# Patient Record
Sex: Female | Born: 1950 | Race: White | Hispanic: No | Smoking: Former smoker
Health system: Southern US, Community
[De-identification: ages and names within clinical notes are randomized; demographics above are authoritative.]

## PROBLEM LIST (undated history)

## (undated) DIAGNOSIS — K219 Gastro-esophageal reflux disease without esophagitis: Secondary | ICD-10-CM

## (undated) DIAGNOSIS — M549 Dorsalgia, unspecified: Secondary | ICD-10-CM

## (undated) DIAGNOSIS — F329 Major depressive disorder, single episode, unspecified: Secondary | ICD-10-CM

## (undated) DIAGNOSIS — T7840XA Allergy, unspecified, initial encounter: Secondary | ICD-10-CM

## (undated) DIAGNOSIS — G8929 Other chronic pain: Secondary | ICD-10-CM

## (undated) DIAGNOSIS — E669 Obesity, unspecified: Secondary | ICD-10-CM

## (undated) DIAGNOSIS — F419 Anxiety disorder, unspecified: Secondary | ICD-10-CM

## (undated) DIAGNOSIS — G4700 Insomnia, unspecified: Secondary | ICD-10-CM

## (undated) DIAGNOSIS — F32A Depression, unspecified: Secondary | ICD-10-CM

## (undated) DIAGNOSIS — E079 Disorder of thyroid, unspecified: Secondary | ICD-10-CM

## (undated) HISTORY — DX: Obesity, unspecified: E66.9

## (undated) HISTORY — PX: COLONOSCOPY: SHX174

## (undated) HISTORY — DX: Anxiety disorder, unspecified: F41.9

## (undated) HISTORY — DX: Dorsalgia, unspecified: M54.9

## (undated) HISTORY — DX: Disorder of thyroid, unspecified: E07.9

## (undated) HISTORY — DX: Other chronic pain: G89.29

## (undated) HISTORY — DX: Major depressive disorder, single episode, unspecified: F32.9

## (undated) HISTORY — DX: Gastro-esophageal reflux disease without esophagitis: K21.9

## (undated) HISTORY — DX: Insomnia, unspecified: G47.00

## (undated) HISTORY — PX: TUBAL LIGATION: SHX77

## (undated) HISTORY — DX: Allergy, unspecified, initial encounter: T78.40XA

## (undated) HISTORY — PX: SMALL INTESTINE SURGERY: SHX150

## (undated) HISTORY — DX: Depression, unspecified: F32.A

---

## 1992-01-24 HISTORY — PX: ABDOMINAL HYSTERECTOMY: SHX81

## 1997-07-22 ENCOUNTER — Other Ambulatory Visit: Admission: RE | Admit: 1997-07-22 | Discharge: 1997-07-22 | Payer: Self-pay | Admitting: Gynecology

## 1998-01-23 HISTORY — PX: GASTRIC BYPASS: SHX52

## 1998-01-23 HISTORY — PX: EYE SURGERY: SHX253

## 1998-01-23 HISTORY — PX: CHOLECYSTECTOMY: SHX55

## 1998-09-21 ENCOUNTER — Other Ambulatory Visit: Admission: RE | Admit: 1998-09-21 | Discharge: 1998-09-21 | Payer: Self-pay | Admitting: Gynecology

## 1999-10-27 ENCOUNTER — Encounter: Payer: Self-pay | Admitting: Gynecology

## 1999-10-27 ENCOUNTER — Encounter: Admission: RE | Admit: 1999-10-27 | Discharge: 1999-10-27 | Payer: Self-pay | Admitting: Gynecology

## 1999-10-27 ENCOUNTER — Other Ambulatory Visit: Admission: RE | Admit: 1999-10-27 | Discharge: 1999-10-27 | Payer: Self-pay | Admitting: Gynecology

## 2000-01-24 HISTORY — PX: EYE SURGERY: SHX253

## 2000-12-14 ENCOUNTER — Other Ambulatory Visit: Admission: RE | Admit: 2000-12-14 | Discharge: 2000-12-14 | Payer: Self-pay | Admitting: Gynecology

## 2001-12-10 ENCOUNTER — Other Ambulatory Visit: Admission: RE | Admit: 2001-12-10 | Discharge: 2001-12-10 | Payer: Self-pay | Admitting: Gynecology

## 2001-12-10 ENCOUNTER — Encounter: Admission: RE | Admit: 2001-12-10 | Discharge: 2001-12-10 | Payer: Self-pay | Admitting: Gynecology

## 2001-12-10 ENCOUNTER — Encounter: Payer: Self-pay | Admitting: Gynecology

## 2002-12-04 ENCOUNTER — Other Ambulatory Visit: Admission: RE | Admit: 2002-12-04 | Discharge: 2002-12-04 | Payer: Self-pay | Admitting: Gynecology

## 2002-12-12 ENCOUNTER — Encounter: Admission: RE | Admit: 2002-12-12 | Discharge: 2002-12-12 | Payer: Self-pay | Admitting: Gynecology

## 2005-01-31 ENCOUNTER — Other Ambulatory Visit: Admission: RE | Admit: 2005-01-31 | Discharge: 2005-01-31 | Payer: Self-pay | Admitting: Gynecology

## 2005-02-20 ENCOUNTER — Encounter: Admission: RE | Admit: 2005-02-20 | Discharge: 2005-02-20 | Payer: Self-pay | Admitting: Gynecology

## 2006-03-06 ENCOUNTER — Encounter: Admission: RE | Admit: 2006-03-06 | Discharge: 2006-03-06 | Payer: Self-pay | Admitting: Gynecology

## 2006-03-12 ENCOUNTER — Other Ambulatory Visit: Admission: RE | Admit: 2006-03-12 | Discharge: 2006-03-12 | Payer: Self-pay | Admitting: Gynecology

## 2007-03-18 ENCOUNTER — Emergency Department: Payer: Self-pay | Admitting: Emergency Medicine

## 2007-05-09 ENCOUNTER — Other Ambulatory Visit: Admission: RE | Admit: 2007-05-09 | Discharge: 2007-05-09 | Payer: Self-pay | Admitting: Gynecology

## 2007-05-09 ENCOUNTER — Encounter: Admission: RE | Admit: 2007-05-09 | Discharge: 2007-05-09 | Payer: Self-pay | Admitting: Gynecology

## 2007-05-21 ENCOUNTER — Encounter: Payer: Self-pay | Admitting: Specialist

## 2007-05-24 ENCOUNTER — Encounter: Payer: Self-pay | Admitting: Specialist

## 2007-06-24 ENCOUNTER — Encounter: Payer: Self-pay | Admitting: Specialist

## 2007-12-27 ENCOUNTER — Ambulatory Visit: Payer: Self-pay | Admitting: Family Medicine

## 2008-01-24 HISTORY — PX: GASTRIC BYPASS: SHX52

## 2008-05-11 ENCOUNTER — Encounter: Admission: RE | Admit: 2008-05-11 | Discharge: 2008-05-11 | Payer: Self-pay | Admitting: Gynecology

## 2009-05-25 ENCOUNTER — Encounter: Admission: RE | Admit: 2009-05-25 | Discharge: 2009-05-25 | Payer: Self-pay | Admitting: Gynecology

## 2009-06-02 ENCOUNTER — Encounter: Admission: RE | Admit: 2009-06-02 | Discharge: 2009-06-02 | Payer: Self-pay | Admitting: Gynecology

## 2010-06-02 LAB — HM DEXA SCAN: HM Dexa Scan: NORMAL

## 2011-12-11 LAB — HEMOGLOBIN A1C: HEMOGLOBIN A1C: 5.4 % (ref 4.0–6.0)

## 2012-01-10 ENCOUNTER — Ambulatory Visit: Payer: Self-pay | Admitting: Family Medicine

## 2012-01-24 ENCOUNTER — Ambulatory Visit: Payer: Self-pay | Admitting: Family Medicine

## 2012-03-19 ENCOUNTER — Ambulatory Visit: Payer: Self-pay | Admitting: Family Medicine

## 2012-06-13 ENCOUNTER — Ambulatory Visit: Payer: Self-pay | Admitting: Family Medicine

## 2012-07-19 ENCOUNTER — Ambulatory Visit: Payer: Self-pay | Admitting: Family Medicine

## 2012-07-31 LAB — HM COLONOSCOPY

## 2012-08-01 ENCOUNTER — Ambulatory Visit: Payer: Self-pay | Admitting: Emergency Medicine

## 2013-07-01 ENCOUNTER — Ambulatory Visit (INDEPENDENT_AMBULATORY_CARE_PROVIDER_SITE_OTHER): Payer: Federal, State, Local not specified - PPO

## 2013-07-01 ENCOUNTER — Encounter: Payer: Self-pay | Admitting: Podiatry

## 2013-07-01 ENCOUNTER — Ambulatory Visit (INDEPENDENT_AMBULATORY_CARE_PROVIDER_SITE_OTHER): Payer: Federal, State, Local not specified - PPO | Admitting: Podiatry

## 2013-07-01 VITALS — BP 102/65 | HR 96 | Resp 16 | Ht 63.0 in | Wt 215.0 lb

## 2013-07-01 DIAGNOSIS — M722 Plantar fascial fibromatosis: Secondary | ICD-10-CM

## 2013-07-01 MED ORDER — DICLOFENAC SODIUM 75 MG PO TBEC: 75.0000 mg | DELAYED_RELEASE_TABLET | Freq: Two times a day (BID) | ORAL | Status: DC

## 2013-07-01 MED ORDER — TRIAMCINOLONE ACETONIDE 10 MG/ML IJ SUSP
10.0000 mg | Freq: Once | INTRAMUSCULAR | Status: AC
  Administered 2013-07-01: 10 mg

## 2013-07-01 NOTE — Patient Instructions (Signed)

## 2013-07-01 NOTE — Progress Notes (Signed)
   Subjective:    Patient ID: Terri Castillo, female    DOB: 1950-01-30, 63 y.o.   MRN: [id number]  HPI Comments: i have heel pain in my right foot and last week i fell and hurt my rt foot. Its still sore. i wore a brace on my foot but it makes the foot hurt more. The heel feels like a stone bruise.its aggravating to walk. i did elevate and put ice on it. The foot has gotten better.   Foot Pain      Review of Systems  Allergic/Immunologic: Positive for environmental allergies.  Hematological: Bruises/bleeds easily.  All other systems reviewed and are negative.      Objective:   Physical Exam        Assessment & Plan:

## 2013-07-01 NOTE — Progress Notes (Signed)
Subjective:     Patient ID: Terri Castillo, female   DOB: [birth year], 63 y.o.   MRN: [id number]  Foot Pain   patient presents stating that she has been having a lot of pain in her right heel and it's been getting gradually worse over the last month and she tripped and hurt the top of her foot but it's not as bad as the heel   Review of Systems  All other systems reviewed and are negative.      Objective:   Physical Exam  Nursing note and vitals reviewed. Constitutional: She is oriented to person, place, and time.  Cardiovascular: Intact distal pulses.   Musculoskeletal: Normal range of motion.  Neurological: She is oriented to person, place, and time.  Skin: Skin is warm.   neurovascular status intact with range of motion of the subtalar midtarsal joint adequate and muscle strength adequate. Patient is found to have discomfort in the plantar right heel at the insertion of the tendon to the calcaneus and mild discomfort dorsum of the foot upon palpation     Assessment:     Plantar fasciitis right with inflammation and mild dorsal tendinitis    Plan:     H&P and x-ray performed. Injected the right plantar fascia 3 mg Kenalog 5 mg Xylocaine Marcaine mixture and dispensed fascial brace with instructions. Placed on diclofenac 75 mg twice a day and reappoint in 2 weeks

## 2013-07-08 ENCOUNTER — Ambulatory Visit (INDEPENDENT_AMBULATORY_CARE_PROVIDER_SITE_OTHER): Payer: Federal, State, Local not specified - PPO | Admitting: Podiatry

## 2013-07-08 VITALS — BP 114/68 | HR 74 | Resp 16 | Ht 63.0 in | Wt 215.0 lb

## 2013-07-08 DIAGNOSIS — M722 Plantar fascial fibromatosis: Secondary | ICD-10-CM

## 2013-07-08 NOTE — Progress Notes (Signed)
Subjective:     Patient ID: Terri SabinsPatricia S Castillo, female   DOB: 1950/08/14, 63 y.o.   MRN: [id number]  HPI patient states the right foot is feeling quite a bit better but still sore with activity but she's able to walk first thing in the morning without intense discomfort   Review of Systems     Objective:   Physical Exam  neurovascular status intact with continued discomfort in the right plantar heel at the insertion to the calcaneus but improved from previous visit    Assessment:     Improving plantar fasciitis right    Plan:     Instructed on continued conservative treatment and recommended long-term orthotics. Patient is scanned for customized orthotic devices at this time to reduce stress against the feet

## 2013-07-15 ENCOUNTER — Encounter: Payer: Self-pay | Admitting: *Deleted

## 2013-07-15 NOTE — Progress Notes (Signed)
Sent pt post card letting her know orthotics are here. 

## 2013-07-18 ENCOUNTER — Encounter: Payer: Self-pay | Admitting: Podiatry

## 2013-09-25 ENCOUNTER — Other Ambulatory Visit: Payer: Self-pay | Admitting: Podiatry

## 2013-11-04 LAB — LIPID PANEL
CHOLESTEROL: 136 mg/dL (ref 0–200)
HDL: 42 mg/dL (ref 35–70)
LDL CALC: 63 mg/dL
Triglycerides: 154 mg/dL (ref 40–160)

## 2013-11-11 ENCOUNTER — Ambulatory Visit: Payer: Self-pay | Admitting: Family Medicine

## 2013-11-11 LAB — HM MAMMOGRAPHY: HM MAMMO: NORMAL

## 2014-02-12 ENCOUNTER — Encounter: Payer: Self-pay | Admitting: Family Medicine

## 2014-02-23 ENCOUNTER — Encounter: Payer: Self-pay | Admitting: Family Medicine

## 2014-03-24 ENCOUNTER — Encounter
Admit: 2014-03-24 | Disposition: A | Discharge status: Home / Self Care | Payer: Self-pay | Attending: Family Medicine | Admitting: Family Medicine

## 2014-04-24 ENCOUNTER — Encounter
Admit: 2014-04-24 | Disposition: A | Discharge status: Home / Self Care | Payer: Self-pay | Attending: Family Medicine | Admitting: Family Medicine

## 2014-07-01 ENCOUNTER — Telehealth: Payer: Self-pay

## 2014-07-01 NOTE — Telephone Encounter (Signed)
Patient call states she has been angry and snappy, wants to go back on lexapro 20mg  and stop everything else?

## 2014-07-01 NOTE — Telephone Encounter (Signed)
Can she come back for medication change?

## 2014-07-01 NOTE — Telephone Encounter (Signed)
Please call patient for appointment for depression medication change

## 2014-07-02 ENCOUNTER — Ambulatory Visit (INDEPENDENT_AMBULATORY_CARE_PROVIDER_SITE_OTHER): Payer: Federal, State, Local not specified - PPO | Admitting: Family Medicine

## 2014-07-02 ENCOUNTER — Encounter: Payer: Self-pay | Admitting: Family Medicine

## 2014-07-02 VITALS — BP 124/82 | HR 88 | Temp 98.6°F | Resp 14 | Ht 63.5 in | Wt 211.5 lb

## 2014-07-02 DIAGNOSIS — J3089 Other allergic rhinitis: Secondary | ICD-10-CM

## 2014-07-02 DIAGNOSIS — N905 Atrophy of vulva: Secondary | ICD-10-CM | POA: Insufficient documentation

## 2014-07-02 DIAGNOSIS — B009 Herpesviral infection, unspecified: Secondary | ICD-10-CM | POA: Insufficient documentation

## 2014-07-02 DIAGNOSIS — F32A Depression, unspecified: Secondary | ICD-10-CM

## 2014-07-02 DIAGNOSIS — M25569 Pain in unspecified knee: Secondary | ICD-10-CM | POA: Insufficient documentation

## 2014-07-02 DIAGNOSIS — M542 Cervicalgia: Secondary | ICD-10-CM | POA: Insufficient documentation

## 2014-07-02 DIAGNOSIS — J302 Other seasonal allergic rhinitis: Secondary | ICD-10-CM | POA: Insufficient documentation

## 2014-07-02 DIAGNOSIS — F329 Major depressive disorder, single episode, unspecified: Secondary | ICD-10-CM

## 2014-07-02 DIAGNOSIS — Z8701 Personal history of pneumonia (recurrent): Secondary | ICD-10-CM | POA: Insufficient documentation

## 2014-07-02 DIAGNOSIS — E559 Vitamin D deficiency, unspecified: Secondary | ICD-10-CM | POA: Insufficient documentation

## 2014-07-02 DIAGNOSIS — L309 Dermatitis, unspecified: Secondary | ICD-10-CM | POA: Insufficient documentation

## 2014-07-02 DIAGNOSIS — E162 Hypoglycemia, unspecified: Secondary | ICD-10-CM | POA: Insufficient documentation

## 2014-07-02 DIAGNOSIS — E538 Deficiency of other specified B group vitamins: Secondary | ICD-10-CM | POA: Insufficient documentation

## 2014-07-02 DIAGNOSIS — A6009 Herpesviral infection of other urogenital tract: Secondary | ICD-10-CM | POA: Insufficient documentation

## 2014-07-02 DIAGNOSIS — IMO0002 Reserved for concepts with insufficient information to code with codable children: Secondary | ICD-10-CM | POA: Insufficient documentation

## 2014-07-02 DIAGNOSIS — Z9884 Bariatric surgery status: Secondary | ICD-10-CM | POA: Insufficient documentation

## 2014-07-02 DIAGNOSIS — D509 Iron deficiency anemia, unspecified: Secondary | ICD-10-CM

## 2014-07-02 DIAGNOSIS — F330 Major depressive disorder, recurrent, mild: Secondary | ICD-10-CM | POA: Insufficient documentation

## 2014-07-02 DIAGNOSIS — G4700 Insomnia, unspecified: Secondary | ICD-10-CM | POA: Insufficient documentation

## 2014-07-02 DIAGNOSIS — L821 Other seborrheic keratosis: Secondary | ICD-10-CM | POA: Insufficient documentation

## 2014-07-02 DIAGNOSIS — E2839 Other primary ovarian failure: Secondary | ICD-10-CM | POA: Insufficient documentation

## 2014-07-02 DIAGNOSIS — E785 Hyperlipidemia, unspecified: Secondary | ICD-10-CM | POA: Insufficient documentation

## 2014-07-02 DIAGNOSIS — L304 Erythema intertrigo: Secondary | ICD-10-CM | POA: Insufficient documentation

## 2014-07-02 DIAGNOSIS — K219 Gastro-esophageal reflux disease without esophagitis: Secondary | ICD-10-CM | POA: Insufficient documentation

## 2014-07-02 DIAGNOSIS — M810 Age-related osteoporosis without current pathological fracture: Secondary | ICD-10-CM | POA: Insufficient documentation

## 2014-07-02 MED ORDER — FERROUS SULFATE 325 (65 FE) MG PO TABS: 325.0000 mg | ORAL_TABLET | Freq: Two times a day (BID) | ORAL | Status: DC

## 2014-07-02 MED ORDER — ESCITALOPRAM OXALATE 20 MG PO TABS: 20.0000 mg | ORAL_TABLET | Freq: Every day | ORAL | Status: DC

## 2014-07-02 MED ORDER — ESCITALOPRAM OXALATE 20 MG PO TABS: 10.0000 mg | ORAL_TABLET | Freq: Every day | ORAL | Status: DC

## 2014-07-02 NOTE — Patient Instructions (Signed)
Iron Deficiency Anemia Anemia is a condition in which there are less red blood cells or hemoglobin in the blood than normal. Hemoglobin is the part of red blood cells that carries oxygen. Iron deficiency anemia is anemia caused by too little iron. It is the most common type of anemia. It may leave you tired and short of breath. CAUSES   Lack of iron in the diet.  Poor absorption of iron, as seen with intestinal disorders.  Intestinal bleeding.  Heavy periods. SIGNS AND SYMPTOMS  Mild anemia may not be noticeable. Symptoms may include:  Fatigue.  Headache.  Pale skin.  Weakness.  Tiredness.  Shortness of breath.  Dizziness.  Cold hands and feet.  Fast or irregular heartbeat. DIAGNOSIS  Diagnosis requires a thorough evaluation and physical exam by your health care provider. Blood tests are generally used to confirm iron deficiency anemia. Additional tests may be done to find the underlying cause of your anemia. These may include:  Testing for blood in the stool (fecal occult blood test).  A procedure to see inside the colon and rectum (colonoscopy).  A procedure to see inside the esophagus and stomach (endoscopy). TREATMENT  Iron deficiency anemia is treated by correcting the cause of the deficiency. Treatment may involve:  Adding iron-rich foods to your diet.  Taking iron supplements. Pregnant or breastfeeding women need to take extra iron because their normal diet usually does not provide the required amount.  Taking vitamins. Vitamin C improves the absorption of iron. Your health care provider may recommend that you take your iron tablets with a glass of orange juice or vitamin C supplement.  Medicines to make heavy menstrual flow lighter.  Surgery. HOME CARE INSTRUCTIONS   Take iron as directed by your health care provider.  If you cannot tolerate taking iron supplements by mouth, talk to your health care provider about taking them through a vein  (intravenously) or an injection into a muscle.  For the best iron absorption, iron supplements should be taken on an empty stomach. If you cannot tolerate them on an empty stomach, you may need to take them with food.  Do not drink milk or take antacids at the same time as your iron supplements. Milk and antacids may interfere with the absorption of iron.  Iron supplements can cause constipation. Make sure to include fiber in your diet to prevent constipation. A stool softener may also be recommended.  Take vitamins as directed by your health care provider.  Eat a diet rich in iron. Foods high in iron include liver, lean beef, whole-grain bread, eggs, dried fruit, and dark green leafy vegetables. SEEK IMMEDIATE MEDICAL CARE IF:   You faint. If this happens, do not drive. Call your local emergency services (911 in [place] if no other help is available.  You have chest pain.  You feel nauseous or vomit.  You have severe or increased shortness of breath with activity.  You feel weak.  You have a rapid heartbeat.  You have unexplained sweating.  You become light-headed when getting up from a chair or bed. MAKE SURE YOU:   Understand these instructions.  Will watch your condition.  Will get help right away if you are not doing well or get worse. Document Released: 01/07/2000 Document Revised: 01/14/2013 Document Reviewed: 09/16/2012 [employer] Patient Information 2015 [employer]. This information is not intended to replace advice given to you by your health care provider. Make sure you discuss any questions you have with your health care provider.  

## 2014-07-02 NOTE — Progress Notes (Signed)
Name: Terri Castillo   MRN: [id number]    DOB: 1950-07-09   Date:07/02/2014       Progress Note  Subjective  Chief Complaint  Chief Complaint  Patient presents with  . Medication Dose Change    1 month F/U  . Depression    Patient is still feeling angry and would like a increase of dose of her Lexapro. Depakote does not help mood and would like to stop and go back up to Lexapro 20 mg daily only.  . Anemia    Patient was trying to give plasma and states her Hemoglobin was too low. 10.4 when checked.    HPI  Anemia: found out incidentally when she tried to donate blood last week, hgb was down to 10.4 - she takes a MVI, but not taking any extra iron supplementation. She has a history of gastric bypass and also has changed her diet since January 2016 trying to lose weight. Colonoscopy is up to date and was normal in 2014. She denies any GI discomfort, and is not even feeling tired. She usually donates blood every 60 days without problems.   Depression: feeling very snappy, Depakote did not help with her mood, she would like to go back on  of Lexapro. She has responded well to that in the past. She states she does not feel sad, denies suicidal thoughts or ideation.    Patient Active Problem List   Diagnosis Date Noted  . 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 B12 deficiency 07/02/2014  . Cervical pain 07/02/2014  . Arthralgia of lower leg 07/02/2014  . Insomnia 07/02/2014  . Clinical depression 07/02/2014  . Dyslipidemia 07/02/2014  . Dermatitis, eczematoid 07/02/2014  . Genital herpes in women 07/02/2014  . Gastric reflux 07/02/2014  . Bariatric surgery status 07/02/2014  . Herpes simplex 07/02/2014  . H/O: pneumonia 07/02/2014  . Hypoglycemia 07/02/2014  . Eczema intertrigo 07/02/2014  . Adult BMI 30+ 07/02/2014  . OP (osteoporosis) 07/02/2014  . 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 Perennial allergic rhinitis with seasonal variation 07/02/2014  . Basal cell papilloma 07/02/2014  .  Avitaminosis D 07/02/2014    History  Substance Use Topics  . Smoking status: Former Smoker -- 0.50 packs/day for 5 years    Types: Cigarettes    Start date: 01/23/1985    Quit date: 01/23/1990  . Smokeless tobacco: Never Used  . Alcohol Use: No     Current outpatient prescriptions:  .  acetaminophen (TYLENOL) 500 MG tablet, Take 1 tablet by mouth 4 (four) times daily as needed., Disp: , Rfl:  .  ALPRAZolam (XANAX) 0.5 MG tablet, , Disp: , Rfl:  .  AMRIX 15 MG 24 hr capsule, , Disp: , Rfl:  .  atorvastatin (LIPITOR) 20 MG tablet, Take 1 tablet by mouth daily., Disp: , Rfl:  .  cetirizine (ZYRTEC) 10 MG tablet, Take 10 mg by mouth daily., Disp: , Rfl:  .  diclofenac (VOLTAREN) 75 MG EC tablet, TAKE 1 TABLET BY MOUTH TWICE A DAY, Disp: 60 tablet, Rfl: 2 .  escitalopram (LEXAPRO) 20 MG tablet, Take 0.5 tablets (10 mg total) by mouth daily., Disp: 30 tablet, Rfl: 1 .  fluticasone (FLONASE) 50 MCG/ACT nasal spray, Place 2 sprays into the nose at bedtime., Disp: , Rfl:  .  omeprazole (PRILOSEC) 20 MG capsule, , Disp: , Rfl:  .  Pediatric Multivitamins-Iron (MULTIPLE VITAMINS-IRON PO), Take 1 tablet by mouth daily., Disp: , Rfl:  .  PREMARIN vaginal cream, Apply 1 application topically  3 (three) times a week., Disp: , Rfl: 12 .  raloxifene (EVISTA) 60 MG tablet, Take 60 mg by mouth daily., Disp: , Rfl:  .  triamcinolone cream (KENALOG) 0.5 %, Apply 1 application topically daily as needed., Disp: , Rfl:  .  valACYclovir (VALTREX) 500 MG tablet, Take 1 tablet by mouth 3 (three) times a week., Disp: , Rfl: 0  Allergies  Allergen Reactions  . Trazodone     Other reaction(s): Dizziness  . Topamax  [Topiramate]     severe depression    ROS  Ten systems reviewed and is negative except as mentioned in HPI  Objective  Filed Vitals:   07/02/14 1028  BP: 124/82  Pulse: 88  Temp: 98.6 F (37 C)  TempSrc: Oral  Resp: 14  Height: 5' 3.5" (1.613 m)  Weight: 211 lb 8 oz (95.936 kg)   SpO2: 98%     Physical Exam  Constitutional: Patient appears well-developed and obese. No distress.  HENT: Head: Normocephalic and atraumatic.  Nose normal. Mouth/Throat: Oropharynx is clear and moist. No oropharyngeal exudate.  Eyes: Conjunctivae and EOM are normal. Pupils are equal, round, and reactive to light. No scleral icterus.  Neck: Normal range of motion. Neck supple. No JVD present. No thyromegaly present.  Cardiovascular: Normal rate, regular rhythm and normal heart sounds.  No murmur heard. No BLE edema. Pulmonary/Chest: Effort normal and breath sounds normal. No respiratory distress. Abdominal: Soft. Bowel sounds are normal, no distension. There is no tenderness. no masses Musculoskeletal: Normal range of motion, no joint effusions. Crepitus with extension of left knee Neurological: he is alert and oriented to person, place, and time. No cranial nerve deficit. Coordination, balance, strength, speech and gait are normal.  Skin: Skin is warm and dry. No rash noted. No erythema.  Psychiatric: Patient has a normal mood and affect. behavior is normal. Judgment and thought content normal.     Assessment & Plan  1. Clinical depression We will resume Lexapro 20mg  , stop Depakote  2. Iron deficiency anemia Found while donating blood last week, we will recheck levels and start supplementation  - Iron - Iron Binding Cap (TIBC) - Ferritin - CBC with Differential

## 2014-07-03 LAB — CBC WITH DIFFERENTIAL/PLATELET
BASOS: 1 %
Basophils Absolute: 0.1 10*3/uL (ref 0.0–0.2)
EOS (ABSOLUTE): 0.4 10*3/uL (ref 0.0–0.4)
Eos: 6 %
HEMATOCRIT: 34 % (ref 34.0–46.6)
Hemoglobin: 10.3 g/dL — ABNORMAL LOW (ref 11.1–15.9)
IMMATURE GRANS (ABS): 0 10*3/uL (ref 0.0–0.1)
IMMATURE GRANULOCYTES: 0 %
LYMPHS: 31 %
Lymphocytes Absolute: 1.8 10*3/uL (ref 0.7–3.1)
MCH: 22.3 pg — ABNORMAL LOW (ref 26.6–33.0)
MCHC: 30.3 g/dL — AB (ref 31.5–35.7)
MCV: 74 fL — ABNORMAL LOW (ref 79–97)
Monocytes Absolute: 0.6 10*3/uL (ref 0.1–0.9)
Monocytes: 10 %
NEUTROS PCT: 52 %
Neutrophils Absolute: 3.1 10*3/uL (ref 1.4–7.0)
PLATELETS: 406 10*3/uL — AB (ref 150–379)
RBC: 4.62 x10E6/uL (ref 3.77–5.28)
RDW: 18.3 % — ABNORMAL HIGH (ref 12.3–15.4)
WBC: 5.9 10*3/uL (ref 3.4–10.8)

## 2014-07-03 LAB — FERRITIN: FERRITIN: 8 ng/mL — AB (ref 15–150)

## 2014-07-03 LAB — IRON AND TIBC
Iron Saturation: 6 % — CL (ref 15–55)
Iron: 27 ug/dL (ref 27–139)
Total Iron Binding Capacity: 451 ug/dL — ABNORMAL HIGH (ref 250–450)
UIBC: 424 ug/dL — AB (ref 118–369)

## 2014-07-06 ENCOUNTER — Telehealth: Payer: Self-pay

## 2014-07-06 DIAGNOSIS — R739 Hyperglycemia, unspecified: Secondary | ICD-10-CM

## 2014-07-06 DIAGNOSIS — E559 Vitamin D deficiency, unspecified: Secondary | ICD-10-CM

## 2014-07-06 DIAGNOSIS — D509 Iron deficiency anemia, unspecified: Secondary | ICD-10-CM

## 2014-07-06 DIAGNOSIS — E785 Hyperlipidemia, unspecified: Secondary | ICD-10-CM

## 2014-07-06 DIAGNOSIS — E538 Deficiency of other specified B group vitamins: Secondary | ICD-10-CM

## 2014-07-06 DIAGNOSIS — Z79899 Other long term (current) drug therapy: Secondary | ICD-10-CM

## 2014-07-06 NOTE — Telephone Encounter (Signed)
Patient notified of labs and states she will start taking the Iron supplement as discuss. Also would like her lab slip sent to her house so she can have it rechecked before her next appointment.

## 2014-07-06 NOTE — Telephone Encounter (Signed)
Printed, ready to be mailed. Thank you

## 2014-07-06 NOTE — Telephone Encounter (Signed)
-----   Message from Alba Cory, MD sent at 07/03/2014  8:03 AM EDT ----- She has iron deficiency anemia, likely from change in diet, malabsorption for gastric bypass surgery and from donating blood every 60 days. Colonoscopy is up to date, take supplementations as discussed during her visit and recheck in about 6 weeks to 2 months to see if improving

## 2014-09-02 ENCOUNTER — Telehealth: Payer: Self-pay

## 2014-09-02 LAB — IRON AND TIBC
IRON: 75 ug/dL (ref 27–139)
Iron Saturation: 20 % (ref 15–55)
Total Iron Binding Capacity: 372 ug/dL (ref 250–450)
UIBC: 297 ug/dL (ref 118–369)

## 2014-09-02 LAB — CBC
HEMOGLOBIN: 13.1 g/dL (ref 11.1–15.9)
Hematocrit: 41.5 % (ref 34.0–46.6)
MCH: 26 pg — ABNORMAL LOW (ref 26.6–33.0)
MCHC: 31.6 g/dL (ref 31.5–35.7)
MCV: 82 fL (ref 79–97)
PLATELETS: 344 10*3/uL (ref 150–379)
RBC: 5.04 x10E6/uL (ref 3.77–5.28)
RDW: 21.8 % — ABNORMAL HIGH (ref 12.3–15.4)
WBC: 4.4 10*3/uL (ref 3.4–10.8)

## 2014-09-02 LAB — FERRITIN: FERRITIN: 21 ng/mL (ref 15–150)

## 2014-09-02 LAB — LIPID PANEL
CHOL/HDL RATIO: 2.7 ratio (ref 0.0–4.4)
Cholesterol, Total: 118 mg/dL (ref 100–199)
HDL: 43 mg/dL (ref >39)
LDL CALC: 56 mg/dL (ref 0–99)
TRIGLYCERIDES: 93 mg/dL (ref 0–149)
VLDL Cholesterol Cal: 19 mg/dL (ref 5–40)

## 2014-09-02 LAB — COMPREHENSIVE METABOLIC PANEL
A/G RATIO: 1.2 (ref 1.1–2.5)
ALT: 20 IU/L (ref 0–32)
AST: 20 IU/L (ref 0–40)
Albumin: 3.8 g/dL (ref 3.6–4.8)
Alkaline Phosphatase: 107 IU/L (ref 39–117)
BUN/Creatinine Ratio: 18 (ref 11–26)
BUN: 13 mg/dL (ref 8–27)
Bilirubin Total: 0.9 mg/dL (ref 0.0–1.2)
CALCIUM: 8.6 mg/dL — AB (ref 8.7–10.3)
CO2: 23 mmol/L (ref 18–29)
CREATININE: 0.72 mg/dL (ref 0.57–1.00)
Chloride: 103 mmol/L (ref 97–108)
GFR, EST AFRICAN AMERICAN: 104 mL/min/{1.73_m2} (ref >59)
GFR, EST NON AFRICAN AMERICAN: 90 mL/min/{1.73_m2} (ref >59)
GLOBULIN, TOTAL: 3.1 g/dL (ref 1.5–4.5)
GLUCOSE: 104 mg/dL — AB (ref 65–99)
Potassium: 4.2 mmol/L (ref 3.5–5.2)
SODIUM: 141 mmol/L (ref 134–144)
TOTAL PROTEIN: 6.9 g/dL (ref 6.0–8.5)

## 2014-09-02 LAB — VITAMIN D 25 HYDROXY (VIT D DEFICIENCY, FRACTURES): VIT D 25 HYDROXY: 25.5 ng/mL — AB (ref 30.0–100.0)

## 2014-09-02 LAB — HEMOGLOBIN A1C: HEMOGLOBIN A1C: 5.3 % (ref 4.8–5.6)

## 2014-09-02 LAB — VITAMIN B12: Vitamin B-12: 405 pg/mL (ref 211–946)

## 2014-09-02 NOTE — Telephone Encounter (Signed)
Left vm for patient to return my call regarding labs.   

## 2014-09-02 NOTE — Telephone Encounter (Signed)
-----   Message from Alba Cory, MD sent at 09/02/2014 12:21 PM EDT ----- Lipid panel is at goal  Sugar , kidney and liver functions are within normal limits Calcium is low , make sure she is taking otc calcium supplementation CBC and B12 within normal limits Ferritin is back to normal levels Vitamin D is good, continue otc supplementation

## 2014-09-03 NOTE — Telephone Encounter (Signed)
Patient notified of labs.   

## 2014-09-03 NOTE — Telephone Encounter (Signed)
Patient notified

## 2014-09-04 ENCOUNTER — Encounter: Payer: Self-pay | Admitting: Family Medicine

## 2014-09-04 ENCOUNTER — Ambulatory Visit (INDEPENDENT_AMBULATORY_CARE_PROVIDER_SITE_OTHER): Payer: Federal, State, Local not specified - PPO | Admitting: Family Medicine

## 2014-09-04 ENCOUNTER — Encounter (INDEPENDENT_AMBULATORY_CARE_PROVIDER_SITE_OTHER): Payer: Self-pay

## 2014-09-04 VITALS — BP 118/68 | HR 100 | Temp 98.4°F | Resp 16 | Ht 64.0 in | Wt 209.5 lb

## 2014-09-04 DIAGNOSIS — E785 Hyperlipidemia, unspecified: Secondary | ICD-10-CM | POA: Diagnosis not present

## 2014-09-04 DIAGNOSIS — J3089 Other allergic rhinitis: Secondary | ICD-10-CM

## 2014-09-04 DIAGNOSIS — D509 Iron deficiency anemia, unspecified: Secondary | ICD-10-CM | POA: Diagnosis not present

## 2014-09-04 DIAGNOSIS — L710 Perioral dermatitis: Secondary | ICD-10-CM

## 2014-09-04 DIAGNOSIS — E668 Other obesity: Secondary | ICD-10-CM | POA: Diagnosis not present

## 2014-09-04 DIAGNOSIS — IMO0002 Reserved for concepts with insufficient information to code with codable children: Secondary | ICD-10-CM

## 2014-09-04 DIAGNOSIS — J309 Allergic rhinitis, unspecified: Secondary | ICD-10-CM

## 2014-09-04 DIAGNOSIS — R739 Hyperglycemia, unspecified: Secondary | ICD-10-CM

## 2014-09-04 DIAGNOSIS — Z23 Encounter for immunization: Secondary | ICD-10-CM

## 2014-09-04 DIAGNOSIS — F329 Major depressive disorder, single episode, unspecified: Secondary | ICD-10-CM

## 2014-09-04 DIAGNOSIS — F32A Depression, unspecified: Secondary | ICD-10-CM

## 2014-09-04 LAB — POCT GLYCOSYLATED HEMOGLOBIN (HGB A1C): Hemoglobin A1C: 5.1

## 2014-09-04 MED ORDER — ATORVASTATIN CALCIUM 20 MG PO TABS: 20.0000 mg | ORAL_TABLET | Freq: Every day | ORAL | Status: DC

## 2014-09-04 MED ORDER — ESCITALOPRAM OXALATE 20 MG PO TABS: 20.0000 mg | ORAL_TABLET | Freq: Every day | ORAL | Status: DC

## 2014-09-04 MED ORDER — CALCIUM CARBONATE-VITAMIN D 500-200 MG-UNIT PO TABS: 1.0000 | ORAL_TABLET | Freq: Two times a day (BID) | ORAL | Status: DC

## 2014-09-04 MED ORDER — FLUTICASONE PROPIONATE 50 MCG/ACT NA SUSP: 2.0000 | Freq: Every day | NASAL | Status: DC

## 2014-09-04 MED ORDER — ALPRAZOLAM 0.5 MG PO TABS: 0.5000 mg | ORAL_TABLET | Freq: Two times a day (BID) | ORAL | Status: DC | PRN

## 2014-09-04 NOTE — Progress Notes (Signed)
Name: Terri Castillo   MRN: [id number]    DOB: 1950/07/11   Date:09/04/2014       Progress Note  Subjective  Chief Complaint  Chief Complaint  Patient presents with  . Depression  . Gastrophageal Reflux  . Allergic Rhinitis   . Hyperlipidemia    HPI  Depression/anxiety: taking Lexapro 20 mg and is more calm, not as snappy, no crying spells, she still has some anhedonia.  Takes Alprazolam prn, sleeping well at night.  Normal appetite  GERD: off Omeprazole for many months and denies reflux symptoms, controlled dietary and life style modifications  AR: she is using nasal steroid and also Cetirizine, she states she has a flare a couple weeks ago with the weather change, but is back to baseline now.  Dyslipidemia: taking Atorvastatin and denies side effects of medication, LDL is at goal.  Hypocalcemia: she has a history of osteoporosis and was advised to start calcium plus D  Iron Deficiency Anemia: taking ferrous sulfate and ferritin is back to normal   Perioral Dermatitis: seen by Dr. Roseanne Reno and is on new medications, topical and also oral antibiotics and symptoms of redness and bumps around her mouth have improved   Patient Active Problem List   Diagnosis Date Noted  . Iron deficiency anemia 09/04/2014  . Hyperglycemia 09/04/2014  . Hypocalcemia 09/04/2014  . Perioral dermatitis 09/04/2014  . 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 B12 deficiency 07/02/2014  . Cervical pain 07/02/2014  . Arthralgia of lower leg 07/02/2014  . Insomnia 07/02/2014  . Clinical depression 07/02/2014  . Dyslipidemia 07/02/2014  . Dermatitis, eczematoid 07/02/2014  . Genital herpes in women 07/02/2014  . Gastric reflux 07/02/2014  . Bariatric surgery status 07/02/2014  . Herpes simplex 07/02/2014  . H/O: pneumonia 07/02/2014  . Hypoglycemia 07/02/2014  . Eczema intertrigo 07/02/2014  . Adult BMI 30+ 07/02/2014  . OP (osteoporosis) 07/02/2014  . 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 Perennial allergic  rhinitis with seasonal variation 07/02/2014  . Basal cell papilloma 07/02/2014  . Vitamin D deficiency 07/02/2014    Past Surgical History  Procedure Laterality Date  . Cholecystectomy  2000  . Eye surgery  2000    Cataract Removal  . Eye surgery  2002    Cataract Removal  . Abdominal hysterectomy  1994  . Gastric bypass  2000    Mini  . Gastric bypass  2010    Family History  Problem Relation Age of Onset  . Dementia Mother   . COPD Mother   . Osteoporosis Mother   . Diabetes Father   . Glaucoma Father   . Parkinson's disease Father   . Glaucoma Brother   . Heart disease Brother   . Heart Problems Brother   . Brain cancer Daughter     Brain Tumor  . Hypothyroidism Daughter     Social History   Social History  . Marital Status: Divorced    Spouse Name: N/A  . Number of Children: N/A  . Years of Education: N/A   Occupational History  . Not on file.   Social History Main Topics  . Smoking status: Former Smoker -- 0.50 packs/day for 5 years    Types: Cigarettes    Start date: 01/23/1985    Quit date: 01/23/1990  . Smokeless tobacco: Never Used  . Alcohol Use: No  . Drug Use: No  . Sexual Activity: Not Currently   Other Topics Concern  . Not on file   Social History Narrative  Current outpatient prescriptions:  .  acetaminophen (TYLENOL) 500 MG tablet, Take 1 tablet by mouth 4 (four) times daily as needed., Disp: , Rfl:  .  ALPRAZolam (XANAX) 0.5 MG tablet, Take 1 tablet (0.5 mg total) by mouth 2 (two) times daily as needed for anxiety., Disp: 60 tablet, Rfl: 1 .  atorvastatin (LIPITOR) 20 MG tablet, Take 1 tablet (20 mg total) by mouth daily., Disp: 90 tablet, Rfl: 4 .  cetirizine (ZYRTEC) 10 MG tablet, Take 10 mg by mouth daily., Disp: , Rfl:  .  Doxycycline Hyclate 50 MG TBEC, Take 1 tablet by mouth 2 (two) times daily., Disp: , Rfl:  .  escitalopram (LEXAPRO) 20 MG tablet, Take 1 tablet (20 mg total) by mouth daily., Disp: 90 tablet, Rfl: 1 .   ferrous sulfate 325 (65 FE) MG tablet, Take 1 tablet (325 mg total) by mouth 2 (two) times daily with a meal., Disp: 60 tablet, Rfl: 3 .  fluticasone (FLONASE) 50 MCG/ACT nasal spray, Place 2 sprays into both nostrils at bedtime., Disp: 48 g, Rfl: 1 .  Pediatric Multivitamins-Iron (MULTIPLE VITAMINS-IRON PO), Take 1 tablet by mouth daily., Disp: , Rfl:  .  PREMARIN vaginal cream, Apply 1 application topically 3 (three) times a week., Disp: , Rfl: 12 .  raloxifene (EVISTA) 60 MG tablet, Take 60 mg by mouth daily., Disp: , Rfl:  .  triamcinolone cream (KENALOG) 0.5 %, Apply 1 application topically daily as needed., Disp: , Rfl:  .  valACYclovir (VALTREX) 500 MG tablet, Take 1 tablet by mouth 3 (three) times a week., Disp: , Rfl: 0  Allergies  Allergen Reactions  . Trazodone     Other reaction(s): Dizziness  . Topamax  [Topiramate]     severe depression     ROS  Constitutional: Negative for fever mild weight change - doing well with life style modification .  Respiratory: Negative for cough and shortness of breath.   Cardiovascular: Negative for chest pain or palpitations.  Gastrointestinal: Negative for abdominal pain, no bowel changes.  Musculoskeletal: Negative for gait problem or joint swelling.  Skin: Negative for rash.  Neurological: Negative for dizziness or headache.  No other specific complaints in a complete review of systems (except as listed in HPI above).  Objective  Filed Vitals:   09/04/14 0827  BP: 118/68  Pulse: 100  Temp: 98.4 F (36.9 C)  TempSrc: Oral  Resp: 16  Height: 5\' 4"  (1.626 m)  Weight: 209 lb 8 oz (95.029 kg)  SpO2: 96%    Body mass index is 35.94 kg/(m^2).  Physical Exam   Constitutional: Patient appears well-developed and well-nourished. Obese  No distress.  HEENT: head atraumatic, normocephalic, pupils equal and reactive to light,  neck supple, throat within normal limits Cardiovascular: Normal rate, regular rhythm and normal heart sounds.   No murmur heard. No BLE edema. Pulmonary/Chest: Effort normal and breath sounds normal. No respiratory distress. Abdominal: Soft.  There is no tenderness. Psychiatric: Patient has a normal mood and affect. behavior is normal. Judgment and thought content normal.  Recent Results (from the past 2160 hour(s))  Iron Binding Cap (TIBC)     Status: Abnormal   Collection Time: 07/02/14 11:26 AM  Result Value Ref Range   Total Iron Binding Capacity 451 (H) 250 - 450 ug/dL   UIBC 604 (H) 540 - 981 ug/dL   Iron 27 27 - 191 ug/dL   Iron Saturation 6 (LL) 15 - 55 %  Ferritin     Status:  Abnormal   Collection Time: 07/02/14 11:26 AM  Result Value Ref Range   Ferritin 8 (L) 15 - 150 ng/mL  CBC with Differential     Status: Abnormal   Collection Time: 07/02/14 11:26 AM  Result Value Ref Range   WBC 5.9 3.4 - 10.8 x10E3/uL   RBC 4.62 3.77 - 5.28 x10E6/uL   Hemoglobin 10.3 (L) 11.1 - 15.9 g/dL   Hematocrit 16.1 09.6 - 46.6 %   MCV 74 (L) 79 - 97 fL   MCH 22.3 (L) 26.6 - 33.0 pg   MCHC 30.3 (L) 31.5 - 35.7 g/dL   RDW 04.5 (H) 40.9 - 81.1 %   Platelets 406 (H) 150 - 379 x10E3/uL   Neutrophils 52 %   Lymphs 31 %   Monocytes 10 %   Eos 6 %   Basos 1 %   Neutrophils Absolute 3.1 1.4 - 7.0 x10E3/uL   Lymphocytes Absolute 1.8 0.7 - 3.1 x10E3/uL   Monocytes Absolute 0.6 0.1 - 0.9 x10E3/uL   EOS (ABSOLUTE) 0.4 0.0 - 0.4 x10E3/uL   Basophils Absolute 0.1 0.0 - 0.2 x10E3/uL   Immature Granulocytes 0 %   Immature Grans (Abs) 0.0 0.0 - 0.1 x10E3/uL  Lipid Profile     Status: None   Collection Time: 09/01/14  8:58 AM  Result Value Ref Range   Cholesterol, Total 118 100 - 199 mg/dL   Triglycerides 93 0 - 149 mg/dL   HDL 43 >91 mg/dL    Comment: According to ATP-III Guidelines, HDL-C >59 mg/dL is considered a negative risk factor for CHD.    VLDL Cholesterol Cal 19 5 - 40 mg/dL   LDL Calculated 56 0 - 99 mg/dL   Chol/HDL Ratio 2.7 0.0 - 4.4 ratio units    Comment:                                    T. Chol/HDL Ratio                                             Men  Women                               1/2 Avg.Risk  3.4    3.3                                   Avg.Risk  5.0    4.4                                2X Avg.Risk  9.6    7.1                                3X Avg.Risk 23.4   11.0   Comprehensive Metabolic Panel (CMET)     Status: Abnormal   Collection Time: 09/01/14  8:58 AM  Result Value Ref Range   Glucose 104 (H) 65 - 99 mg/dL   BUN 13 8 - 27 mg/dL   Creatinine, Ser 4.78 0.57 - 1.00 mg/dL   GFR  calc non Af Amer 90 >59 mL/min/1.73   GFR calc Af Amer 104 >59 mL/min/1.73   BUN/Creatinine Ratio 18 11 - 26   Sodium 141 134 - 144 mmol/L   Potassium 4.2 3.5 - 5.2 mmol/L   Chloride 103 97 - 108 mmol/L   CO2 23 18 - 29 mmol/L   Calcium 8.6 (L) 8.7 - 10.3 mg/dL   Total Protein 6.9 6.0 - 8.5 g/dL   Albumin 3.8 3.6 - 4.8 g/dL   Globulin, Total 3.1 1.5 - 4.5 g/dL   Albumin/Globulin Ratio 1.2 1.1 - 2.5   Bilirubin Total 0.9 0.0 - 1.2 mg/dL   Alkaline Phosphatase 107 39 - 117 IU/L   AST 20 0 - 40 IU/L   ALT 20 0 - 32 IU/L  HgB A1c     Status: None   Collection Time: 09/01/14  8:58 AM  Result Value Ref Range   Hgb A1c MFr Bld 5.3 4.8 - 5.6 %  B12     Status: None   Collection Time: 09/01/14  8:58 AM  Result Value Ref Range   Vitamin B-12 405 211 - 946 pg/mL  CBC     Status: Abnormal   Collection Time: 09/01/14  8:58 AM  Result Value Ref Range   WBC 4.4 3.4 - 10.8 x10E3/uL   RBC 5.04 3.77 - 5.28 x10E6/uL   Hemoglobin 13.1 11.1 - 15.9 g/dL   Hematocrit 16.1 09.6 - 46.6 %   MCV 82 79 - 97 fL   MCH 26.0 (L) 26.6 - 33.0 pg   MCHC 31.6 31.5 - 35.7 g/dL   RDW 04.5 (H) 40.9 - 81.1 %   Platelets 344 150 - 379 x10E3/uL  Iron Binding Cap (TIBC)     Status: None   Collection Time: 09/01/14  8:58 AM  Result Value Ref Range   Total Iron Binding Capacity 372 250 - 450 ug/dL   UIBC 914 782 - 956 ug/dL   Iron 75 27 - 213 ug/dL   Iron Saturation 20 15 - 55 %  Ferritin      Status: None   Collection Time: 09/01/14  8:58 AM  Result Value Ref Range   Ferritin 21 15 - 150 ng/mL  Vitamin D (25 hydroxy)     Status: Abnormal   Collection Time: 09/01/14  8:58 AM  Result Value Ref Range   Vit D, 25-Hydroxy 25.5 (L) 30.0 - 100.0 ng/mL    Comment: Vitamin D deficiency has been defined by the Institute of Medicine and an Endocrine Society practice guideline as a level of serum 25-OH vitamin D less than 20 ng/mL (1,2). The Endocrine Society went on to further define vitamin D insufficiency as a level between 21 and 29 ng/mL (2). 1. IOM (Institute of Medicine). 2010. Dietary reference    intakes for calcium and D. [place]: The    [employer]. 2. Holick MF, Binkley , Bischoff-Ferrari HA, et al.    Evaluation, treatment, and prevention of vitamin D    deficiency: an Endocrine Society clinical practice    guideline. JCEM. 2011 Jul; 96(7):1911-30.      PHQ2/9: Depression screen [facility] 2/9 09/04/2014 07/02/2014  Decreased Interest 0 1  Down, Depressed, Hopeless 0 0  PHQ - 2 Score 0 1    Fall Risk: Fall Risk  09/04/2014 07/02/2014  Falls in the past year? Yes No  Number falls in past yr: 1 -  Injury with Fall? No -    Assessment & Plan  1. Hyperglycemia  - POCT HgB A1C  2. Iron deficiency anemia Continue ferrous sulfate  for one more month  3. Clinical depression Doing better on higher dose of medication  - escitalopram (LEXAPRO) 20 MG tablet; Take 1 tablet (20 mg total) by mouth daily.  Dispense: 90 tablet; Refill: 1 - ALPRAZolam (XANAX) 0.5 MG tablet; Take 1 tablet (0.5 mg total) by mouth 2 (two) times daily as needed for anxiety.  Dispense: 60 tablet; Refill: 1  4. Adult BMI 30+ Continue diet and execise  5. Hypocalcemia Needs to start calcium plus vitamin D  6. Dyslipidemia  - atorvastatin (LIPITOR) 20 MG tablet; Take 1 tablet (20 mg total) by mouth daily.  Dispense: 90 tablet; Refill: 4  7. Perennial allergic  rhinitis  - fluticasone (FLONASE) 50 MCG/ACT nasal spray; Place 2 sprays into both nostrils at bedtime.  Dispense: 48 g; Refill: 1  8. Needs flu shot  - Flu Vaccine QUAD 36+ mos IM  9. Perioral dermatitis Continue follow up with Dr. Roseanne Reno

## 2014-11-08 ENCOUNTER — Other Ambulatory Visit: Payer: Self-pay | Admitting: Family Medicine

## 2014-11-09 ENCOUNTER — Other Ambulatory Visit: Payer: Self-pay

## 2014-11-09 DIAGNOSIS — F329 Major depressive disorder, single episode, unspecified: Secondary | ICD-10-CM

## 2014-11-09 DIAGNOSIS — F32A Depression, unspecified: Secondary | ICD-10-CM

## 2014-11-09 MED ORDER — ESCITALOPRAM OXALATE 20 MG PO TABS: 20.0000 mg | ORAL_TABLET | Freq: Every day | ORAL | Status: DC

## 2014-12-07 ENCOUNTER — Ambulatory Visit (INDEPENDENT_AMBULATORY_CARE_PROVIDER_SITE_OTHER): Payer: Federal, State, Local not specified - PPO | Admitting: Family Medicine

## 2014-12-07 ENCOUNTER — Encounter: Payer: Self-pay | Admitting: Family Medicine

## 2014-12-07 VITALS — BP 118/84 | HR 82 | Temp 98.2°F | Resp 18 | Ht 62.0 in | Wt 205.8 lb

## 2014-12-07 DIAGNOSIS — E785 Hyperlipidemia, unspecified: Secondary | ICD-10-CM | POA: Diagnosis not present

## 2014-12-07 DIAGNOSIS — F329 Major depressive disorder, single episode, unspecified: Secondary | ICD-10-CM | POA: Diagnosis not present

## 2014-12-07 DIAGNOSIS — M810 Age-related osteoporosis without current pathological fracture: Secondary | ICD-10-CM | POA: Diagnosis not present

## 2014-12-07 DIAGNOSIS — D509 Iron deficiency anemia, unspecified: Secondary | ICD-10-CM

## 2014-12-07 DIAGNOSIS — Z79899 Other long term (current) drug therapy: Secondary | ICD-10-CM | POA: Diagnosis not present

## 2014-12-07 DIAGNOSIS — K219 Gastro-esophageal reflux disease without esophagitis: Secondary | ICD-10-CM

## 2014-12-07 DIAGNOSIS — R739 Hyperglycemia, unspecified: Secondary | ICD-10-CM

## 2014-12-07 DIAGNOSIS — F32A Depression, unspecified: Secondary | ICD-10-CM

## 2014-12-07 DIAGNOSIS — Z9884 Bariatric surgery status: Secondary | ICD-10-CM

## 2014-12-07 DIAGNOSIS — F330 Major depressive disorder, recurrent, mild: Secondary | ICD-10-CM

## 2014-12-07 DIAGNOSIS — E559 Vitamin D deficiency, unspecified: Secondary | ICD-10-CM | POA: Diagnosis not present

## 2014-12-07 MED ORDER — ALPRAZOLAM 0.5 MG PO TABS: 0.5000 mg | ORAL_TABLET | Freq: Two times a day (BID) | ORAL | Status: DC | PRN

## 2014-12-07 MED ORDER — PANTOPRAZOLE SODIUM 40 MG PO TBEC: 40.0000 mg | DELAYED_RELEASE_TABLET | Freq: Every day | ORAL | Status: DC

## 2014-12-07 MED ORDER — DULOXETINE HCL 30 MG PO CPEP: 30.0000 mg | ORAL_CAPSULE | Freq: Every day | ORAL | Status: DC

## 2014-12-07 MED ORDER — RALOXIFENE HCL 60 MG PO TABS: 60.0000 mg | ORAL_TABLET | Freq: Every day | ORAL | Status: DC

## 2014-12-07 NOTE — Progress Notes (Signed)
Name: Terri Castillo   MRN: [id number]    DOB: 1950-10-02   Date:12/07/2014       Progress Note  Subjective  Chief Complaint  Chief Complaint  Patient presents with  . Medication Refill    follow-up  . Depression  . Hyperlipidemia  . Gastroesophageal Reflux    wants to be on something having upset stomach and heart burn    HPI  Depression Mild Recurrent: she is currently taking Lexapro and Alprazolam prn. Her daughter is concerned about possible side effect of Lexapro causing Meningioma and would like to have the medication changed. Terri Hashimotoatricia has failed Prozac, Wellbutrin , currently on Lexapro. Discussed possible side effects and we will try Duloxetine. She has anhedonia, worries but excited about her daughter and grandson moving out of her house  in December. Normal appetite, she states sleep has been good, takes Melatonin prn when she naps during the day.   Hypocalcemia: currently taking calcium plus D, no muscle pain  Osteoporosis: she took Forteo for 2 years, currently on Evista, we will recheck bone density  Iron deficiency anemia: colonoscopy is up to date, history of bariatric surgery, ferritin was back to normal but she stopped supplementation, we will recheck labs. She has fatigue, SOB with activity.   GERD: she would like to resume Pantoprazole to control her heartburn , no regurgitation, No blood in stools, states very seldom has indigestion.   Patient Active Problem List   Diagnosis Date Noted  . Iron deficiency anemia 09/04/2014  . Hyperglycemia 09/04/2014  . Hypocalcemia 09/04/2014  . Perioral dermatitis 09/04/2014  . 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 B12 deficiency 07/02/2014  . Cervical pain 07/02/2014  . Arthralgia of lower leg 07/02/2014  . Insomnia 07/02/2014  . 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 Dyslipidemia 07/02/2014  . Dermatitis, eczematoid 07/02/2014  . Genital herpes in women 07/02/2014  . Gastric reflux 07/02/2014  . Bariatric  surgery status 07/02/2014  . Herpes simplex 07/02/2014  . H/O: pneumonia 07/02/2014  . Hypoglycemia 07/02/2014  . Eczema intertrigo 07/02/2014  . Adult BMI 30+ 07/02/2014  . OP (osteoporosis) 07/02/2014  . 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 Perennial allergic rhinitis with seasonal variation 07/02/2014  . Basal cell papilloma 07/02/2014  . Vitamin D deficiency 07/02/2014    Past Surgical History  Procedure Laterality Date  . Cholecystectomy  2000  . Eye surgery  2000    Cataract Removal  . Eye surgery  2002    Cataract Removal  . Abdominal hysterectomy  1994  . Gastric bypass  2000    Mini  . Gastric bypass  2010    Family History  Problem Relation Age of Onset  . Dementia Mother   . COPD Mother   . Osteoporosis Mother   . Diabetes Father   . Glaucoma Father   . Parkinson's disease Father   . Glaucoma Brother   . Heart disease Brother   . Heart Problems Brother   . Brain cancer Daughter     Brain Tumor  . Hypothyroidism Daughter     Social History   Social History  . Marital Status: Divorced    Spouse Name: N/A  . Number of Children: N/A  . Years of Education: N/A   Occupational History  . Not on file.   Social History Main Topics  . Smoking status: Former Smoker -- 0.50 packs/day for 5 years    Types: Cigarettes    Start date: 01/23/1985    Quit date: 01/23/1990  .  Smokeless tobacco: Never Used  . Alcohol Use: No  . Drug Use: No  . Sexual Activity: Not Currently   Other Topics Concern  . Not on file   Social History Narrative     Current outpatient prescriptions:  .  acetaminophen (TYLENOL) 500 MG tablet, Take 1 tablet by mouth 4 (four) times daily as needed., Disp: , Rfl:  .  ALPRAZolam (XANAX) 0.5 MG tablet, Take 1 tablet (0.5 mg total) by mouth 2 (two) times daily as needed for anxiety., Disp: 60 tablet, Rfl: 1 .  atorvastatin (LIPITOR) 20 MG tablet, Take 1 tablet (20 mg total) by mouth daily., Disp: 90 tablet, Rfl: 4 .  calcium-vitamin D  (OSCAL WITH D) 500-200 MG-UNIT per tablet, Take 1 tablet by mouth 2 (two) times daily., Disp: 60 tablet, Rfl: 0 .  cetirizine (ZYRTEC) 10 MG tablet, Take 10 mg by mouth daily., Disp: , Rfl:  .  DULoxetine (CYMBALTA) 30 MG capsule, Take 1 capsule (30 mg total) by mouth daily. Start with one cap daily for the first week and after that 2 daily, Disp: 60 capsule, Rfl: 0 .  fluticasone (FLONASE) 50 MCG/ACT nasal spray, Place 2 sprays into both nostrils at bedtime., Disp: 48 g, Rfl: 1 .  Pediatric Multivitamins-Iron (MULTIPLE VITAMINS-IRON PO), Take 1 tablet by mouth daily., Disp: , Rfl:  .  PREMARIN vaginal cream, Apply 1 application topically 3 (three) times a week., Disp: , Rfl: 12 .  raloxifene (EVISTA) 60 MG tablet, Take 1 tablet (60 mg total) by mouth daily., Disp: 90 tablet, Rfl: 4 .  triamcinolone cream (KENALOG) 0.5 %, Apply 1 application topically daily as needed., Disp: , Rfl:  .  valACYclovir (VALTREX) 500 MG tablet, Take 1 tablet by mouth 3 (three) times a week., Disp: , Rfl: 0  Allergies  Allergen Reactions  . Trazodone     Other reaction(s): Dizziness  . Topamax  [Topiramate]     severe depression     ROS  Constitutional: Negative for fever, mild  weight change.  Respiratory: Positive  for cough in am's from post-nasal drainage , but has shortness of breath with activity .   Cardiovascular: Negative for chest pain or palpitations.  Gastrointestinal: Negative for abdominal pain, no bowel changes.  Musculoskeletal: Negative for gait problem or joint swelling.  Skin: Negative for rash.  Neurological: Negative for dizziness or headache.  No other specific complaints in a complete review of systems (except as listed in HPI above).  Objective  Filed Vitals:   12/07/14 0829  BP: 118/84  Pulse: 82  Temp: 98.2 F (36.8 C)  TempSrc: Oral  Resp: 18  Height:  (1.575 m)  Weight: 205 lb 12.8 oz (93.35 kg)  SpO2: 98%    Body mass index is 37.63 kg/(m^2).  Physical  Exam  Constitutional: Patient appears well-developed and well-nourished. Obese No distress.  HEENT: head atraumatic, normocephalic, pupils equal and reactive to light neck supple, throat within normal limits Cardiovascular: Normal rate, regular rhythm and normal heart sounds.  No murmur heard. No BLE edema. Pulmonary/Chest: Effort normal and breath sounds normal. No respiratory distress. Abdominal: Soft.  There is no tenderness. Psychiatric: Patient has a normal mood and affect. behavior is normal. Judgment and thought content normal.   PHQ2/9:  Depression screen [facility] 2/9 12/07/2014 09/04/2014 07/02/2014  Decreased Interest 0 0 1  Down, Depressed, Hopeless 0 0 0  PHQ - 2 Score 0 0 1     Fall Risk: Fall Risk  12/07/2014 09/04/2014  07/02/2014  Falls in the past year? Yes Yes No  Number falls in past yr: 1 1 -  Injury with Fall? No No -     Functional Status Survey: Is the patient deaf or have difficulty hearing?: No Does the patient have difficulty seeing, even when wearing glasses/contacts?: Yes (glasses) Does the patient have difficulty concentrating, remembering, or making decisions?: No Does the patient have difficulty walking or climbing stairs?: No Does the patient have difficulty dressing or bathing?: No Does the patient have difficulty doing errands alone such as visiting a doctor's office or shopping?: No    Assessment & Plan  1. Depression, major, recurrent, mild (HCC)  We will try Duloxetine, wean off Lexapro  - ALPRAZolam (XANAX) 0.5 MG tablet; Take 1 tablet (0.5 mg total) by mouth 2 (two) times daily as needed for anxiety.  Dispense: 60 tablet; Refill: 1 - DULoxetine (CYMBALTA) 30 MG capsule; Take 1 capsule (30 mg total) by mouth daily. Start with one cap daily for the first week and after that 2 daily  Dispense: 60 capsule; Refill: 0  2. OP (osteoporosis)  -Bone Density  - raloxifene (EVISTA) 60 MG tablet; Take 1 tablet (60 mg total) by mouth daily.  Dispense: 90  tablet; Refill: 4  3. Gastric reflux  -Pantoprazole 40 mg daily #90 and one refill, explained the risk of decrease in absorption of calcium and worsening of osteoporosis, she will try prn  4. Dyslipidemia  Continue life style medication   5. Bariatric surgery status  Continue good work   6. Iron deficiency anemia  - CBC with Differential/Platelet - Ferritin  7. Vitamin D deficiency  Resume at least 1000 mg daily   8. Hyperglycemia  Doing well, reviewed labs with patient   9. Hypocalcemia  Recheck level, taking supplementation now  10. Long-term use of high-risk medication  - Basic metabolic panel; Future

## 2014-12-08 LAB — CBC WITH DIFFERENTIAL/PLATELET
BASOS: 1 %
Basophils Absolute: 0 10*3/uL (ref 0.0–0.2)
EOS (ABSOLUTE): 0.3 10*3/uL (ref 0.0–0.4)
EOS: 5 %
Hematocrit: 45.1 % (ref 34.0–46.6)
Hemoglobin: 14.4 g/dL (ref 11.1–15.9)
IMMATURE GRANS (ABS): 0 10*3/uL (ref 0.0–0.1)
IMMATURE GRANULOCYTES: 0 %
LYMPHS: 35 %
Lymphocytes Absolute: 1.8 10*3/uL (ref 0.7–3.1)
MCH: 29.4 pg (ref 26.6–33.0)
MCHC: 31.9 g/dL (ref 31.5–35.7)
MCV: 92 fL (ref 79–97)
MONOS ABS: 0.4 10*3/uL (ref 0.1–0.9)
Monocytes: 7 %
NEUTROS PCT: 52 %
Neutrophils Absolute: 2.6 10*3/uL (ref 1.4–7.0)
Platelets: 333 10*3/uL (ref 150–379)
RBC: 4.89 x10E6/uL (ref 3.77–5.28)
RDW: 15.5 % — AB (ref 12.3–15.4)
WBC: 5 10*3/uL (ref 3.4–10.8)

## 2014-12-08 LAB — FERRITIN: FERRITIN: 33 ng/mL (ref 15–150)

## 2014-12-14 ENCOUNTER — Telehealth: Payer: Self-pay

## 2014-12-14 NOTE — Telephone Encounter (Signed)
When calling the patient to inform her of her lab results I was able to give the patient the results of her Ferritin and CBC. But the BMP pending, the patient just called back today asking for the results of the Basic Metabolic Panel. But I called [facility] and this was not her original lab requisition and they only hold the blood for 7 days. So the patient wanted to know when did you want her to have the BMP done since it was ordered as a future lab. Can she have it done on her next visit or does she need to have it drawn sooner?

## 2014-12-16 ENCOUNTER — Ambulatory Visit: Payer: Self-pay

## 2014-12-16 NOTE — Telephone Encounter (Signed)
Next week is fine

## 2014-12-23 ENCOUNTER — Ambulatory Visit: Payer: Self-pay

## 2014-12-31 ENCOUNTER — Ambulatory Visit: Payer: Self-pay | Attending: Family Medicine

## 2015-01-28 ENCOUNTER — Other Ambulatory Visit: Payer: Self-pay

## 2015-01-28 DIAGNOSIS — F330 Major depressive disorder, recurrent, mild: Secondary | ICD-10-CM

## 2015-01-28 MED ORDER — DULOXETINE HCL 60 MG PO CPEP: 60.0000 mg | ORAL_CAPSULE | Freq: Every day | ORAL | Status: DC

## 2015-01-28 NOTE — Telephone Encounter (Signed)
Patient called stating that the Cymbalta is working and that she would like a 90 day supply to be sent to [facility] [address] .  Refill request was sent to Dr. Alba CoryKrichna Sowles for approval and submission.

## 2015-03-05 ENCOUNTER — Encounter: Payer: Self-pay | Admitting: Family Medicine

## 2015-03-05 ENCOUNTER — Ambulatory Visit (INDEPENDENT_AMBULATORY_CARE_PROVIDER_SITE_OTHER): Payer: Federal, State, Local not specified - PPO | Admitting: Family Medicine

## 2015-03-05 VITALS — BP 106/64 | HR 80 | Temp 97.5°F | Resp 18 | Ht 63.0 in | Wt 204.1 lb

## 2015-03-05 DIAGNOSIS — E538 Deficiency of other specified B group vitamins: Secondary | ICD-10-CM | POA: Diagnosis not present

## 2015-03-05 DIAGNOSIS — Z9884 Bariatric surgery status: Secondary | ICD-10-CM

## 2015-03-05 DIAGNOSIS — G4700 Insomnia, unspecified: Secondary | ICD-10-CM

## 2015-03-05 DIAGNOSIS — E785 Hyperlipidemia, unspecified: Secondary | ICD-10-CM | POA: Diagnosis not present

## 2015-03-05 DIAGNOSIS — K219 Gastro-esophageal reflux disease without esophagitis: Secondary | ICD-10-CM | POA: Diagnosis not present

## 2015-03-05 DIAGNOSIS — F330 Major depressive disorder, recurrent, mild: Secondary | ICD-10-CM | POA: Diagnosis not present

## 2015-03-05 MED ORDER — CYANOCOBALAMIN 1000 MCG/ML IJ SOLN
1000.0000 ug | Freq: Once | INTRAMUSCULAR | Status: AC
  Administered 2015-03-05: 1000 ug via INTRAMUSCULAR

## 2015-03-05 MED ORDER — ALPRAZOLAM 0.5 MG PO TABS: 0.5000 mg | ORAL_TABLET | Freq: Two times a day (BID) | ORAL | Status: DC | PRN

## 2015-03-05 MED ORDER — DULOXETINE HCL 60 MG PO CPEP: 60.0000 mg | ORAL_CAPSULE | Freq: Every day | ORAL | Status: DC

## 2015-03-05 NOTE — Progress Notes (Signed)
Name: Terri Castillo   MRN: [id number]    DOB: Aug 04, 1950   Date:03/05/2015       Progress Note  Subjective  Chief Complaint  Chief Complaint  Patient presents with  . Medication Refill  . Depression    Taking Cymbalta one tablet daily, less stress since daughter has moved out.  . Gastroesophageal Reflux    Recent flairs up, belching frequently after meals  . Hyperlipidemia    Muscle cramps in bilateral feet and legs so patient has started winging her self off of the medication    HPI  Depression Mild Recurrent: she is currently taking Cymbalta and Alprazolam prn. She thinks her mood is more melow, not as irritable. Terri Castillo has failed Prozac, Wellbutrin and changed from Lexapro because daughter was worried about meningioma.  She has anhedonia, adjusting well since her daughter moved out of her house. Normal appetite, she states sleep has been good, takes Melatonin prn and also Alprazolam qhs.   Hypocalcemia: currently taking calcium plus D, no muscle pain  Osteoporosis: she took Forteo for 2 years, currently on Evista, we ordered a Bone Density but she missed appointment  Iron deficiency anemia: colonoscopy is up to date, history of bariatric surgery, ferritin is back to normal, but explained importance of resuming supplementation  Hyperlipidemia: last labs last year were at goal, but she has stopped taking statins. She has changed her diet and would like to recheck without medication on her next visit. No chest pain or decrease in exercise tolerance.   GERD: she is back on  Pantoprazole to control her heartburn , doing better but has bloating when she skips medications, No blood in stools, states very seldom has indigestion.   History of bariatric surgery: she had gastric bypass in 2010, her lowest weight was 180 lbs, but has gradually gained weight since. She has eliminated sugar and is feeling better, lost 1 lbs since last visit.   Patient Active Problem List   Diagnosis  Date Noted  . Iron deficiency anemia 09/04/2014  . Hyperglycemia 09/04/2014  . Hypocalcemia 09/04/2014  . Perioral dermatitis 09/04/2014  . 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 B12 deficiency 07/02/2014  . Cervical pain 07/02/2014  . Arthralgia of lower leg 07/02/2014  . Insomnia 07/02/2014  . 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 Dyslipidemia 07/02/2014  . Dermatitis, eczematoid 07/02/2014  . Genital herpes in women 07/02/2014  . Gastric reflux 07/02/2014  . Bariatric surgery status 07/02/2014  . Herpes simplex 07/02/2014  . H/O: pneumonia 07/02/2014  . Hypoglycemia 07/02/2014  . Eczema intertrigo 07/02/2014  . Adult BMI 30+ 07/02/2014  . OP (osteoporosis) 07/02/2014  . 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 Perennial allergic rhinitis with seasonal variation 07/02/2014  . Basal cell papilloma 07/02/2014  . Vitamin D deficiency 07/02/2014    Past Surgical History  Procedure Laterality Date  . Cholecystectomy  2000  . Eye surgery  2000    Cataract Removal  . Eye surgery  2002    Cataract Removal  . Abdominal hysterectomy  1994  . Gastric bypass  2000    Mini  . Gastric bypass  2010    Family History  Problem Relation Age of Onset  . Dementia Mother   . COPD Mother   . Osteoporosis Mother   . Diabetes Father   . Glaucoma Father   . Parkinson's disease Father   . Glaucoma Brother   . Heart disease Brother   . Heart Problems Brother   . Brain cancer Daughter  Brain Tumor  . Hypothyroidism Daughter     Social History   Social History  . Marital Status: Divorced    Spouse Name: N/A  . Number of Children: N/A  . Years of Education: N/A   Occupational History  . Not on file.   Social History Main Topics  . Smoking status: Former Smoker -- 0.50 packs/day for 5 years    Types: Cigarettes    Start date: 01/23/1985    Quit date: 01/23/1990  . Smokeless tobacco: Never Used  . Alcohol Use: No  . Drug Use: No  . Sexual Activity: Not Currently   Other  Topics Concern  . Not on file   Social History Narrative     Current outpatient prescriptions:  .  acetaminophen (TYLENOL) 500 MG tablet, Take 1 tablet by mouth 4 (four) times daily as needed., Disp: , Rfl:  .  ALPRAZolam (XANAX) 0.5 MG tablet, Take 1 tablet (0.5 mg total) by mouth 2 (two) times daily as needed for anxiety., Disp: 60 tablet, Rfl: 1 .  calcium-vitamin D (OSCAL WITH D) 500-200 MG-UNIT per tablet, Take 1 tablet by mouth 2 (two) times daily., Disp: 60 tablet, Rfl: 0 .  cetirizine (ZYRTEC) 10 MG tablet, Take 10 mg by mouth daily., Disp: , Rfl:  .  DULoxetine (CYMBALTA) 60 MG capsule, Take 1 capsule (60 mg total) by mouth daily., Disp: 90 capsule, Rfl: 0 .  fluticasone (FLONASE) 50 MCG/ACT nasal spray, Place 2 sprays into both nostrils at bedtime., Disp: 48 g, Rfl: 1 .  pantoprazole (PROTONIX) 40 MG tablet, Take 1 tablet (40 mg total) by mouth daily., Disp: 90 tablet, Rfl: 1 .  Pediatric Multivitamins-Iron (MULTIPLE VITAMINS-IRON PO), Take 1 tablet by mouth daily., Disp: , Rfl:  .  PREMARIN vaginal cream, Apply 1 application topically 3 (three) times a week., Disp: , Rfl: 12 .  raloxifene (EVISTA) 60 MG tablet, Take 1 tablet (60 mg total) by mouth daily., Disp: 90 tablet, Rfl: 4 .  triamcinolone cream (KENALOG) 0.5 %, Apply 1 application topically daily as needed., Disp: , Rfl:  .  valACYclovir (VALTREX) 500 MG tablet, Take 1 tablet by mouth 3 (three) times a week., Disp: , Rfl: 0  Current facility-administered medications:  .  cyanocobalamin ((VITAMIN B-12)) injection 1,000 mcg, 1,000 mcg, Intramuscular, Once, Alba Cory, MD  Allergies  Allergen Reactions  . Trazodone     Other reaction(s): Dizziness  . Topamax  [Topiramate]     severe depression     ROS  Constitutional: Negative for fever or weight change.  Respiratory: Negative for cough and shortness of breath.   Cardiovascular: Negative for chest pain or palpitations.  Gastrointestinal: Negative for abdominal  pain, no bowel changes.  Musculoskeletal: Negative for gait problem or joint swelling.  Skin: Negative for rash.  Neurological: Negative for dizziness or headache.  No other specific complaints in a complete review of systems (except as listed in HPI above).  Objective  Filed Vitals:   03/05/15 1325  BP: 106/64  Pulse: 80  Temp: 97.5 F (36.4 C)  TempSrc: Oral  Resp: 18  Height:  (1.6 m)  Weight: 204 lb 1.6 oz (92.579 kg)  SpO2: 95%    Body mass index is 36.16 kg/(m^2).  Physical Exam  Constitutional: Patient appears well-developed and well-nourished. Obese  No distress.  HEENT: head atraumatic, normocephalic, pupils equal and reactive to light, neck supple, throat within normal limits Cardiovascular: Normal rate, regular rhythm and normal heart sounds.  No murmur  heard. No BLE edema. Pulmonary/Chest: Effort normal and breath sounds normal. No respiratory distress. Abdominal: Soft.  There is no tenderness. Psychiatric: Patient has a normal mood and affect. behavior is normal. Judgment and thought content normal.  Recent Results (from the past 2160 hour(s))  CBC with Differential/Platelet     Status: Abnormal   Collection Time: 12/07/14  9:24 AM  Result Value Ref Range   WBC 5.0 3.4 - 10.8 x10E3/uL   RBC 4.89 3.77 - 5.28 x10E6/uL   Hemoglobin 14.4 11.1 - 15.9 g/dL   Hematocrit 16.1 09.6 - 46.6 %   MCV 92 79 - 97 fL   MCH 29.4 26.6 - 33.0 pg   MCHC 31.9 31.5 - 35.7 g/dL   RDW 04.5 (H) 40.9 - 81.1 %   Platelets 333 150 - 379 x10E3/uL   Neutrophils 52 %   Lymphs 35 %   Monocytes 7 %   Eos 5 %   Basos 1 %   Neutrophils Absolute 2.6 1.4 - 7.0 x10E3/uL   Lymphocytes Absolute 1.8 0.7 - 3.1 x10E3/uL   Monocytes Absolute 0.4 0.1 - 0.9 x10E3/uL   EOS (ABSOLUTE) 0.3 0.0 - 0.4 x10E3/uL   Basophils Absolute 0.0 0.0 - 0.2 x10E3/uL   Immature Granulocytes 0 %   Immature Grans (Abs) 0.0 0.0 - 0.1 x10E3/uL  Ferritin     Status: None   Collection Time: 12/07/14  9:24 AM   Result Value Ref Range   Ferritin 33 15 - 150 ng/mL    PHQ2/9: Depression screen [facility] 2/9 03/05/2015 12/07/2014 09/04/2014 07/02/2014  Decreased Interest 0 0 0 1  Down, Depressed, Hopeless 0 0 0 0  PHQ - 2 Score 0 0 0 1     Fall Risk: Fall Risk  03/05/2015 12/07/2014 09/04/2014 07/02/2014  Falls in the past year? Yes Yes Yes No  Number falls in past yr: -  Injury with Fall? No No No -     Functional Status Survey: Is the patient deaf or have difficulty hearing?: No Does the patient have difficulty seeing, even when wearing glasses/contacts?: No Does the patient have difficulty concentrating, remembering, or making decisions?: No Does the patient have difficulty walking or climbing stairs?: No Does the patient have difficulty dressing or bathing?: No Does the patient have difficulty doing errands alone such as visiting a doctor's office or shopping?: No   Assessment & Plan  1. Depression, major, recurrent, mild (HCC)  Doing well, discussed daily exercise - ALPRAZolam (XANAX) 0.5 MG tablet; Take 1 tablet (0.5 mg total) by mouth 2 (two) times daily as needed for anxiety.  Dispense: 60 tablet; Refill: 1 - DULoxetine (CYMBALTA) 60 MG capsule; Take 1 capsule (60 mg total) by mouth daily.  Dispense: 90 capsule; Refill: 0  2. B12 deficiency  - cyanocobalamin ((VITAMIN B-12)) injection 1,000 mcg; Inject 1 mL (1,000 mcg total) into the muscle once.  3. Insomnia  Continue prn Alprazolam   4. Dyslipidemia  She is off medication, we will recheck labs next visit  5. Bariatric surgery status  Discussed importance of supplementation

## 2015-03-17 IMAGING — CR DG CHEST 2V
1 series · 2 of 2 positions shown · non-contrast
Comparison: none

REASON FOR EXAM: wheezing
COMMENTS:

PROCEDURE:     KDR - KDXR CHEST PA (OR AP) AND LAT  - June 13, 2012  [DATE]
RESULT:     Comparison: 03/19/2012

[Series 1: pa · 0.17mm/px · 2 of 2 slices shown]
[im 1/2]
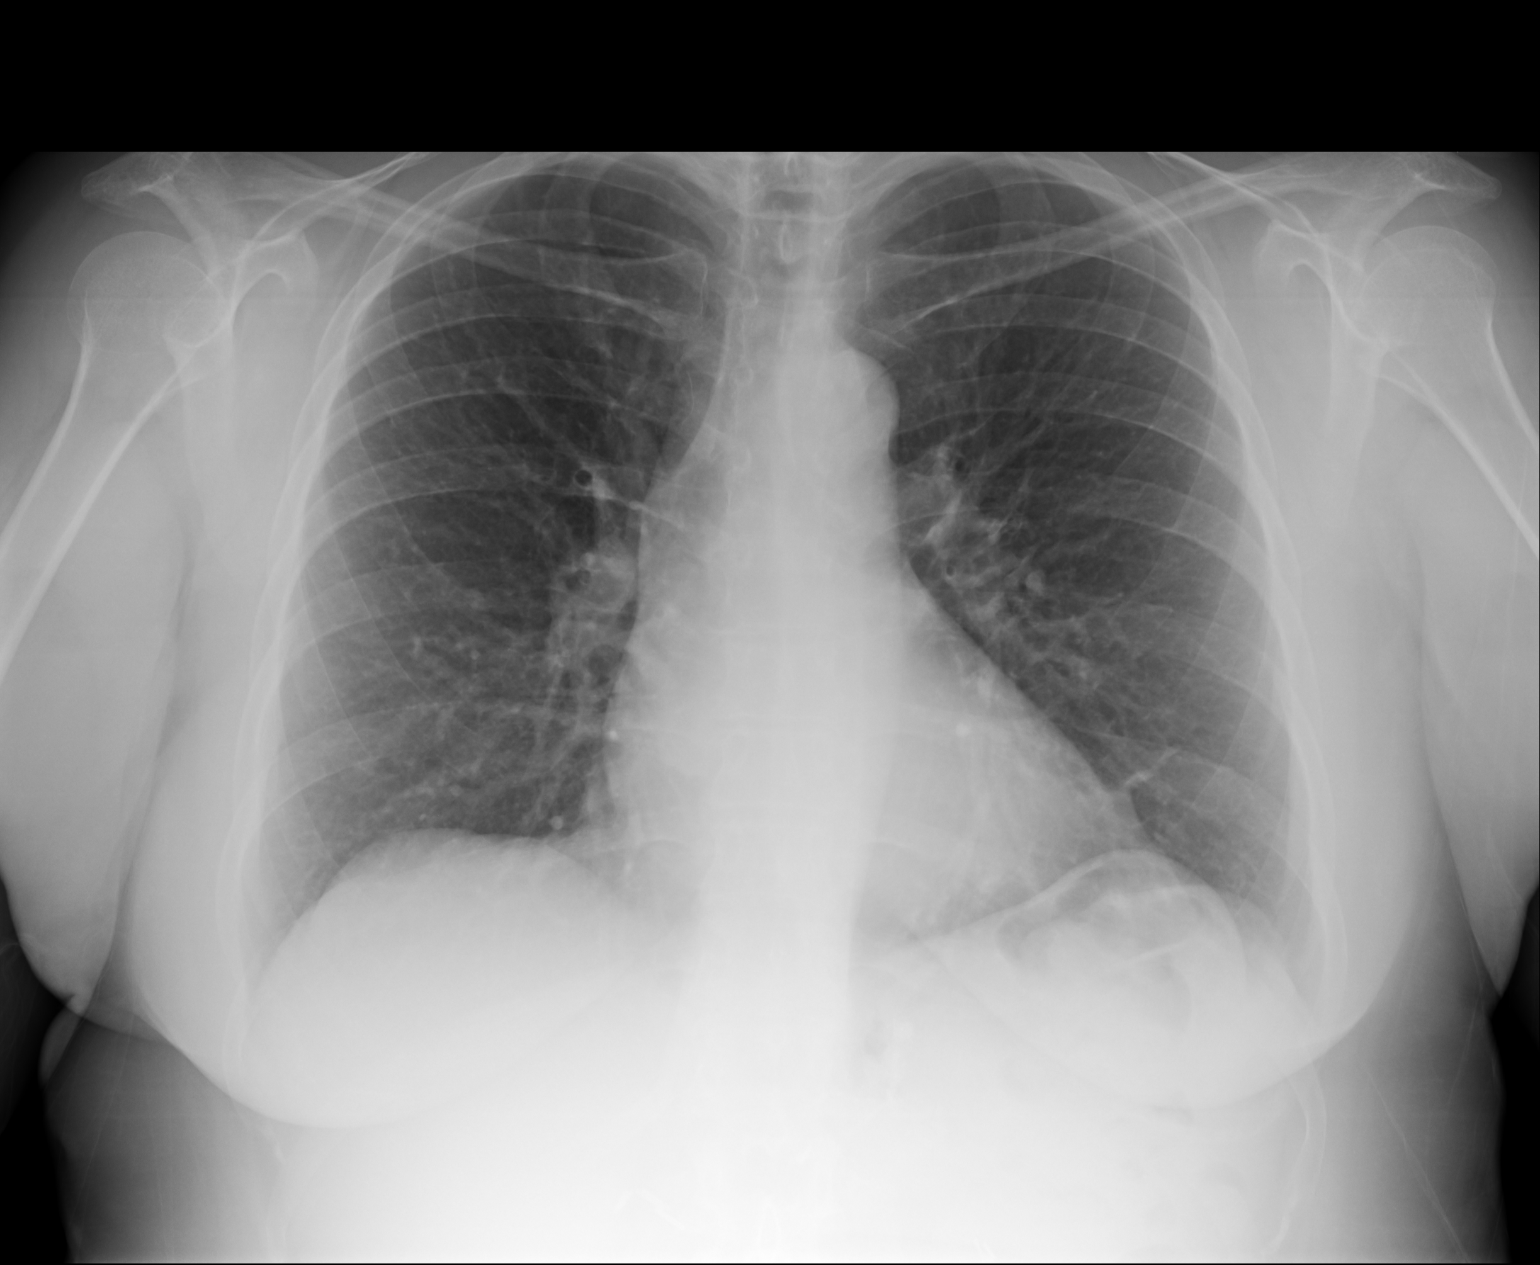
[im 2/2]
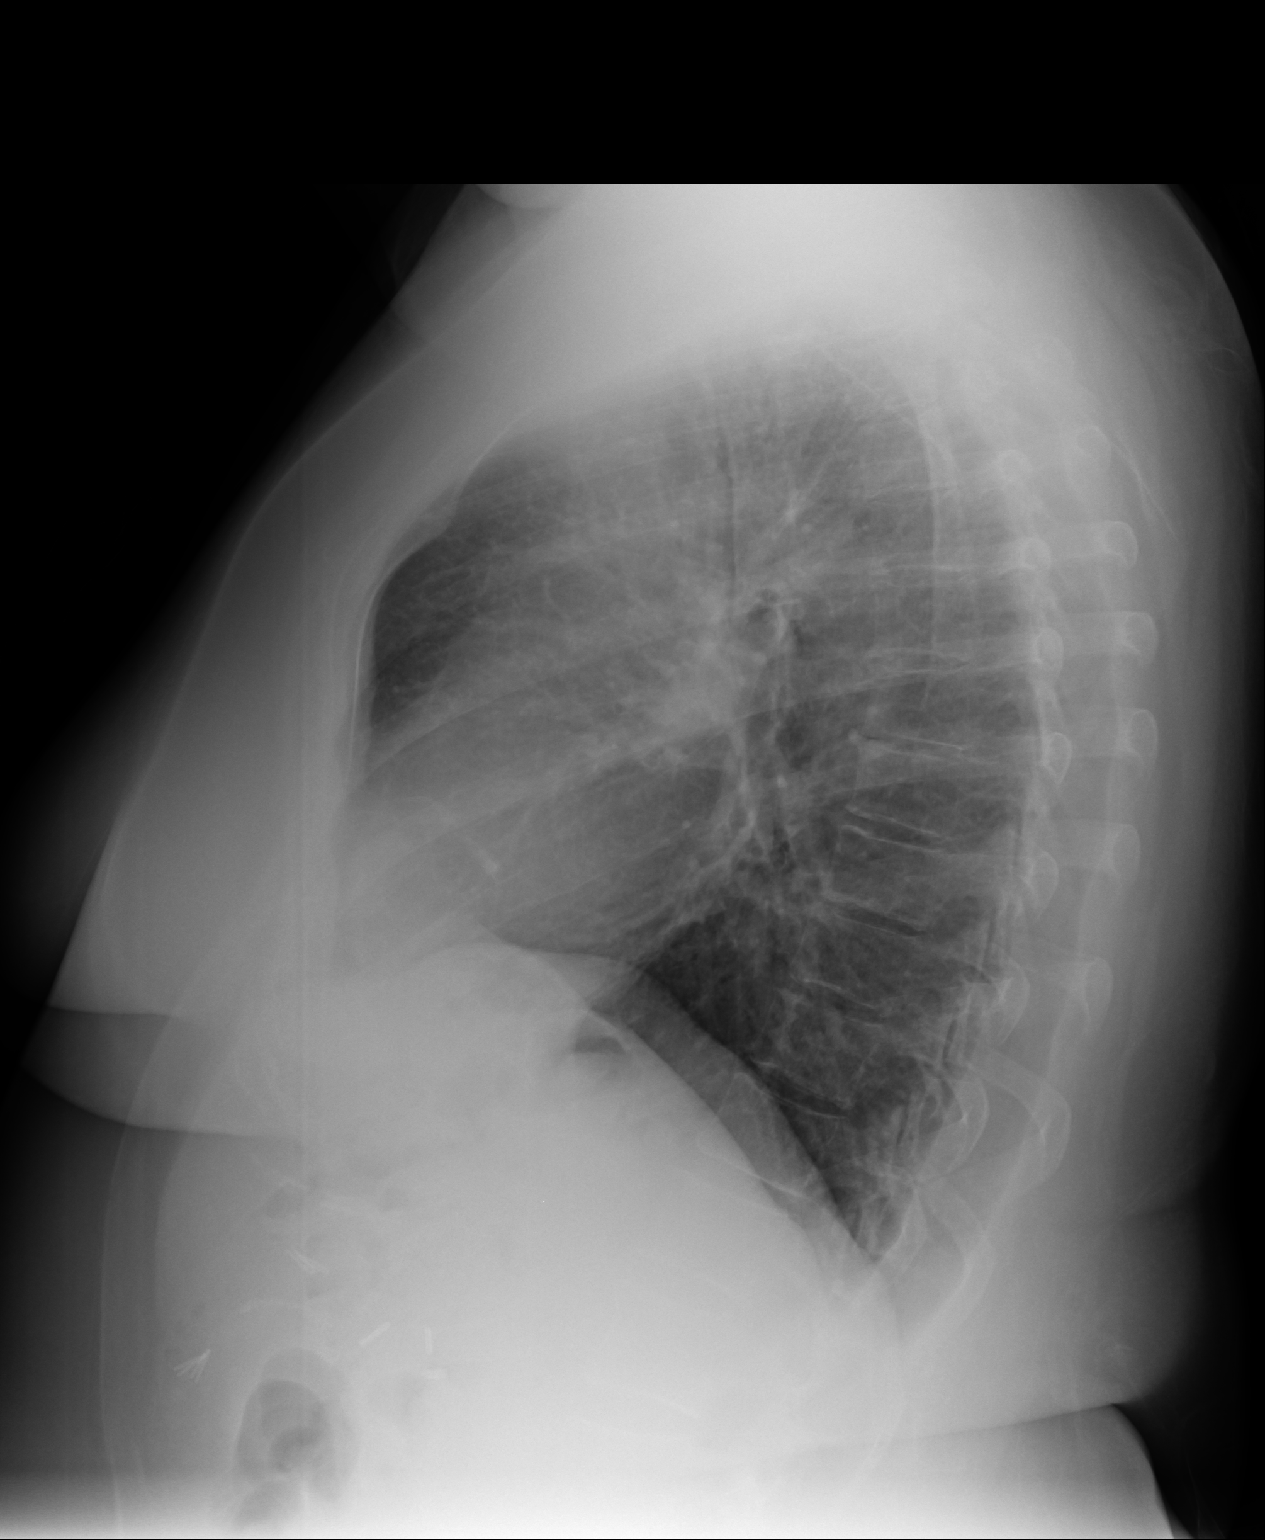

[2 of 2 positions shown; findings below may reference images not displayed]

FINDINGS: The heart and mediastinum are stable. No focal pulmonary opacities.
IMPRESSION: No acute cardiopulmonary disease.

[REDACTED]

## 2015-03-22 ENCOUNTER — Encounter: Payer: Self-pay | Admitting: Family Medicine

## 2015-03-23 ENCOUNTER — Encounter: Payer: Self-pay | Admitting: Family Medicine

## 2015-03-23 ENCOUNTER — Ambulatory Visit (INDEPENDENT_AMBULATORY_CARE_PROVIDER_SITE_OTHER): Payer: Federal, State, Local not specified - PPO | Admitting: Family Medicine

## 2015-03-23 VITALS — BP 118/68 | HR 102 | Temp 97.6°F | Resp 20 | Wt 203.4 lb

## 2015-03-23 DIAGNOSIS — J0101 Acute recurrent maxillary sinusitis: Secondary | ICD-10-CM | POA: Diagnosis not present

## 2015-03-23 MED ORDER — AMOXICILLIN-POT CLAVULANATE 875-125 MG PO TABS: 1.0000 | ORAL_TABLET | Freq: Two times a day (BID) | ORAL | Status: DC

## 2015-03-23 NOTE — Progress Notes (Signed)
Name: Terri Castillo   MRN: [id number]    DOB: [birth year]   Date:03/23/2015       Progress Note  Subjective  Chief Complaint  Chief Complaint  Patient presents with  . Allergies    HPI  Acute Maxillary Sinusitis: she had a flare of AR over the past week, using all her medications, but developed swelling and pain on left maxillary sinus, that improved some with flonase, but over the past two days she has been feeling worse of congestion and facial pressure. She states she has symptoms every February. She denies post-nasal drainage. Mild sore throat  Patient Active Problem List   Diagnosis Date Noted  . Iron deficiency anemia 09/04/2014  . Hyperglycemia 09/04/2014  . Hypocalcemia 09/04/2014  . Perioral dermatitis 09/04/2014  . 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 B12 deficiency 07/02/2014  . Cervical pain 07/02/2014  . Arthralgia of lower leg 07/02/2014  . Insomnia 07/02/2014  . 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 Dyslipidemia 07/02/2014  . Dermatitis, eczematoid 07/02/2014  . Genital herpes in women 07/02/2014  . Gastric reflux 07/02/2014  . Bariatric surgery status 07/02/2014  . Herpes simplex 07/02/2014  . H/O: pneumonia 07/02/2014  . Hypoglycemia 07/02/2014  . Eczema intertrigo 07/02/2014  . Adult BMI 30+ 07/02/2014  . OP (osteoporosis) 07/02/2014  . 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 Perennial allergic rhinitis with seasonal variation 07/02/2014  . Basal cell papilloma 07/02/2014  . Vitamin D deficiency 07/02/2014    Past Surgical History  Procedure Laterality Date  . Cholecystectomy  2000  . Eye surgery  2000    Cataract Removal  . Eye surgery  2002    Cataract Removal  . Abdominal hysterectomy  1994  . Gastric bypass  2000    Mini  . Gastric bypass  2010    Family History  Problem Relation Age of Onset  . Dementia Mother   . COPD Mother   . Osteoporosis Mother   . Diabetes Father   . Glaucoma Father   . Parkinson's disease Father   .  Glaucoma Brother   . Heart disease Brother   . Heart Problems Brother   . Brain cancer Daughter     Brain Tumor  . Hypothyroidism Daughter     Social History   Social History  . Marital Status: Divorced    Spouse Name: N/A  . Number of Children: N/A  . Years of Education: N/A   Occupational History  . Not on file.   Social History Main Topics  . Smoking status: Former Smoker -- 0.50 packs/day for 5 years    Types: Cigarettes    Start date: 01/23/1985    Quit date: 01/23/1990  . Smokeless tobacco: Never Used  . Alcohol Use: No  . Drug Use: No  . Sexual Activity: Not Currently   Other Topics Concern  . Not on file   Social History Narrative     Current outpatient prescriptions:  .  acetaminophen (TYLENOL) 500 MG tablet, Take 1 tablet by mouth 4 (four) times daily as needed., Disp: , Rfl:  .  ALPRAZolam (XANAX) 0.5 MG tablet, Take 1 tablet (0.5 mg total) by mouth 2 (two) times daily as needed for anxiety., Disp: 60 tablet, Rfl: 1 .  amoxicillin-clavulanate (AUGMENTIN) 875-125 MG tablet, Take 1 tablet by mouth 2 (two) times daily., Disp: 20 tablet, Rfl: 0 .  atorvastatin (LIPITOR) 20 MG tablet, , Disp: , Rfl:  .  calcium-vitamin D (OSCAL WITH D) 500-200 MG-UNIT per tablet,  Take 1 tablet by mouth 2 (two) times daily., Disp: 60 tablet, Rfl: 0 .  cetirizine (ZYRTEC) 10 MG tablet, Take 10 mg by mouth daily., Disp: , Rfl:  .  DULoxetine (CYMBALTA) 60 MG capsule, Take 1 capsule (60 mg total) by mouth daily., Disp: 90 capsule, Rfl: 0 .  fluticasone (FLONASE) 50 MCG/ACT nasal spray, Place 2 sprays into both nostrils at bedtime., Disp: 48 g, Rfl: 1 .  pantoprazole (PROTONIX) 40 MG tablet, Take 1 tablet (40 mg total) by mouth daily., Disp: 90 tablet, Rfl: 1 .  Pediatric Multivitamins-Iron (MULTIPLE VITAMINS-IRON PO), Take 1 tablet by mouth daily., Disp: , Rfl:  .  PREMARIN vaginal cream, Apply 1 application topically 3 (three) times a week., Disp: , Rfl: 12 .  raloxifene (EVISTA) 60  MG tablet, Take 1 tablet (60 mg total) by mouth daily., Disp: 90 tablet, Rfl: 4 .  triamcinolone cream (KENALOG) 0.5 %, Apply 1 application topically daily as needed., Disp: , Rfl:  .  valACYclovir (VALTREX) 500 MG tablet, Take 1 tablet by mouth 3 (three) times a week., Disp: , Rfl: 0  Allergies  Allergen Reactions  . Trazodone     Other reaction(s): Dizziness  . Topamax  [Topiramate]     severe depression     ROS  Ten systems reviewed and is negative except as mentioned in HPI   Objective  Filed Vitals:   03/23/15 1305  BP: 118/68  Pulse: 102  Temp: 97.6 F (36.4 C)  Resp: 20  Weight: 203 lb 6 oz (92.25 kg)  SpO2: 98%    Body mass index is 36.04 kg/(m^2).  Physical Exam  Constitutional: Patient appears well-developed and well-nourished. Obese No distress.  HEENT: head atraumatic, normocephalic, pupils equal and reactive to light, ears normal TM on right wax on left ear canal and unable to visualize   neck supple, throat within normal limits, mild tenderness maxillary sinus percussion  Cardiovascular: Normal rate, regular rhythm and normal heart sounds.  No murmur heard. No BLE edema. Pulmonary/Chest: Effort normal and breath sounds normal. No respiratory distress. Abdominal: Soft.  There is no tenderness. Psychiatric: Patient has a normal mood and affect. behavior is normal. Judgment and thought content normal.  PHQ2/9: Depression screen [facility] 2/9 03/23/2015 03/05/2015 12/07/2014 09/04/2014 07/02/2014  Decreased Interest 0 0 0 0 1  Down, Depressed, Hopeless 0 0 0 0 0  PHQ - 2 Score 0 0 0 0 1     Fall Risk: Fall Risk  03/23/2015 03/05/2015 12/07/2014 09/04/2014 07/02/2014  Falls in the past year? Yes Yes Yes Yes No  Number falls in past yr: -  Injury with Fall? - No No No -     Functional Status Survey: Is the patient deaf or have difficulty hearing?: No Does the patient have difficulty seeing, even when wearing glasses/contacts?: No Does the patient have  difficulty concentrating, remembering, or making decisions?: No Does the patient have difficulty walking or climbing stairs?: No Does the patient have difficulty dressing or bathing?: No Does the patient have difficulty doing errands alone such as visiting a doctor's office or shopping?: No    Assessment & Plan  1. Acute recurrent maxillary sinusitis  Advised saline spray, increase flonase to twice daily and if no improvement start antibiotics.  - amoxicillin-clavulanate (AUGMENTIN) 875-125 MG tablet; Take 1 tablet by mouth 2 (two) times daily.  Dispense: 20 tablet; Refill: 0

## 2015-04-13 ENCOUNTER — Other Ambulatory Visit: Payer: Self-pay | Admitting: Family Medicine

## 2015-04-13 NOTE — Telephone Encounter (Signed)
Patient requesting refill. 

## 2015-05-06 ENCOUNTER — Other Ambulatory Visit: Payer: Self-pay | Admitting: Family Medicine

## 2015-05-06 NOTE — Telephone Encounter (Signed)
Patient requesting refill. 

## 2015-05-26 ENCOUNTER — Ambulatory Visit
Admission: RE | Admit: 2015-05-26 | Discharge: 2015-05-26 | Disposition: A | Discharge status: Home / Self Care | Payer: Federal, State, Local not specified - PPO | Source: Ambulatory Visit | Attending: Family Medicine | Admitting: Family Medicine

## 2015-05-26 DIAGNOSIS — Z13820 Encounter for screening for osteoporosis: Secondary | ICD-10-CM | POA: Diagnosis not present

## 2015-05-26 DIAGNOSIS — M8580 Other specified disorders of bone density and structure, unspecified site: Secondary | ICD-10-CM | POA: Insufficient documentation

## 2015-05-26 DIAGNOSIS — M810 Age-related osteoporosis without current pathological fracture: Secondary | ICD-10-CM

## 2015-06-08 ENCOUNTER — Other Ambulatory Visit: Payer: Self-pay | Admitting: Family Medicine

## 2015-06-29 ENCOUNTER — Encounter: Payer: Self-pay | Admitting: Family Medicine

## 2015-07-02 ENCOUNTER — Other Ambulatory Visit: Payer: Self-pay | Admitting: Family Medicine

## 2015-07-02 ENCOUNTER — Encounter: Payer: Self-pay | Admitting: Family Medicine

## 2015-07-04 ENCOUNTER — Other Ambulatory Visit: Payer: Self-pay | Admitting: Family Medicine

## 2015-07-04 DIAGNOSIS — Z79899 Other long term (current) drug therapy: Secondary | ICD-10-CM

## 2015-07-04 DIAGNOSIS — Z9884 Bariatric surgery status: Secondary | ICD-10-CM

## 2015-07-04 DIAGNOSIS — D509 Iron deficiency anemia, unspecified: Secondary | ICD-10-CM

## 2015-07-04 DIAGNOSIS — E785 Hyperlipidemia, unspecified: Secondary | ICD-10-CM

## 2015-07-04 DIAGNOSIS — E538 Deficiency of other specified B group vitamins: Secondary | ICD-10-CM

## 2015-07-04 DIAGNOSIS — E559 Vitamin D deficiency, unspecified: Secondary | ICD-10-CM

## 2015-07-04 DIAGNOSIS — R739 Hyperglycemia, unspecified: Secondary | ICD-10-CM

## 2015-07-05 ENCOUNTER — Other Ambulatory Visit: Payer: Self-pay | Admitting: Family Medicine

## 2015-07-05 NOTE — Progress Notes (Signed)
Patient has already picked up lab slip.

## 2015-07-06 ENCOUNTER — Encounter: Payer: Self-pay | Admitting: Family Medicine

## 2015-07-06 ENCOUNTER — Ambulatory Visit (INDEPENDENT_AMBULATORY_CARE_PROVIDER_SITE_OTHER): Payer: Federal, State, Local not specified - PPO | Admitting: Family Medicine

## 2015-07-06 VITALS — BP 122/74 | HR 111 | Temp 98.1°F | Resp 18 | Ht 63.0 in | Wt 207.4 lb

## 2015-07-06 DIAGNOSIS — E785 Hyperlipidemia, unspecified: Secondary | ICD-10-CM

## 2015-07-06 DIAGNOSIS — E668 Other obesity: Secondary | ICD-10-CM | POA: Diagnosis not present

## 2015-07-06 DIAGNOSIS — J309 Allergic rhinitis, unspecified: Secondary | ICD-10-CM | POA: Diagnosis not present

## 2015-07-06 DIAGNOSIS — B001 Herpesviral vesicular dermatitis: Secondary | ICD-10-CM

## 2015-07-06 DIAGNOSIS — E875 Hyperkalemia: Secondary | ICD-10-CM | POA: Diagnosis not present

## 2015-07-06 DIAGNOSIS — IMO0002 Reserved for concepts with insufficient information to code with codable children: Secondary | ICD-10-CM

## 2015-07-06 DIAGNOSIS — A609 Anogenital herpesviral infection, unspecified: Secondary | ICD-10-CM

## 2015-07-06 DIAGNOSIS — R739 Hyperglycemia, unspecified: Secondary | ICD-10-CM | POA: Diagnosis not present

## 2015-07-06 DIAGNOSIS — Z862 Personal history of diseases of the blood and blood-forming organs and certain disorders involving the immune mechanism: Secondary | ICD-10-CM

## 2015-07-06 DIAGNOSIS — E538 Deficiency of other specified B group vitamins: Secondary | ICD-10-CM | POA: Diagnosis not present

## 2015-07-06 DIAGNOSIS — A6009 Herpesviral infection of other urogenital tract: Secondary | ICD-10-CM

## 2015-07-06 DIAGNOSIS — E559 Vitamin D deficiency, unspecified: Secondary | ICD-10-CM | POA: Diagnosis not present

## 2015-07-06 DIAGNOSIS — K921 Melena: Secondary | ICD-10-CM | POA: Diagnosis not present

## 2015-07-06 DIAGNOSIS — F330 Major depressive disorder, recurrent, mild: Secondary | ICD-10-CM | POA: Diagnosis not present

## 2015-07-06 DIAGNOSIS — J3089 Other allergic rhinitis: Secondary | ICD-10-CM

## 2015-07-06 LAB — HEMOGLOBIN A1C
ESTIMATED AVERAGE GLUCOSE: 103 mg/dL
HEMOGLOBIN A1C: 5.2 % (ref 4.8–5.6)

## 2015-07-06 LAB — CBC WITH DIFFERENTIAL/PLATELET
BASOS ABS: 0 10*3/uL (ref 0.0–0.2)
Basos: 1 %
EOS (ABSOLUTE): 0.2 10*3/uL (ref 0.0–0.4)
Eos: 5 %
Hematocrit: 45.3 % (ref 34.0–46.6)
Hemoglobin: 14.9 g/dL (ref 11.1–15.9)
IMMATURE GRANS (ABS): 0 10*3/uL (ref 0.0–0.1)
Immature Granulocytes: 0 %
LYMPHS: 26 %
Lymphocytes Absolute: 1.4 10*3/uL (ref 0.7–3.1)
MCH: 30.5 pg (ref 26.6–33.0)
MCHC: 32.9 g/dL (ref 31.5–35.7)
MCV: 93 fL (ref 79–97)
Monocytes Absolute: 0.4 10*3/uL (ref 0.1–0.9)
Monocytes: 7 %
NEUTROS ABS: 3.2 10*3/uL (ref 1.4–7.0)
NEUTROS PCT: 61 %
PLATELETS: 309 10*3/uL (ref 150–379)
RBC: 4.89 x10E6/uL (ref 3.77–5.28)
RDW: 14 % (ref 12.3–15.4)
WBC: 5.2 10*3/uL (ref 3.4–10.8)

## 2015-07-06 LAB — COMPREHENSIVE METABOLIC PANEL
ALK PHOS: 108 IU/L (ref 39–117)
ALT: 19 IU/L (ref 0–32)
AST: 19 IU/L (ref 0–40)
Albumin/Globulin Ratio: 1.5 (ref 1.2–2.2)
Albumin: 3.7 g/dL (ref 3.6–4.8)
BILIRUBIN TOTAL: 1.1 mg/dL (ref 0.0–1.2)
BUN/Creatinine Ratio: 14 (ref 12–28)
BUN: 10 mg/dL (ref 8–27)
CHLORIDE: 104 mmol/L (ref 96–106)
CO2: 24 mmol/L (ref 18–29)
Calcium: 8.8 mg/dL (ref 8.7–10.3)
Creatinine, Ser: 0.73 mg/dL (ref 0.57–1.00)
GFR calc non Af Amer: 88 mL/min/{1.73_m2} (ref >59)
GFR, EST AFRICAN AMERICAN: 101 mL/min/{1.73_m2} (ref >59)
GLUCOSE: 94 mg/dL (ref 65–99)
Globulin, Total: 2.4 g/dL (ref 1.5–4.5)
Potassium: 5.4 mmol/L — ABNORMAL HIGH (ref 3.5–5.2)
Sodium: 143 mmol/L (ref 134–144)
TOTAL PROTEIN: 6.1 g/dL (ref 6.0–8.5)

## 2015-07-06 LAB — LIPID PANEL
CHOLESTEROL TOTAL: 181 mg/dL (ref 100–199)
Chol/HDL Ratio: 4 ratio units (ref 0.0–4.4)
HDL: 45 mg/dL (ref >39)
LDL Calculated: 100 mg/dL — ABNORMAL HIGH (ref 0–99)
TRIGLYCERIDES: 179 mg/dL — AB (ref 0–149)
VLDL CHOLESTEROL CAL: 36 mg/dL (ref 5–40)

## 2015-07-06 LAB — IRON AND TIBC
Iron Saturation: 53 % (ref 15–55)
Iron: 183 ug/dL — ABNORMAL HIGH (ref 27–139)
Total Iron Binding Capacity: 345 ug/dL (ref 250–450)
UIBC: 162 ug/dL (ref 118–369)

## 2015-07-06 LAB — VITAMIN D 25 HYDROXY (VIT D DEFICIENCY, FRACTURES): VIT D 25 HYDROXY: 25.3 ng/mL — AB (ref 30.0–100.0)

## 2015-07-06 LAB — VITAMIN B12: VITAMIN B 12: 436 pg/mL (ref 211–946)

## 2015-07-06 LAB — FERRITIN: FERRITIN: 27 ng/mL (ref 15–150)

## 2015-07-06 MED ORDER — FLUTICASONE PROPIONATE 50 MCG/ACT NA SUSP: 2.0000 | Freq: Every day | NASAL | Status: DC

## 2015-07-06 MED ORDER — VALACYCLOVIR HCL 500 MG PO TABS: 500.0000 mg | ORAL_TABLET | Freq: Three times a day (TID) | ORAL | Status: DC

## 2015-07-06 MED ORDER — VITAMIN D (ERGOCALCIFEROL) 1.25 MG (50000 UNIT) PO CAPS: 50000.0000 [IU] | ORAL_CAPSULE | ORAL | Status: DC

## 2015-07-06 MED ORDER — CYANOCOBALAMIN 1000 MCG/ML IJ SOLN: 1000.0000 ug | Freq: Once | INTRAMUSCULAR | Status: DC

## 2015-07-06 MED ORDER — CYANOCOBALAMIN 1000 MCG/ML IJ SOLN
1000.0000 ug | Freq: Once | INTRAMUSCULAR | Status: AC
  Administered 2015-07-06: 1000 ug via INTRAMUSCULAR

## 2015-07-06 MED ORDER — ALPRAZOLAM 0.5 MG PO TABS: 0.5000 mg | ORAL_TABLET | Freq: Two times a day (BID) | ORAL | Status: DC | PRN

## 2015-07-06 NOTE — Progress Notes (Signed)
Name: Terri Castillo   MRN: [id number]    DOB: [birth year]   Date:07/06/2015       Progress Note  Subjective  Chief Complaint  Chief Complaint  Patient presents with  . Medication Refill  . Insomnia  . Dyslipidemia  . Depression    HPI  Depression Mild Recurrent: she is currently taking Cymbalta and Alprazolam prn. She thinks her mood is more melow, not as irritable, she still has anhedonia, but states she went out with her sister recently to eat and go shopping . Terri Castillo has failed Prozac, Wellbutrin and changed from Lexapro because daughter was worried about meningioma.Normal appetite, she states sleep has been good, takes Melatonin prn and also Alprazolam qhs. She is very worried about her dog that is actively dying. Having some crying spells.   Vitamin D : slightly low, history of osteoporosis so we will give rx vitamin D  Osteoporosis: she took Forteo for 2 years around  2009, currently on Evista, bone density showed osteopenia, continue Evista and recheck in 2 years  Iron deficiency anemia: colonoscopy is up to date, history of bariatric surgery, current CBC is normal.   Blood in stools: she has noticed that she has to strain to have a bowel movement and has noticed blood when she wipes. Her last colonoscopy was normal in 2014. She has a long history of constipation. Taking stool softeners, we will try Miralax.  Hyperlipidemia: last labs last year were at goal, but she has stopped taking statins. She has changed her diet and would like to recheck without medication on her next visit. No chest pain or decrease in exercise tolerance.  Based on the results of lipid panel his cardiovascular risk factor ( using [facility] )  in the next 10 years is :4.3% and we will keep her off statin therapy for now.   GERD: she is taking Pantoprazole prn only, doing well  History of bariatric surgery: she had gastric bypass in 2010, her lowest weight was 180 lbs, but has gradually gained  weight since. She has eliminated sugar and is feeling better, she has gained a 4 lbs since last visit, and she gets frustrated  B12: stable level, feeling very tired, history of bariatric surgery so we will replace it today  Fever Blister: she takes medication prn and has a few episodes per year, also has genital herpes but only one episode over the past year  Hyperlipidemia: denies polydipsia, polyuria or polyphagia.   Patient Active Problem List   Diagnosis Date Noted  . Iron deficiency anemia 09/04/2014  . Hyperglycemia 09/04/2014  . Perioral dermatitis 09/04/2014  . 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 B12 deficiency 07/02/2014  . Cervical pain 07/02/2014  . Arthralgia of lower leg 07/02/2014  . Insomnia 07/02/2014  . 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 Dyslipidemia 07/02/2014  . Dermatitis, eczematoid 07/02/2014  . Genital herpes in women 07/02/2014  . Gastric reflux 07/02/2014  . Bariatric surgery status 07/02/2014  . Herpes simplex 07/02/2014  . H/O: pneumonia 07/02/2014  . Hypoglycemia 07/02/2014  . Eczema intertrigo 07/02/2014  . Adult BMI 30+ 07/02/2014  . OP (osteoporosis) 07/02/2014  . 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 Perennial allergic rhinitis with seasonal variation 07/02/2014  . Basal cell papilloma 07/02/2014  . Vitamin D deficiency 07/02/2014    Past Surgical History  Procedure Laterality Date  . Cholecystectomy  2000  . Eye surgery  2000    Cataract Removal  . Eye surgery  2002  Cataract Removal  . Abdominal hysterectomy  1994  . Gastric bypass  2000    Mini  . Gastric bypass  2010    Family History  Problem Relation Age of Onset  . Dementia Mother   . COPD Mother   . Osteoporosis Mother   . Diabetes Father   . Glaucoma Father   . Parkinson's disease Father   . Glaucoma Brother   . Heart disease Brother   . Heart Problems Brother   . Brain cancer Daughter     Brain Tumor  . Hypothyroidism Daughter     Social History    Social History  . Marital Status: Divorced    Spouse Name: N/A  . Number of Children: N/A  . Years of Education: N/A   Occupational History  . Not on file.   Social History Main Topics  . Smoking status: Former Smoker -- 0.50 packs/day for 5 years    Types: Cigarettes    Start date: 01/23/1985    Quit date: 01/23/1990  . Smokeless tobacco: Never Used  . Alcohol Use: No  . Drug Use: No  . Sexual Activity: Not Currently   Other Topics Concern  . Not on file   Social History Narrative     Current outpatient prescriptions:  .  acetaminophen (TYLENOL) 500 MG tablet, Take 1 tablet by mouth 4 (four) times daily as needed., Disp: , Rfl:  .  ALPRAZolam (XANAX) 0.5 MG tablet, Take 1 tablet (0.5 mg total) by mouth 2 (two) times daily as needed for anxiety., Disp: 60 tablet, Rfl: 1 .  calcium-vitamin D (OSCAL WITH D) 500-200 MG-UNIT per tablet, Take 1 tablet by mouth 2 (two) times daily., Disp: 60 tablet, Rfl: 0 .  cetirizine (ZYRTEC) 10 MG tablet, Take 10 mg by mouth daily., Disp: , Rfl:  .  DULoxetine (CYMBALTA) 60 MG capsule, TAKE ONE CAPSULE BY MOUTH EVERY DAY, Disp: 90 capsule, Rfl: 0 .  fluticasone (FLONASE) 50 MCG/ACT nasal spray, Place 2 sprays into both nostrils at bedtime., Disp: 48 g, Rfl: 1 .  ketoconazole (NIZORAL) 2 % cream, APPLY TO RASH TWICE DAILY, Disp: 90 g, Rfl: 0 .  pantoprazole (PROTONIX) 40 MG tablet, Take 1 tablet (40 mg total) by mouth daily., Disp: 90 tablet, Rfl: 1 .  Pediatric Multivitamins-Iron (MULTIPLE VITAMINS-IRON PO), Take 1 tablet by mouth daily., Disp: , Rfl:  .  PREMARIN vaginal cream, Apply 1 application topically 3 (three) times a week., Disp: , Rfl: 12 .  raloxifene (EVISTA) 60 MG tablet, Take 1 tablet (60 mg total) by mouth daily., Disp: 90 tablet, Rfl: 4 .  triamcinolone cream (KENALOG) 0.5 %, Apply 1 application topically daily as needed., Disp: , Rfl:  .  valACYclovir (VALTREX) 500 MG tablet, Take 1 tablet (500 mg total) by mouth 3 (three)  times daily., Disp: 30 tablet, Rfl: 0 .  Vitamin D, Ergocalciferol, (DRISDOL) 50000 units CAPS capsule, Take 1 capsule (50,000 Units total) by mouth every 7 (seven) days., Disp: 12 capsule, Rfl: 1  Current facility-administered medications:  .  cyanocobalamin ((VITAMIN B-12)) injection 1,000 mcg, 1,000 mcg, Intramuscular, Once, Alba Cory, MD  Allergies  Allergen Reactions  . Trazodone     Other reaction(s): Dizziness  . Topamax  [Topiramate]     severe depression     ROS  Constitutional: Negative for fever , positive for  weight change.  Respiratory: Negative for cough and shortness of breath.   Cardiovascular: Negative for chest pain or palpitations.  Gastrointestinal: Negative  for abdominal pain, no bowel changes.  Musculoskeletal: Negative for gait problem or joint swelling.  Skin: Negative for rash.  Neurological: Negative for dizziness or headache.  No other specific complaints in a complete review of systems (except as listed in HPI above).  Objective  Filed Vitals:   07/06/15 0811  BP: 122/74  Pulse: 111  Temp: 98.1 F (36.7 C)  TempSrc: Oral  Resp: 18  Height: 5\' 3"  (1.6 m)  Weight: 207 lb 6.4 oz (94.076 kg)  SpO2: 98%    Body mass index is 36.75 kg/(m^2).  Physical Exam  Constitutional: Patient appears well-developed and well-nourished. Obese  No distress.  HEENT: head atraumatic, normocephalic, pupils equal and reactive to ligh, neck supple, throat within normal limits Cardiovascular: Normal rate, regular rhythm and normal heart sounds.  No murmur heard. No BLE edema. Pulmonary/Chest: Effort normal and breath sounds normal. No respiratory distress. Abdominal: Soft.  There is no tenderness. Psychiatric: Patient has a normal mood and affect. behavior is normal. Judgment and thought content normal.  Recent Results (from the past 2160 hour(s))  CBC with Differential/Platelet     Status: None   Collection Time: 07/05/15  9:59 AM  Result Value Ref Range    WBC 5.2 3.4 - 10.8 x10E3/uL   RBC 4.89 3.77 - 5.28 x10E6/uL   Hemoglobin 14.9 11.1 - 15.9 g/dL   Hematocrit 16.1 09.6 - 46.6 %   MCV 93 79 - 97 fL   MCH 30.5 26.6 - 33.0 pg   MCHC 32.9 31.5 - 35.7 g/dL   RDW 04.5 40.9 - 81.1 %   Platelets 309 150 - 379 x10E3/uL   Neutrophils 61 %   Lymphs 26 %   Monocytes 7 %   Eos 5 %   Basos 1 %   Neutrophils Absolute 3.2 1.4 - 7.0 x10E3/uL   Lymphocytes Absolute 1.4 0.7 - 3.1 x10E3/uL   Monocytes Absolute 0.4 0.1 - 0.9 x10E3/uL   EOS (ABSOLUTE) 0.2 0.0 - 0.4 x10E3/uL   Basophils Absolute 0.0 0.0 - 0.2 x10E3/uL   Immature Granulocytes 0 %   Immature Grans (Abs) 0.0 0.0 - 0.1 x10E3/uL  Comprehensive metabolic panel     Status: Abnormal   Collection Time: 07/05/15  9:59 AM  Result Value Ref Range   Glucose 94 65 - 99 mg/dL   BUN 10 8 - 27 mg/dL   Creatinine, Ser 9.14 0.57 - 1.00 mg/dL   GFR calc non Af Amer 88 >59 mL/min/1.73   GFR calc Af Amer 101 >59 mL/min/1.73   BUN/Creatinine Ratio 14 12 - 28   Sodium 143 134 - 144 mmol/L   Potassium 5.4 (H) 3.5 - 5.2 mmol/L   Chloride 104 96 - 106 mmol/L   CO2 24 18 - 29 mmol/L   Calcium 8.8 8.7 - 10.3 mg/dL   Total Protein 6.1 6.0 - 8.5 g/dL   Albumin 3.7 3.6 - 4.8 g/dL   Globulin, Total 2.4 1.5 - 4.5 g/dL   Albumin/Globulin Ratio 1.5 1.2 - 2.2   Bilirubin Total 1.1 0.0 - 1.2 mg/dL   Alkaline Phosphatase 108 39 - 117 IU/L   AST 19 0 - 40 IU/L   ALT 19 0 - 32 IU/L  Lipid panel     Status: Abnormal   Collection Time: 07/05/15  9:59 AM  Result Value Ref Range   Cholesterol, Total 181 100 - 199 mg/dL   Triglycerides 782 (H) 0 - 149 mg/dL   HDL 45 >95 mg/dL   VLDL  Cholesterol Cal 36 5 - 40 mg/dL   LDL Calculated 161100 (H) 0 - 99 mg/dL   Chol/HDL Ratio 4.0 0.0 - 4.4 ratio units    Comment:                                   T. Chol/HDL Ratio                                             Men  Women                               1/2 Avg.Risk  3.4    3.3                                   Avg.Risk   5.0    4.4                                2X Avg.Risk  9.6    7.1                                3X Avg.Risk 23.4   11.0   Iron and TIBC     Status: None (Preliminary result)   Collection Time: 07/05/15  9:59 AM  Result Value Ref Range   Total Iron Binding Capacity WILL FOLLOW    UIBC WILL FOLLOW    Iron WILL FOLLOW    Iron Saturation WILL FOLLOW   Hemoglobin A1c     Status: None   Collection Time: 07/05/15  9:59 AM  Result Value Ref Range   Hgb A1c MFr Bld 5.2 4.8 - 5.6 %    Comment:          Pre-diabetes: 5.7 - 6.4          Diabetes: >6.4          Glycemic control for adults with diabetes: <7.0    Est. average glucose Bld gHb Est-mCnc 103 mg/dL  VITAMIN D 25 Hydroxy (Vit-D Deficiency, Fractures)     Status: Abnormal   Collection Time: 07/05/15  9:59 AM  Result Value Ref Range   Vit D, 25-Hydroxy 25.3 (L) 30.0 - 100.0 ng/mL    Comment: Vitamin D deficiency has been defined by the Institute of Medicine and an Endocrine Society practice guideline as a level of serum 25-OH vitamin D less than 20 ng/mL (1,2). The Endocrine Society went on to further define vitamin D insufficiency as a level between 21 and 29 ng/mL (2). 1. IOM (Institute of Medicine). 2010. Dietary reference    intakes for calcium and D. [place]: The    [employer]. 2. Holick MF, Binkley , Bischoff-Ferrari HA, et al.    Evaluation, treatment, and prevention of vitamin D    deficiency: an Endocrine Society clinical practice    guideline. JCEM. 2011 Jul; 96(7):1911-30.   Vitamin B12     Status: None   Collection Time: 07/05/15  9:59 AM  Result Value Ref Range   Vitamin B-12 436 211 - 946 pg/mL  Ferritin  Status: None (Preliminary result)   Collection Time: 07/05/15  9:59 AM  Result Value Ref Range   Ferritin WILL FOLLOW      PHQ2/9: Depression screen [facility] 2/9 07/06/2015 03/23/2015 03/05/2015 12/07/2014 09/04/2014  Decreased Interest 0 0 0 0 0  Down, Depressed, Hopeless 0 0 0 0 0  PHQ -  2 Score 0 0 0 0 0     Fall Risk: Fall Risk  07/06/2015 03/23/2015 03/05/2015 12/07/2014 09/04/2014  Falls in the past year? No Yes Yes Yes Yes  Number falls in past yr: - 1 1 1 1   Injury with Fall? - - No No No     Functional Status Survey: Is the patient deaf or have difficulty hearing?: No Does the patient have difficulty seeing, even when wearing glasses/contacts?: No Does the patient have difficulty concentrating, remembering, or making decisions?: No Does the patient have difficulty walking or climbing stairs?: No Does the patient have difficulty dressing or bathing?: No Does the patient have difficulty doing errands alone such as visiting a doctor's office or shopping?: No    Assessment & Plan  1. Dyslipidemia  Continue life style modification   2. B12 deficiency  - cyanocobalamin ((VITAMIN B-12)) injection 1,000 mcg; Inject 1 mL (1,000 mcg total) into the muscle once.  3. History of iron deficiency anemia  resolved  4. Hyperglycemia  hgbA1C is at goal   5. Vitamin D deficiency  - Vitamin D, Ergocalciferol, (DRISDOL) 50000 units CAPS capsule; Take 1 capsule (50,000 Units total) by mouth every 7 (seven) days.  Dispense: 12 capsule; Refill: 1  6. Adult BMI 30+  Discussed with the patient the risk posed by an increased BMI. Discussed importance of portion control, calorie counting and at least 150 minutes of physical activity weekly. Avoid sweet beverages and drink more water. Eat at least 6 servings of fruit and vegetables daily   7. Genital herpes in women  - valACYclovir (VALTREX) 500 MG tablet; Take 1 tablet (500 mg total) by mouth 3 (three) times daily.  Dispense: 30 tablet; Refill: 0  8. Depression, major, recurrent, mild (HCC)  - ALPRAZolam (XANAX) 0.5 MG tablet; Take 1 tablet (0.5 mg total) by mouth 2 (two) times daily as needed for anxiety.  Dispense: 60 tablet; Refill: 1 Continue Cymbalta, discussed stimulants, but with elevated heart rate we will  continue to monitor for now. Went shopping with her sister this week and seems to be doing slightly better  9. Perennial allergic rhinitis  - fluticasone (FLONASE) 50 MCG/ACT nasal spray; Place 2 sprays into both nostrils at bedtime.  Dispense: 48 g; Refill: 1  10. Fever blister  - valACYclovir (VALTREX) 500 MG tablet; Take 1 tablet (500 mg total) by mouth 3 (three) times daily.  Dispense: 30 tablet; Refill: 0  11. Serum potassium elevated  - Potassium  12. Hematochezia  Likely from hemorrhoids, advised to wait until clears and check hemoccult - POC Hemoccult Bld/Stl (3-Cd Home Screen); Future

## 2015-07-07 LAB — POTASSIUM: POTASSIUM: 5 mmol/L (ref 3.5–5.2)

## 2015-07-11 ENCOUNTER — Encounter: Payer: Self-pay | Admitting: Family Medicine

## 2015-09-07 ENCOUNTER — Encounter: Payer: Self-pay | Admitting: Family Medicine

## 2015-10-28 ENCOUNTER — Other Ambulatory Visit: Payer: Self-pay | Admitting: Family Medicine

## 2015-10-28 DIAGNOSIS — Z1231 Encounter for screening mammogram for malignant neoplasm of breast: Secondary | ICD-10-CM

## 2015-11-01 ENCOUNTER — Ambulatory Visit (INDEPENDENT_AMBULATORY_CARE_PROVIDER_SITE_OTHER): Payer: Federal, State, Local not specified - PPO | Admitting: Family Medicine

## 2015-11-01 ENCOUNTER — Encounter: Payer: Self-pay | Admitting: Family Medicine

## 2015-11-01 VITALS — BP 108/72 | HR 88 | Temp 97.8°F | Resp 16 | Ht 63.0 in | Wt 217.1 lb

## 2015-11-01 DIAGNOSIS — F331 Major depressive disorder, recurrent, moderate: Secondary | ICD-10-CM

## 2015-11-01 DIAGNOSIS — E538 Deficiency of other specified B group vitamins: Secondary | ICD-10-CM | POA: Diagnosis not present

## 2015-11-01 DIAGNOSIS — E559 Vitamin D deficiency, unspecified: Secondary | ICD-10-CM

## 2015-11-01 DIAGNOSIS — M545 Low back pain: Secondary | ICD-10-CM | POA: Diagnosis not present

## 2015-11-01 DIAGNOSIS — R739 Hyperglycemia, unspecified: Secondary | ICD-10-CM | POA: Diagnosis not present

## 2015-11-01 DIAGNOSIS — E785 Hyperlipidemia, unspecified: Secondary | ICD-10-CM | POA: Diagnosis not present

## 2015-11-01 DIAGNOSIS — J3089 Other allergic rhinitis: Secondary | ICD-10-CM | POA: Diagnosis not present

## 2015-11-01 DIAGNOSIS — K921 Melena: Secondary | ICD-10-CM

## 2015-11-01 DIAGNOSIS — G8929 Other chronic pain: Secondary | ICD-10-CM | POA: Diagnosis not present

## 2015-11-01 DIAGNOSIS — M810 Age-related osteoporosis without current pathological fracture: Secondary | ICD-10-CM | POA: Diagnosis not present

## 2015-11-01 DIAGNOSIS — Z23 Encounter for immunization: Secondary | ICD-10-CM | POA: Diagnosis not present

## 2015-11-01 MED ORDER — ALPRAZOLAM 0.5 MG PO TABS: 0.5000 mg | ORAL_TABLET | Freq: Every evening | ORAL | 2 refills | Status: DC | PRN

## 2015-11-01 MED ORDER — RALOXIFENE HCL 60 MG PO TABS: 60.0000 mg | ORAL_TABLET | Freq: Every day | ORAL | 4 refills | Status: DC

## 2015-11-01 MED ORDER — DULOXETINE HCL 30 MG PO CPEP: ORAL_CAPSULE | ORAL | 0 refills | Status: DC

## 2015-11-01 NOTE — Addendum Note (Signed)
Addended by: Cynda FamiliaJOHNSON, Leeann Bady L on: 11/01/2015 02:42 PM   Modules accepted: Orders

## 2015-11-01 NOTE — Progress Notes (Signed)
Name: Terri Castillo   MRN: [id number]    DOB: 1950-11-19   Date:11/01/2015       Progress Note  Subjective  Chief Complaint  Chief Complaint  Patient presents with  . Medication Refill    4 month F/U and Refills on all Medications  . Depression    Patient states she is still very emotional-lost her puppies this summer. Feels like the Cymbalta she could cry at any time and doesn't feel like it is working well for her. Patient has been an emotional eater and gained 10 pounds since last visit.  . Allergic Rhinitis     Well controlled with medication  . Gastroesophageal Reflux    Symptom controlled with medication  . Osteoarthritis    Patient has no refills on Evista and has not had it since June.  Marland Kitchen. Hot Flashes    Patient states she has been severe Hot flashes. Her face has been pouring sweats for about 30 minutes     HPI  Depression Moderate Recurrent: she is currently taking Cymbalta and Alprazolam prn. She thinks her mood is more melow, not as irritable, she states anhedonia has been worse since she lost both of her dogs over the past few months ( one dog died in June and the other one in August ). Terri Hashimotoatricia has failed Prozac, Wellbutrin and changed from Lexapro because daughter was worried about meningioma.She states she has gained weight because she is eating to control her stress, she states sleep has been getting worse, unable to fall asleep She has been more emotional   Vitamin D : slightly low, history of osteoporosis so we will give rx vitamin D  Osteoporosis: she took Forteo for 2 years around  2009, currently on Evista, bone density showed osteopenia, continue Evista and recheck in 2 years. She ran out of medication in June  Blood in stools: she has noticed that she has to strain to have a bowel movement and has noticed blood when she wipes. Her last colonoscopy was normal in 2014. She has a long history o f constipation. Taking stool softeners, she had another episode  since last visit, advised colonoscopy but she wants to hold off for now, but will go if it happens again. She will take stools cards home today  Hyperlipidemia: last labs last year were at goal, and she was off medication.. She has changed her diet and would like to recheck without medication on her next visit. No chest pain or decrease in exercise tolerance.  Based on the results of lipid panel his cardiovascular risk factor ( using [facility] )  in the next 10 years is :4.3% and we will keep her off statin therapy for now.   GERD: off medication and is doing well   History of bariatric surgery: she had gastric bypass in 2010, her lowest weight was 180 lbs, but has gradually gained weight since. She has eliminated sugar and is feeling better, she has gained a 4 lbs since last visit, and she gets frustrated  B12: stable level, feeling very tired, history of bariatric surgery so we will replace it today  Hyperglycemia: denies polydipsia, polyuria or polyphagia.   Patient Active Problem List   Diagnosis Date Noted  . Iron deficiency anemia 09/04/2014  . Hyperglycemia 09/04/2014  . Perioral dermatitis 09/04/2014  . 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 B12 deficiency 07/02/2014  . Cervical pain 07/02/2014  . Arthralgia of lower leg 07/02/2014  . Insomnia 07/02/2014  . Depression, major,  recurrent, mild (HCC) 07/02/2014  . Dyslipidemia 07/02/2014  . Dermatitis, eczematoid 07/02/2014  . Genital herpes in women 07/02/2014  . Gastric reflux 07/02/2014  . Bariatric surgery status 07/02/2014  . Herpes simplex 07/02/2014  . H/O: pneumonia 07/02/2014  . Hypoglycemia 07/02/2014  . Eczema intertrigo 07/02/2014  . Adult BMI 30+ 07/02/2014  . OP (osteoporosis) 07/02/2014  . 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 Perennial allergic rhinitis with seasonal variation 07/02/2014  . Basal cell papilloma 07/02/2014  . Vitamin D deficiency 07/02/2014    Past Surgical History:  Procedure Laterality Date  .  ABDOMINAL HYSTERECTOMY  1994  . CHOLECYSTECTOMY  2000  . EYE SURGERY  2000   Cataract Removal  . EYE SURGERY  2002   Cataract Removal  . GASTRIC BYPASS  2000   Mini  . GASTRIC BYPASS  2010    Family History  Problem Relation Age of Onset  . Dementia Mother   . COPD Mother   . Osteoporosis Mother   . Diabetes Father   . Glaucoma Father   . Parkinson's disease Father   . Glaucoma Brother   . Heart disease Brother   . Heart Problems Brother   . Brain cancer Daughter     Brain Tumor  . Hypothyroidism Daughter     Social History   Social History  . Marital status: Divorced    Spouse name: N/A  . Number of children: N/A  . Years of education: N/A   Occupational History  . Not on file.   Social History Main Topics  . Smoking status: Former Smoker    Packs/day: 0.50    Years: 5.00    Types: Cigarettes    Start date: 01/23/1985    Quit date: 01/23/1990  . Smokeless tobacco: Never Used  . Alcohol use No  . Drug use: No  . Sexual activity: Not Currently   Other Topics Concern  . Not on file   Social History Narrative  . No narrative on file     Current Outpatient Prescriptions:  .  acetaminophen (TYLENOL) 500 MG tablet, Take 1 tablet by mouth 4 (four) times daily as needed., Disp: , Rfl:  .  ALPRAZolam (XANAX) 0.5 MG tablet, Take 1 tablet (0.5 mg total) by mouth 2 (two) times daily as needed for anxiety., Disp: 60 tablet, Rfl: 1 .  calcium-vitamin D (OSCAL WITH D) 500-200 MG-UNIT per tablet, Take 1 tablet by mouth 2 (two) times daily., Disp: 60 tablet, Rfl: 0 .  cetirizine (ZYRTEC) 10 MG tablet, Take 10 mg by mouth daily., Disp: , Rfl:  .  DULoxetine (CYMBALTA) 60 MG capsule, TAKE ONE CAPSULE BY MOUTH EVERY DAY, Disp: 90 capsule, Rfl: 0 .  fluticasone (FLONASE) 50 MCG/ACT nasal spray, Place 2 sprays into both nostrils at bedtime., Disp: 48 g, Rfl: 1 .  ketoconazole (NIZORAL) 2 % cream, APPLY TO RASH TWICE DAILY, Disp: 90 g, Rfl: 0 .  pantoprazole (PROTONIX) 40 MG  tablet, Take 1 tablet (40 mg total) by mouth daily. (Patient taking differently: Take 40 mg by mouth as needed. ), Disp: 90 tablet, Rfl: 1 .  Pediatric Multivitamins-Iron (MULTIPLE VITAMINS-IRON PO), Take 1 tablet by mouth daily., Disp: , Rfl:  .  PREMARIN vaginal cream, Apply 1 application topically 3 (three) times a week., Disp: , Rfl: 12 .  raloxifene (EVISTA) 60 MG tablet, Take 1 tablet (60 mg total) by mouth daily., Disp: 90 tablet, Rfl: 4 .  triamcinolone cream (KENALOG) 0.5 %, Apply 1 application topically daily as  needed., Disp: , Rfl:  .  valACYclovir (VALTREX) 500 MG tablet, Take 1 tablet (500 mg total) by mouth 3 (three) times daily., Disp: 30 tablet, Rfl: 0 .  Vitamin D, Ergocalciferol, (DRISDOL) 50000 units CAPS capsule, Take 1 capsule (50,000 Units total) by mouth every 7 (seven) days., Disp: 12 capsule, Rfl: 1  Allergies  Allergen Reactions  . Topamax  [Topiramate]     severe depression  . Trazodone     Other reaction(s): Dizziness     ROS  Constitutional: Negative for fever or weight change.  Respiratory: Negative for cough and shortness of breath.   Cardiovascular: Negative for chest pain or palpitations.  Gastrointestinal: Negative for abdominal pain, no bowel changes.  Musculoskeletal: Negative for gait problem or joint swelling.  Skin: Negative for rash.  Neurological: Negative for dizziness or headache.  No other specific complaints in a complete review of systems (except as listed in HPI above).  Objective  Vitals:   11/01/15 1357  BP: 108/72  Pulse: 88  Resp: 16  Temp: 97.8 F (36.6 C)  TempSrc: Oral  SpO2: 97%  Weight: 217 lb 1.8 oz (98.5 kg)  Height: 5\' 3"  (1.6 m)    Body mass index is 38.46 kg/m.  Physical Exam  Constitutional: Patient appears well-developed and well-nourished. Obese  No distress.  HEENT: head atraumatic, normocephalic, pupils equal and reactive to light, neck supple, throat within normal limits Cardiovascular: Normal rate,  regular rhythm and normal heart sounds.  No murmur heard. No BLE edema. Pulmonary/Chest: Effort normal and breath sounds normal. No respiratory distress. Abdominal: Soft.  There is no tenderness. Psychiatric: Patient has a normal mood and affect. behavior is normal. Judgment and thought content normal.   PHQ2/9: Depression screen [facility] 2/9 11/01/2015 07/06/2015 03/23/2015 03/05/2015 12/07/2014  Decreased Interest 3 0 0 0 0  Down, Depressed, Hopeless 1 0 0 0 0  PHQ - 2 Score 4 0 0 0 0  Altered sleeping 3 - - - -  Tired, decreased energy 3 - - - -  Change in appetite 3 - - - -  Feeling bad or failure about yourself  3 - - - -  Trouble concentrating 0 - - - -  Moving slowly or fidgety/restless 0 - - - -  Suicidal thoughts 0 - - - -  PHQ-9 Score 16 - - - -  Difficult doing work/chores Not difficult at all - - - -     Fall Risk: Fall Risk  11/01/2015 07/06/2015 03/23/2015 03/05/2015 12/07/2014  Falls in the past year? No No Yes Yes Yes  Number falls in past yr: - - 1 1 1   Injury with Fall? - - - No No     Functional Status Survey: Is the patient deaf or have difficulty hearing?: No Does the patient have difficulty seeing, even when wearing glasses/contacts?: No Does the patient have difficulty concentrating, remembering, or making decisions?: No Does the patient have difficulty walking or climbing stairs?: No Does the patient have difficulty dressing or bathing?: No Does the patient have difficulty doing errands alone such as visiting a doctor's office or shopping?: No    Assessment & Plan  1. Dyslipidemia  On diet only   2. Needs flu shot  - Flu Vaccine QUAD 36+ mos PF IM (Fluarix & Fluzone Quad PF)  3. B12 deficiency  Continue supplementation   4. Hyperglycemia  hgbA1C is at goal    5. Vitamin D deficiency  Continue supplementation  6. Depression, major, recurrent,  moderate (HCC)  - DULoxetine (CYMBALTA) 30 MG capsule; Take 90 mg daily  Dispense: 270 capsule;  Refill: 0 - ALPRAZolam (XANAX) 0.5 MG tablet; Take 1 tablet (0.5 mg total) by mouth at bedtime as needed for anxiety.  Dispense: 30 tablet; Refill: 2  7. Osteoporosis without current pathological fracture, unspecified osteoporosis type  - raloxifene (EVISTA) 60 MG tablet; Take 1 tablet (60 mg total) by mouth daily.  Dispense: 90 tablet; Refill: 4  8. Perennial allergic rhinitis  Continue medication   9. Hematochezia  Advised GI referral but she would like to hold off for now - POC Hemoccult Bld/Stl (3-Cd Home Screen); Future

## 2015-11-05 ENCOUNTER — Ambulatory Visit: Payer: Federal, State, Local not specified - PPO | Admitting: Family Medicine

## 2015-11-11 ENCOUNTER — Other Ambulatory Visit: Payer: Self-pay

## 2015-11-11 DIAGNOSIS — K921 Melena: Secondary | ICD-10-CM

## 2015-11-11 LAB — POC HEMOCCULT BLD/STL (HOME/3-CARD/SCREEN)
Card #2 Fecal Occult Blod, POC: NEGATIVE
Card #3 Fecal Occult Blood, POC: NEGATIVE
Fecal Occult Blood, POC: NEGATIVE

## 2015-11-23 ENCOUNTER — Ambulatory Visit
Admission: RE | Admit: 2015-11-23 | Discharge: 2015-11-23 | Disposition: A | Discharge status: Home / Self Care | Payer: Federal, State, Local not specified - PPO | Source: Ambulatory Visit | Attending: Family Medicine | Admitting: Family Medicine

## 2015-11-23 DIAGNOSIS — Z1231 Encounter for screening mammogram for malignant neoplasm of breast: Secondary | ICD-10-CM | POA: Diagnosis not present

## 2015-12-19 ENCOUNTER — Other Ambulatory Visit: Payer: Self-pay | Admitting: Family Medicine

## 2015-12-19 DIAGNOSIS — E559 Vitamin D deficiency, unspecified: Secondary | ICD-10-CM

## 2016-01-11 ENCOUNTER — Ambulatory Visit
Admission: RE | Admit: 2016-01-11 | Discharge: 2016-01-11 | Disposition: A | Discharge status: Home / Self Care | Payer: Federal, State, Local not specified - PPO | Source: Ambulatory Visit | Attending: Family Medicine | Admitting: Family Medicine

## 2016-01-11 DIAGNOSIS — G8929 Other chronic pain: Secondary | ICD-10-CM | POA: Insufficient documentation

## 2016-01-11 DIAGNOSIS — M545 Low back pain: Secondary | ICD-10-CM | POA: Insufficient documentation

## 2016-01-11 DIAGNOSIS — M4317 Spondylolisthesis, lumbosacral region: Secondary | ICD-10-CM | POA: Insufficient documentation

## 2016-01-12 ENCOUNTER — Other Ambulatory Visit: Payer: Self-pay | Admitting: Family Medicine

## 2016-01-12 ENCOUNTER — Encounter: Payer: Self-pay | Admitting: Family Medicine

## 2016-01-12 DIAGNOSIS — M4317 Spondylolisthesis, lumbosacral region: Secondary | ICD-10-CM

## 2016-01-12 DIAGNOSIS — M4310 Spondylolisthesis, site unspecified: Secondary | ICD-10-CM | POA: Insufficient documentation

## 2016-01-18 ENCOUNTER — Telehealth: Payer: Self-pay | Admitting: Family Medicine

## 2016-01-18 NOTE — Telephone Encounter (Signed)
Pt states she needs a refill on Alprazolam. She is going out of town with in the next day or so for a weeks and was wanting to know if she could get this filled because she will run out while she away. Please advise.

## 2016-01-19 ENCOUNTER — Other Ambulatory Visit: Payer: Self-pay | Admitting: Family Medicine

## 2016-01-19 DIAGNOSIS — F331 Major depressive disorder, recurrent, moderate: Secondary | ICD-10-CM

## 2016-01-22 ENCOUNTER — Other Ambulatory Visit: Payer: Self-pay | Admitting: Family Medicine

## 2016-01-22 DIAGNOSIS — F331 Major depressive disorder, recurrent, moderate: Secondary | ICD-10-CM

## 2016-01-22 MED ORDER — DULOXETINE HCL 30 MG PO CPEP: ORAL_CAPSULE | ORAL | 0 refills | Status: DC

## 2016-01-22 NOTE — Telephone Encounter (Signed)
We can call it in on 01/08 to the pharmacy of her choice for one month supply

## 2016-01-28 ENCOUNTER — Telehealth: Payer: Self-pay | Admitting: Family Medicine

## 2016-01-28 ENCOUNTER — Other Ambulatory Visit: Payer: Self-pay | Admitting: Family Medicine

## 2016-01-28 NOTE — Telephone Encounter (Signed)
Pt has made appt for med refill for jan 22 ( the first avail) but will be out of her medication on jan. 12th. She is needing alprazolam, also her evista, cymbalta. [facility] [place]

## 2016-01-28 NOTE — Telephone Encounter (Signed)
Patient notified

## 2016-01-28 NOTE — Telephone Encounter (Signed)
Sent 90 days of Evista and Cymbalta already, alprazolam is controlled, ask her to cut dose in half so it will last until follow up

## 2016-01-31 NOTE — Telephone Encounter (Signed)
Called in a 30 day supply for [name] to [facility]. Patient was also notified.

## 2016-02-02 ENCOUNTER — Ambulatory Visit
Admission: RE | Admit: 2016-02-02 | Discharge: 2016-02-02 | Disposition: A | Discharge status: Home / Self Care | Payer: Federal, State, Local not specified - PPO | Source: Ambulatory Visit | Attending: Family Medicine | Admitting: Family Medicine

## 2016-02-02 ENCOUNTER — Encounter: Payer: Self-pay | Admitting: Family Medicine

## 2016-02-02 DIAGNOSIS — M4316 Spondylolisthesis, lumbar region: Secondary | ICD-10-CM | POA: Insufficient documentation

## 2016-02-02 DIAGNOSIS — M4317 Spondylolisthesis, lumbosacral region: Secondary | ICD-10-CM | POA: Diagnosis present

## 2016-02-02 DIAGNOSIS — M5136 Other intervertebral disc degeneration, lumbar region: Secondary | ICD-10-CM | POA: Insufficient documentation

## 2016-02-02 DIAGNOSIS — M47816 Spondylosis without myelopathy or radiculopathy, lumbar region: Secondary | ICD-10-CM | POA: Insufficient documentation

## 2016-02-14 ENCOUNTER — Ambulatory Visit (INDEPENDENT_AMBULATORY_CARE_PROVIDER_SITE_OTHER): Payer: Federal, State, Local not specified - PPO | Admitting: Family Medicine

## 2016-02-14 ENCOUNTER — Encounter: Payer: Self-pay | Admitting: Family Medicine

## 2016-02-14 VITALS — BP 110/78 | HR 91 | Temp 98.6°F | Resp 16 | Ht 63.0 in | Wt 223.4 lb

## 2016-02-14 DIAGNOSIS — M1288 Other specific arthropathies, not elsewhere classified, other specified site: Secondary | ICD-10-CM | POA: Diagnosis not present

## 2016-02-14 DIAGNOSIS — M4317 Spondylolisthesis, lumbosacral region: Secondary | ICD-10-CM

## 2016-02-14 DIAGNOSIS — F331 Major depressive disorder, recurrent, moderate: Secondary | ICD-10-CM

## 2016-02-14 DIAGNOSIS — Z9884 Bariatric surgery status: Secondary | ICD-10-CM

## 2016-02-14 DIAGNOSIS — M545 Low back pain, unspecified: Secondary | ICD-10-CM

## 2016-02-14 DIAGNOSIS — E538 Deficiency of other specified B group vitamins: Secondary | ICD-10-CM | POA: Diagnosis not present

## 2016-02-14 DIAGNOSIS — E785 Hyperlipidemia, unspecified: Secondary | ICD-10-CM

## 2016-02-14 DIAGNOSIS — M47816 Spondylosis without myelopathy or radiculopathy, lumbar region: Secondary | ICD-10-CM

## 2016-02-14 DIAGNOSIS — M62830 Muscle spasm of back: Secondary | ICD-10-CM

## 2016-02-14 DIAGNOSIS — G8929 Other chronic pain: Secondary | ICD-10-CM

## 2016-02-14 MED ORDER — CYCLOBENZAPRINE HCL 10 MG PO TABS
5.0000 mg | ORAL_TABLET | Freq: Every day | ORAL | 2 refills | Status: DC
Start: 2016-02-14 — End: 2016-05-15

## 2016-02-14 MED ORDER — CYANOCOBALAMIN 1000 MCG/ML IJ SOLN: 1000.0000 ug | Freq: Once | INTRAMUSCULAR | 0 refills | Status: AC

## 2016-02-14 MED ORDER — ALPRAZOLAM 0.5 MG PO TABS: 0.5000 mg | ORAL_TABLET | Freq: Every evening | ORAL | 0 refills | Status: DC | PRN

## 2016-02-14 MED ORDER — CYANOCOBALAMIN 1000 MCG/ML IJ SOLN
1000.0000 ug | Freq: Once | INTRAMUSCULAR | Status: AC
  Administered 2016-02-14: 1000 ug via INTRAMUSCULAR

## 2016-02-14 MED ORDER — DULOXETINE HCL 30 MG PO CPEP: ORAL_CAPSULE | ORAL | 0 refills | Status: DC

## 2016-02-14 NOTE — Progress Notes (Signed)
Name: Terri Castillo   MRN: [id number]    DOB: 08-03-1950   Date:02/14/2016       Progress Note  Subjective  Chief Complaint  Chief Complaint  Patient presents with  . Medication Refill  . Referral    pt would like a neurology referral    HPI  Depression Moderate Recurrent: she is currently taking Cymbalta and Alprazolam prn. She thinks her mood is more melow, not as irritable, she states anhedonia has been worse since she lost both of her dogs over the Summer, also misses hanging out with her sister that is now busy taking care of her own grandchildren.Terri Castillo has failed Prozac, Wellbutrin and changed from Lexapro because daughter was worried about meningioma.She states she has gained weight because she is eating to control her stress, she states sleep has been getting worse, unable to fall asleep at times, we will try stopping Alprazolam and try Flexeril since she is having muscle spasms.   Vitamin D : slightly low, history of osteoporosis so we will give rx vitamin D  Osteoporosis: she took Forteo for 2 years around 2009, currently on Evista, bone density showed osteopenia, continue Evista and recheck in 2 years.   Blood in stools: she has noticed that she has to strain to have a bowel movement and has noticed blood when she wipes. Her last colonoscopy was normal in 2014. She had 3 negative hemoccult stools 10/2015. No recent episodes of constipation   Hyperlipidemia:Based on the results of lipid panel his cardiovascular risk factor ( using [facility] Cohort ) in the next 10 years is :4.3% and we will keep her off statin therapy for now.   GERD: off medication and is doing well   History of bariatric surgery: she had gastric bypass in 2010, her lowest weight was 180 lbs, but has gradually gained weight since. She has eliminated sugar and is feeling better, she has gained a 5 more pounds  since last visit, and she gets frustrated  B12: stable level, feeling very tired,  history of bariatric surgery  Hyperglycemia: denies polydipsia, polyuria or polyphagia.   Patient Active Problem List   Diagnosis Date Noted  . Facet arthropathy, lumbar 02/02/2016  . Spondylisthesis 01/12/2016  . Chronic bilateral low back pain 11/01/2015  . Iron deficiency anemia 09/04/2014  . Hyperglycemia 09/04/2014  . Perioral dermatitis 09/04/2014  . 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 B12 deficiency 07/02/2014  . Cervical pain 07/02/2014  . Arthralgia of lower leg 07/02/2014  . Insomnia 07/02/2014  . 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 Dyslipidemia 07/02/2014  . Dermatitis, eczematoid 07/02/2014  . Genital herpes in women 07/02/2014  . Gastric reflux 07/02/2014  . Bariatric surgery status 07/02/2014  . Herpes simplex 07/02/2014  . H/O: pneumonia 07/02/2014  . Hypoglycemia 07/02/2014  . Eczema intertrigo 07/02/2014  . Adult BMI 30+ 07/02/2014  . OP (osteoporosis) 07/02/2014  . 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 Perennial allergic rhinitis with seasonal variation 07/02/2014  . Basal cell papilloma 07/02/2014  . Vitamin D deficiency 07/02/2014    Past Surgical History:  Procedure Laterality Date  . ABDOMINAL HYSTERECTOMY  1994  . CHOLECYSTECTOMY  2000  . EYE SURGERY  2000   Cataract Removal  . EYE SURGERY  2002   Cataract Removal  . GASTRIC BYPASS  2000   Mini  . GASTRIC BYPASS  2010    Family History  Problem Relation Age of Onset  . Dementia Mother   . COPD Mother   .  Osteoporosis Mother   . Diabetes Father   . Glaucoma Father   . Parkinson's disease Father   . Glaucoma Brother   . Heart disease Brother   . Heart Problems Brother   . Brain cancer Daughter     Brain Tumor  . Hypothyroidism Daughter     Social History   Social History  . Marital status: Divorced    Spouse name: N/A  . Number of children: N/A  . Years of education: N/A   Occupational History  . Not on file.   Social History Main Topics  . Smoking status: Former  Smoker    Packs/day: 0.50    Years: 5.00    Types: Cigarettes    Start date: 01/23/1985    Quit date: 01/23/1990  . Smokeless tobacco: Never Used  . Alcohol use No  . Drug use: No  . Sexual activity: Not Currently   Other Topics Concern  . Not on file   Social History Narrative  . No narrative on file     Current Outpatient Prescriptions:  .  acetaminophen (TYLENOL) 500 MG tablet, Take 1 tablet by mouth 4 (four) times daily as needed., Disp: , Rfl:  .  ALPRAZolam (XANAX) 0.5 MG tablet, Take 1 tablet (0.5 mg total) by mouth at bedtime as needed for anxiety., Disp: 30 tablet, Rfl: 0 .  calcium-vitamin D (OSCAL WITH D) 500-200 MG-UNIT per tablet, Take 1 tablet by mouth 2 (two) times daily., Disp: 60 tablet, Rfl: 0 .  cetirizine (ZYRTEC) 10 MG tablet, Take 10 mg by mouth daily., Disp: , Rfl:  .  cyclobenzaprine (FLEXERIL) 10 MG tablet, Take 0.5-1 tablets (5-10 mg total) by mouth at bedtime., Disp: 30 tablet, Rfl: 2 .  DULoxetine (CYMBALTA) 30 MG capsule, Take 90 mg daily, Disp: 270 capsule, Rfl: 0 .  fluticasone (FLONASE) 50 MCG/ACT nasal spray, Place 2 sprays into both nostrils at bedtime., Disp: 48 g, Rfl: 1 .  ketoconazole (NIZORAL) 2 % cream, APPLY TO RASH TWICE DAILY, Disp: 90 g, Rfl: 0 .  Pediatric Multivitamins-Iron (MULTIPLE VITAMINS-IRON PO), Take 1 tablet by mouth daily., Disp: , Rfl:  .  PREMARIN vaginal cream, Apply 1 application topically 3 (three) times a week., Disp: , Rfl: 12 .  raloxifene (EVISTA) 60 MG tablet, Take 1 tablet (60 mg total) by mouth daily., Disp: 90 tablet, Rfl: 4 .  triamcinolone cream (KENALOG) 0.5 %, Apply 1 application topically daily as needed., Disp: , Rfl:  .  valACYclovir (VALTREX) 500 MG tablet, Take 1 tablet (500 mg total) by mouth 3 (three) times daily., Disp: 30 tablet, Rfl: 0 .  Vitamin D, Ergocalciferol, (DRISDOL) 50000 units CAPS capsule, TAKE 1 CAPSULE (50,000 UNITS TOTAL) BY MOUTH EVERY 7 (SEVEN) DAYS., Disp: 12 capsule, Rfl: 1  Allergies   Allergen Reactions  . Topamax  [Topiramate]     severe depression  . Trazodone     Other reaction(s): Dizziness     ROS  Constitutional: Negative for fever, positive for weight change.  Respiratory: Negative for cough and shortness of breath.   Cardiovascular: Negative for chest pain or palpitations.  Gastrointestinal: Negative for abdominal pain, no bowel changes.  Musculoskeletal: Negative for gait problem or joint swelling.  Skin: Negative for rash.  Neurological: Negative for dizziness or headache.  No other specific complaints in a complete review of systems (except as listed in HPI above).  Objective  Vitals:   02/14/16 1459  BP: 110/78  Pulse: 91  Resp: 16  Temp:  98.6 F (37 C)  SpO2: 98%  Weight: 223 lb 6 oz (101.3 kg)  Height: 5\' 3"  (1.6 m)    Body mass index is 39.57 kg/m.  Physical Exam  Constitutional: Patient appears well-developed and well-nourished. Obese  No distress.  HEENT: head atraumatic, normocephalic, pupils equal and reactive to light,  neck supple, throat within normal limits Cardiovascular: Normal rate, regular rhythm and normal heart sounds.  No murmur heard. No BLE edema. Pulmonary/Chest: Effort normal and breath sounds normal. No respiratory distress. Abdominal: Soft.  There is no tenderness. Psychiatric: Patient has a normal mood and affect. behavior is normal. Judgment and thought content normal. Muscular Skeletal: pain during palpation of lumbar spine, negative straight leg raise  PHQ2/9: Depression screen [facility] 2/9 02/14/2016 11/01/2015 07/06/2015 03/23/2015 03/05/2015  Decreased Interest 1 3 0 0 0  Down, Depressed, Hopeless 1 1 0 0 0  PHQ - 2 Score 2 4 0 0 0  Altered sleeping 1 3 - - -  Tired, decreased energy 1 3 - - -  Change in appetite 0 3 - - -  Feeling bad or failure about yourself  0 3 - - -  Trouble concentrating 0 0 - - -  Moving slowly or fidgety/restless 0 0 - - -  Suicidal thoughts 0 0 - - -  PHQ-9 Score 4 16 - - -   Difficult doing work/chores Somewhat difficult Not difficult at all - - -     Fall Risk: Fall Risk  02/14/2016 11/01/2015 07/06/2015 03/23/2015 03/05/2015  Falls in the past year? No No No Yes Yes  Number falls in past yr: - - - 1 1  Injury with Fall? - - - - No     Functional Status Survey: Is the patient deaf or have difficulty hearing?: No Does the patient have difficulty seeing, even when wearing glasses/contacts?: No Does the patient have difficulty concentrating, remembering, or making decisions?: No Does the patient have difficulty walking or climbing stairs?: No Does the patient have difficulty dressing or bathing?: No Does the patient have difficulty doing errands alone such as visiting a doctor's office or shopping?: No   Assessment & Plan  1. Chronic bilateral low back pain without sciatica  - Ambulatory referral to Neurosurgery  2. Spondylolisthesis of lumbosacral region  - Ambulatory referral to Neurosurgery  3. Facet arthropathy, lumbar  - Ambulatory referral to Neurosurgery  4. Depression, major, recurrent, moderate (HCC)  - DULoxetine (CYMBALTA) 30 MG capsule; Take 90 mg daily  Dispense: 270 capsule; Refill: 0 - ALPRAZolam (XANAX) 0.5 MG tablet; Take 1 tablet (0.5 mg total) by mouth at bedtime as needed for anxiety.  Dispense: 30 tablet; Refill: 0  5. Dyslipidemia  On life style modification   6. Bariatric surgery status  She continues to gain weight, sitting at home, not exercising.  7. Spasm of muscle, back  - cyclobenzaprine (FLEXERIL) 10 MG tablet; Take 0.5-1 tablets (5-10 mg total) by mouth at bedtime.  Dispense: 30 tablet; Refill: 2  8. B12 deficiency  - cyanocobalamin (,VITAMIN B-12,) 1000 MCG/ML injection; Inject 1 mL (1,000 mcg total) into the muscle once.  Dispense: 1 mL; Refill: 0

## 2016-02-14 NOTE — Addendum Note (Signed)
Addended by: Cynda FamiliaJOHNSON, Raynisha Avilla L on: 02/14/2016 04:15 PM   Modules accepted: Orders

## 2016-02-15 ENCOUNTER — Encounter: Payer: Self-pay | Admitting: Family Medicine

## 2016-02-16 ENCOUNTER — Encounter: Payer: Self-pay | Admitting: Family Medicine

## 2016-05-09 ENCOUNTER — Other Ambulatory Visit: Payer: Self-pay

## 2016-05-09 NOTE — Telephone Encounter (Signed)
Pharmacy is requesting refill of Cyanocobalamin 1,[facility] mcg[facility]ml [facility] supply.

## 2016-05-15 ENCOUNTER — Ambulatory Visit (INDEPENDENT_AMBULATORY_CARE_PROVIDER_SITE_OTHER): Payer: Federal, State, Local not specified - PPO | Admitting: Family Medicine

## 2016-05-15 ENCOUNTER — Encounter: Payer: Self-pay | Admitting: Family Medicine

## 2016-05-15 ENCOUNTER — Other Ambulatory Visit: Payer: Self-pay | Admitting: Family Medicine

## 2016-05-15 VITALS — BP 114/64 | HR 103 | Temp 98.3°F | Resp 16 | Ht 63.0 in | Wt 227.4 lb

## 2016-05-15 DIAGNOSIS — E559 Vitamin D deficiency, unspecified: Secondary | ICD-10-CM

## 2016-05-15 DIAGNOSIS — F5101 Primary insomnia: Secondary | ICD-10-CM | POA: Diagnosis not present

## 2016-05-15 DIAGNOSIS — R739 Hyperglycemia, unspecified: Secondary | ICD-10-CM

## 2016-05-15 DIAGNOSIS — F331 Major depressive disorder, recurrent, moderate: Secondary | ICD-10-CM | POA: Diagnosis not present

## 2016-05-15 DIAGNOSIS — Z9884 Bariatric surgery status: Secondary | ICD-10-CM | POA: Diagnosis not present

## 2016-05-15 DIAGNOSIS — M545 Low back pain, unspecified: Secondary | ICD-10-CM

## 2016-05-15 DIAGNOSIS — M810 Age-related osteoporosis without current pathological fracture: Secondary | ICD-10-CM | POA: Diagnosis not present

## 2016-05-15 DIAGNOSIS — M62830 Muscle spasm of back: Secondary | ICD-10-CM | POA: Diagnosis not present

## 2016-05-15 DIAGNOSIS — E669 Obesity, unspecified: Secondary | ICD-10-CM

## 2016-05-15 DIAGNOSIS — Z862 Personal history of diseases of the blood and blood-forming organs and certain disorders involving the immune mechanism: Secondary | ICD-10-CM

## 2016-05-15 DIAGNOSIS — Z79899 Other long term (current) drug therapy: Secondary | ICD-10-CM

## 2016-05-15 DIAGNOSIS — M47816 Spondylosis without myelopathy or radiculopathy, lumbar region: Secondary | ICD-10-CM

## 2016-05-15 DIAGNOSIS — E785 Hyperlipidemia, unspecified: Secondary | ICD-10-CM

## 2016-05-15 DIAGNOSIS — M4696 Unspecified inflammatory spondylopathy, lumbar region: Secondary | ICD-10-CM | POA: Diagnosis not present

## 2016-05-15 DIAGNOSIS — R635 Abnormal weight gain: Secondary | ICD-10-CM

## 2016-05-15 DIAGNOSIS — E538 Deficiency of other specified B group vitamins: Secondary | ICD-10-CM | POA: Diagnosis not present

## 2016-05-15 DIAGNOSIS — G8929 Other chronic pain: Secondary | ICD-10-CM

## 2016-05-15 DIAGNOSIS — L304 Erythema intertrigo: Secondary | ICD-10-CM | POA: Diagnosis not present

## 2016-05-15 MED ORDER — CYCLOBENZAPRINE HCL 10 MG PO TABS: 5.0000 mg | ORAL_TABLET | Freq: Every day | ORAL | 1 refills | Status: DC

## 2016-05-15 MED ORDER — KETOCONAZOLE 2 % EX CREA: TOPICAL_CREAM | CUTANEOUS | 0 refills | Status: DC

## 2016-05-15 MED ORDER — ESCITALOPRAM OXALATE 10 MG PO TABS: 10.0000 mg | ORAL_TABLET | Freq: Every day | ORAL | 1 refills | Status: DC

## 2016-05-15 MED ORDER — VITAMIN D (ERGOCALCIFEROL) 1.25 MG (50000 UNIT) PO CAPS: 50000.0000 [IU] | ORAL_CAPSULE | ORAL | 1 refills | Status: DC

## 2016-05-15 NOTE — Telephone Encounter (Signed)
Patient requesting [facility] of [facility] 

## 2016-05-15 NOTE — Progress Notes (Signed)
Name: Terri Castillo   MRN: [id number]    DOB: January 12, 1951   Date:05/15/2016       Progress Note  Subjective  Chief Complaint  Chief Complaint  Patient presents with  . Hyperlipidemia    3 month follow up  . Depression  . Pain    HPI  Depression ModerateRecurrent: she is currently taking Cymbalta   and Alprazolam prn. She thinks her mood is more melow, not as irritable, she statesanhedonia has been worse since she lost both of her dogs over the Summer, also misses hanging out with her sister that is now busy taking care of her own grandchildren. Tanishi has failed Prozac, Wellbutrin and changed from Lexapro because daughter was worried about meningioma, but she would like to try Lexapro again since it worked well for her and Cymbalta is a higher tier..She states she has gained weight because she is eating to control her stress, she states sleep has been getting worse, unable to fall asleep at times but that has been better controlled with Flexeril   Vitamin D : slightly low, history of osteoporosis so we will give rx vitamin D  Osteoporosis: she took Forteo for 2 years around 2009, currently on Evista, bone density showed osteopenia, continue Evista   Blood in stools: she has noticed that she has to strain to have a bowel movement and has noticed blood when she wipes. Her last colonoscopy was normal in 2014. She had 3 negative hemoccult stools 10/2015. No recent episodes of constipation   Hyperlipidemia:Based on the results of lipid panel his cardiovascular risk factor ( using [place] Cohort ) in the next 10 years is :4.3% and we will keep her off statin therapy for now.   GERD: off medication but she states she has been burping more lately, advised to stop NSAId's  History of bariatric surgery: she had gastric bypass in 2010, her lowest weight was 180 lbs, but has gradually gained weight since. She has eliminated sugar and is feeling better, she has gained a 4 more  pounds  since last visit, and she gets frustrated  B12: stable level, feeling very tired, history of bariatric surgery  Hyperglycemia:denies polydipsia, polyuria but she has  Polyphagia - she states she has an addiction for food - she thinks about it all the time.  Back pain: going on for at least one year, symptoms got progressively worse, pain is described as aching, worse when standing or walking , she saw Dr. Venetia Maxon - neurosurgeon and was started on Diclofenac, she states pain has improved, no GI side effects. She used to see chiropractor many years ago. She has spondylolisthesis lumbar spine  Patient Active Problem List   Diagnosis Date Noted  . Facet arthropathy, lumbar (HCC) 02/02/2016  . Spondylisthesis 01/12/2016  . Chronic bilateral low back pain 11/01/2015  . Iron deficiency anemia 09/04/2014  . Hyperglycemia 09/04/2014  . Perioral dermatitis 09/04/2014  . 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 B12 deficiency 07/02/2014  . Cervical pain 07/02/2014  . Arthralgia of lower leg 07/02/2014  . Insomnia 07/02/2014  . 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 Dyslipidemia 07/02/2014  . Dermatitis, eczematoid 07/02/2014  . Genital herpes in women 07/02/2014  . Gastric reflux 07/02/2014  . Bariatric surgery status 07/02/2014  . Herpes simplex 07/02/2014  . H/O: pneumonia 07/02/2014  . Hypoglycemia 07/02/2014  . Eczema intertrigo 07/02/2014  . Adult BMI 30+ 07/02/2014  . OP (osteoporosis) 07/02/2014  . 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 Perennial allergic rhinitis with  seasonal variation 07/02/2014  . Basal cell papilloma 07/02/2014  . Vitamin D deficiency 07/02/2014    Past Surgical History:  Procedure Laterality Date  . ABDOMINAL HYSTERECTOMY  1994  . CHOLECYSTECTOMY  2000  . EYE SURGERY  2000   Cataract Removal  . EYE SURGERY  2002   Cataract Removal  . GASTRIC BYPASS  2000   Mini  . GASTRIC BYPASS  2010    Family History  Problem Relation Age of Onset  .  Dementia Mother   . COPD Mother   . Osteoporosis Mother   . Diabetes Father   . Glaucoma Father   . Parkinson's disease Father   . Glaucoma Brother   . Heart disease Brother   . Heart Problems Brother   . Brain cancer Daughter     Brain Tumor  . Hypothyroidism Daughter     Social History   Social History  . Marital status: Divorced    Spouse name: N/A  . Number of children: N/A  . Years of education: N/A   Occupational History  . Not on file.   Social History Main Topics  . Smoking status: Former Smoker    Packs/day: 0.50    Years: 5.00    Types: Cigarettes    Start date: 01/23/1985    Quit date: 01/23/1990  . Smokeless tobacco: Never Used  . Alcohol use No  . Drug use: No  . Sexual activity: Not Currently   Other Topics Concern  . Not on file   Social History Narrative  . No narrative on file     Current Outpatient Prescriptions:  .  diclofenac (VOLTAREN) 75 MG EC tablet, Take 75 mg by mouth 2 (two) times daily., Disp: , Rfl:  .  acetaminophen (TYLENOL) 500 MG tablet, Take 1 tablet by mouth 4 (four) times daily as needed., Disp: , Rfl:  .  ALPRAZolam (XANAX) 0.5 MG tablet, Take 1 tablet (0.5 mg total) by mouth at bedtime as needed for anxiety., Disp: 30 tablet, Rfl: 0 .  calcium-vitamin D (OSCAL WITH D) 500-200 MG-UNIT per tablet, Take 1 tablet by mouth 2 (two) times daily., Disp: 60 tablet, Rfl: 0 .  cetirizine (ZYRTEC) 10 MG tablet, Take 10 mg by mouth daily., Disp: , Rfl:  .  cyclobenzaprine (FLEXERIL) 10 MG tablet, Take 0.5-1 tablets (5-10 mg total) by mouth at bedtime., Disp: 30 tablet, Rfl: 2 .  DULoxetine (CYMBALTA) 30 MG capsule, Take 90 mg daily, Disp: 270 capsule, Rfl: 0 .  fluticasone (FLONASE) 50 MCG/ACT nasal spray, Place 2 sprays into both nostrils at bedtime., Disp: 48 g, Rfl: 1 .  ketoconazole (NIZORAL) 2 % cream, APPLY TO RASH TWICE DAILY, Disp: 90 g, Rfl: 0 .  Pediatric Multivitamins-Iron (MULTIPLE VITAMINS-IRON PO), Take 1 tablet by mouth daily.,  Disp: , Rfl:  .  PREMARIN vaginal cream, Apply 1 application topically 3 (three) times a week., Disp: , Rfl: 12 .  raloxifene (EVISTA) 60 MG tablet, Take 1 tablet (60 mg total) by mouth daily., Disp: 90 tablet, Rfl: 4 .  triamcinolone cream (KENALOG) 0.5 %, Apply 1 application topically daily as needed., Disp: , Rfl:  .  valACYclovir (VALTREX) 500 MG tablet, Take 1 tablet (500 mg total) by mouth 3 (three) times daily., Disp: 30 tablet, Rfl: 0 .  Vitamin D, Ergocalciferol, (DRISDOL) 50000 units CAPS capsule, TAKE 1 CAPSULE (50,000 UNITS TOTAL) BY MOUTH EVERY 7 (SEVEN) DAYS., Disp: 12 capsule, Rfl: 1  Allergies  Allergen Reactions  . Topamax  [  Topiramate]     severe depression  . Trazodone     Other reaction(s): Dizziness     ROS  Constitutional: Negative for fever, positive for weight change.  Respiratory: Negative for cough, she has shortness of breath with activity .   Cardiovascular: Negative for chest pain or palpitations.  Gastrointestinal: Negative for abdominal pain, no bowel changes.  Musculoskeletal: Negative for gait problem or joint swelling.  Skin: positive for  Rash intermittently on her groin.  Neurological: Negative for dizziness or headache.  No other specific complaints in a complete review of systems (except as listed in HPI above).  Objective  Vitals:   05/15/16 0859  BP: 114/64  Pulse: (!) 103  Resp: 16  Temp: 98.3 F (36.8 C)  SpO2: 99%  Weight: 227 lb 6 oz (103.1 kg)  Height:  (1.6 m)    Body mass index is 40.28 kg/m.  Physical Exam  Constitutional: Patient appears well-developed and well-nourished. Obese  No distress.  HEENT: head atraumatic, normocephalic, pupils equal and reactive to light, neck supple, throat within normal limits Cardiovascular: Normal rate, regular rhythm and normal heart sounds.  No murmur heard. No BLE edema. Pulmonary/Chest: Effort normal and breath sounds normal. No respiratory distress. Abdominal: Soft.  There is  no tenderness. Psychiatric: Patient has a normal mood and affect. behavior is normal. Judgment and thought content normal. Muscular Skeletal: pain during palpation of left lumbar spine, negative straight leg raise  PHQ2/9: Depression screen [facility] 2/9 02/14/2016 11/01/2015 07/06/2015 03/23/2015 03/05/2015  Decreased Interest 1 3 0 0 0  Down, Depressed, Hopeless 1 1 0 0 0  PHQ - 2 Score 2 4 0 0 0  Altered sleeping 1 3 - - -  Tired, decreased energy 1 3 - - -  Change in appetite 0 3 - - -  Feeling bad or failure about yourself  0 3 - - -  Trouble concentrating 0 0 - - -  Moving slowly or fidgety/restless 0 0 - - -  Suicidal thoughts 0 0 - - -  PHQ-9 Score 4 16 - - -  Difficult doing work/chores Somewhat difficult Not difficult at all - - -     Fall Risk: Fall Risk  02/14/2016 11/01/2015 07/06/2015 03/23/2015 03/05/2015  Falls in the past year? No No No Yes Yes  Number falls in past yr: - - - 1 1  Injury with Fall? - - - - No    Assessment & Plan    1. Chronic bilateral low back pain without sciatica  Seen by Dr. Venetia Maxon  2. Facet arthropathy, lumbar (HCC)  Seen by Dr. Venetia Maxon, her niece works at his practice, she was given Diclofenac and is feeling better, but explained she should not be on NSAID's  3. Depression, major, recurrent, moderate (HCC)  She is not in remission, she would like to resume Lexapro, because of cost and to see if mood will improve - escitalopram (LEXAPRO) 10 MG tablet; Take 1 tablet (10 mg total) by mouth daily.  Dispense: 90 tablet; Refill: 1 She stopped Alprazolam back in January 2018   4. Dyslipidemia  - Lipid panel  5. Bariatric surgery status  - VITAMIN D 25 Hydroxy (Vit-D Deficiency, Fractures) - Vitamin B12  6. B12 deficiency  Recheck level  - Vitamin B12  7. Vitamin D deficiency  - Vitamin D, Ergocalciferol, (DRISDOL) 50000 units CAPS capsule; Take 1 capsule (50,000 Units total) by mouth every 7 (seven) days.  Dispense: 12 capsule; Refill:  1  8. Primary insomnia  Doing better, off Ambien and sleeping with Flexeril   9. Spasm of muscle, back  - cyclobenzaprine (FLEXERIL) 10 MG tablet; Take 0.5-1 tablets (5-10 mg total) by mouth at bedtime.  Dispense: 90 tablet; Refill: 1  10. Intertrigo  - ketoconazole (NIZORAL) 2 % cream; APPLY TO RASH TWICE DAILY  Dispense: 90 g; Refill: 0  11. Hyperglycemia  - Hemoglobin A1c - Insulin, fasting  12. Osteoporosis without current pathological fracture, unspecified osteoporosis type  Taking Evista , she finished Forteo  13. Obesity, Class II, BMI 35-39.9  - Insulin, fasting  14. History of iron deficiency anemia  - CBC with Differential/Platelet - Iron, TIBC and Ferritin Panel  15. Long-term use of high-risk medication  - COMPLETE METABOLIC PANEL WITH GFR  16. Weight gain  - TSH

## 2016-06-25 ENCOUNTER — Encounter: Payer: Self-pay | Admitting: Family Medicine

## 2016-06-28 ENCOUNTER — Other Ambulatory Visit: Payer: Self-pay | Admitting: Family Medicine

## 2016-06-28 LAB — CBC WITH DIFFERENTIAL/PLATELET
BASOS ABS: 45 {cells}/uL (ref 0–200)
Basophils Relative: 1 %
EOS PCT: 4 %
Eosinophils Absolute: 180 cells/uL (ref 15–500)
HEMATOCRIT: 42.2 % (ref 35.0–45.0)
Hemoglobin: 13.7 g/dL (ref 11.7–15.5)
LYMPHS ABS: 1260 {cells}/uL (ref 850–3900)
Lymphocytes Relative: 28 %
MCH: 29.4 pg (ref 27.0–33.0)
MCHC: 32.5 g/dL (ref 32.0–36.0)
MCV: 90.6 fL (ref 80.0–100.0)
MONO ABS: 450 {cells}/uL (ref 200–950)
MPV: 9.5 fL (ref 7.5–12.5)
Monocytes Relative: 10 %
NEUTROS ABS: 2565 {cells}/uL (ref 1500–7800)
NEUTROS PCT: 57 %
Platelets: 337 10*3/uL (ref 140–400)
RBC: 4.66 MIL/uL (ref 3.80–5.10)
RDW: 14.3 % (ref 11.0–15.0)
WBC: 4.5 10*3/uL (ref 3.8–10.8)

## 2016-06-29 LAB — COMPLETE METABOLIC PANEL WITH GFR
ALT: 18 U/L (ref 6–29)
AST: 19 U/L (ref 10–35)
Albumin: 3.6 g/dL (ref 3.6–5.1)
Alkaline Phosphatase: 107 U/L (ref 33–130)
BUN: 13 mg/dL (ref 7–25)
CO2: 22 mmol/L (ref 20–31)
Calcium: 8.5 mg/dL — ABNORMAL LOW (ref 8.6–10.4)
Chloride: 106 mmol/L (ref 98–110)
Creat: 0.77 mg/dL (ref 0.50–0.99)
GFR, EST NON AFRICAN AMERICAN: 82 mL/min (ref >=60)
GFR, Est African American: >89 mL/min (ref >=60)
GLUCOSE: 94 mg/dL (ref 65–99)
POTASSIUM: 4.3 mmol/L (ref 3.5–5.3)
Sodium: 139 mmol/L (ref 135–146)
Total Bilirubin: 0.4 mg/dL (ref 0.2–1.2)
Total Protein: 6.7 g/dL (ref 6.1–8.1)

## 2016-06-29 LAB — LIPID PANEL
CHOL/HDL RATIO: 3.8 ratio (ref <5.0)
CHOLESTEROL: 160 mg/dL (ref <200)
HDL: 42 mg/dL — ABNORMAL LOW (ref >50)
LDL CALC: 89 mg/dL (ref <100)
TRIGLYCERIDES: 147 mg/dL (ref <150)
VLDL: 29 mg/dL (ref <30)

## 2016-06-29 LAB — VITAMIN D 25 HYDROXY (VIT D DEFICIENCY, FRACTURES): VIT D 25 HYDROXY: 28 ng/mL — AB (ref 30–100)

## 2016-06-29 LAB — IRON,TIBC AND FERRITIN PANEL
%SAT: 20 % (ref 11–50)
Ferritin: 13 ng/mL — ABNORMAL LOW (ref 20–288)
IRON: 83 ug/dL (ref 45–160)
TIBC: 414 ug/dL (ref 250–450)

## 2016-06-29 LAB — INSULIN, FASTING: Insulin fasting, serum: 9.7 u[IU]/mL (ref 2.0–19.6)

## 2016-06-29 LAB — TSH: TSH: 5.25 mIU/L — ABNORMAL HIGH

## 2016-06-29 LAB — VITAMIN B12: VITAMIN B 12: 322 pg/mL (ref 200–1100)

## 2016-06-29 LAB — HEMOGLOBIN A1C
HEMOGLOBIN A1C: 5.3 % (ref <5.7)
MEAN PLASMA GLUCOSE: 105 mg/dL

## 2016-07-30 ENCOUNTER — Encounter: Payer: Self-pay | Admitting: Family Medicine

## 2016-07-31 ENCOUNTER — Other Ambulatory Visit: Payer: Self-pay | Admitting: Family Medicine

## 2016-07-31 ENCOUNTER — Other Ambulatory Visit: Payer: Self-pay

## 2016-07-31 DIAGNOSIS — R7989 Other specified abnormal findings of blood chemistry: Secondary | ICD-10-CM

## 2016-08-03 LAB — THYROID PANEL WITH TSH
Free Thyroxine Index: 1.9 (ref 1.4–3.8)
T3 Uptake: 26 % (ref 22–35)
T4, Total: 7.2 ug/dL (ref 4.5–12.0)
TSH: 5.21 mIU/L — ABNORMAL HIGH

## 2016-08-11 ENCOUNTER — Ambulatory Visit (INDEPENDENT_AMBULATORY_CARE_PROVIDER_SITE_OTHER): Payer: Federal, State, Local not specified - PPO | Admitting: Family Medicine

## 2016-08-11 ENCOUNTER — Encounter: Payer: Self-pay | Admitting: Family Medicine

## 2016-08-11 VITALS — BP 124/68 | HR 115 | Temp 98.2°F | Resp 18 | Ht 63.0 in | Wt 224.3 lb

## 2016-08-11 DIAGNOSIS — E8881 Metabolic syndrome: Secondary | ICD-10-CM | POA: Diagnosis not present

## 2016-08-11 DIAGNOSIS — E88819 Insulin resistance, unspecified: Secondary | ICD-10-CM

## 2016-08-11 DIAGNOSIS — E785 Hyperlipidemia, unspecified: Secondary | ICD-10-CM

## 2016-08-11 DIAGNOSIS — R946 Abnormal results of thyroid function studies: Secondary | ICD-10-CM

## 2016-08-11 DIAGNOSIS — Z9884 Bariatric surgery status: Secondary | ICD-10-CM | POA: Diagnosis not present

## 2016-08-11 DIAGNOSIS — F5101 Primary insomnia: Secondary | ICD-10-CM | POA: Diagnosis not present

## 2016-08-11 DIAGNOSIS — M810 Age-related osteoporosis without current pathological fracture: Secondary | ICD-10-CM | POA: Diagnosis not present

## 2016-08-11 DIAGNOSIS — D692 Other nonthrombocytopenic purpura: Secondary | ICD-10-CM

## 2016-08-11 DIAGNOSIS — M545 Low back pain, unspecified: Secondary | ICD-10-CM

## 2016-08-11 DIAGNOSIS — R7989 Other specified abnormal findings of blood chemistry: Secondary | ICD-10-CM

## 2016-08-11 DIAGNOSIS — F331 Major depressive disorder, recurrent, moderate: Secondary | ICD-10-CM

## 2016-08-11 DIAGNOSIS — G8929 Other chronic pain: Secondary | ICD-10-CM

## 2016-08-11 DIAGNOSIS — E559 Vitamin D deficiency, unspecified: Secondary | ICD-10-CM

## 2016-08-11 DIAGNOSIS — E538 Deficiency of other specified B group vitamins: Secondary | ICD-10-CM | POA: Diagnosis not present

## 2016-08-11 MED ORDER — RALOXIFENE HCL 60 MG PO TABS: 60.0000 mg | ORAL_TABLET | Freq: Every day | ORAL | 4 refills | Status: DC

## 2016-08-11 MED ORDER — DESVENLAFAXINE SUCCINATE ER 50 MG PO TB24: 50.0000 mg | ORAL_TABLET | Freq: Every day | ORAL | 1 refills | Status: DC

## 2016-08-11 MED ORDER — LIRAGLUTIDE 18 MG/3ML ~~LOC~~ SOPN: 0.6000 mg | PEN_INJECTOR | Freq: Every day | SUBCUTANEOUS | 1 refills | Status: DC

## 2016-08-11 MED ORDER — INSULIN PEN NEEDLE 32G X 6 MM MISC: 1.0000 | Freq: Every day | 1 refills | Status: DC

## 2016-08-11 NOTE — Progress Notes (Signed)
Name: Terri Castillo   MRN: [id number]    DOB: 1951/01/10   Date:08/11/2016       Progress Note  Subjective  Chief Complaint  Chief Complaint  Patient presents with  . Pain    lower back  . Depression    3 month follow up  . Obesity    HPI  Depression ModerateRecurrent: she was   taking Cymbalta 42m but because cost and not working very well, she went back on Lexapro April 2018   [place] failed Prozac, Wellbutrin and Lexapro.She is still depressed, crys on daily basis, she has anhedonia, still misses hanging out with her sister ( she has been spending time carrying for her grandchildren), denies suicidal thoughts or ideation. She feels like she is almost 89and she feels like there is nothing forward to.  Vitamin D : slightly low, history of osteoporosis.  Osteoporosis: she took Forteo for 2 years around 2009, currently on Evista, bone density showed osteopenia, continue Evista   Blood in stools: she has noticed that she has to strain to have a bowel movement and has noticed blood when she wipes. Her last colonoscopy was normal in 2014. She had 3 negative hemoccult stools 10/2015. No recent episodes of constipation   Hyperlipidemia:Based on the results of lipid panel his cardiovascular risk factor ( using PTyronza) in the next 10 years is :4.3% and we will keep her off statin therapy for now.   GERD: off medication but she states she has been burping more lately, she stopped Voltaren given by back surgeon and is doing well  History of bariatric surgery: she had gastric bypass in 2010, her lowest weight was 180 lbs, but was gradually gained weight since. She has eliminated sugar and is feeling better, she has low 3 lbs again since last visit.   B12: continue supplementation   Hyperglycemia:denies polydipsia, polyuria but she has  Polyphagia - she states she has an addiction for food - she thinks about it all the time. Elevated fasting insulin, normal hgbA1C,  we will start GLP-1 agonist  Back pain: going on for a couple of year,  symptoms was  progressively worse, she saw Dr. SVertell Limber and had PT and is doing better.   Abnormal TSH: normal fractions, we will continue to monitor.   Patient Active Problem List   Diagnosis Date Noted  . Insulin resistance 08/11/2016  . Facet arthropathy, lumbar ([place] 02/02/2016  . Spondylisthesis 01/12/2016  . Chronic bilateral low back pain 11/01/2015  . Iron deficiency anemia 09/04/2014  . Hyperglycemia 09/04/2014  . Perioral dermatitis 09/04/2014  . 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 B12 deficiency 07/02/2014  . Cervical pain 07/02/2014  . Arthralgia of lower leg 07/02/2014  . Insomnia 07/02/2014  . Depression, major, recurrent, mild ([place] 07/02/2014  . Dyslipidemia 07/02/2014  . Dermatitis, eczematoid 07/02/2014  . Genital herpes in women 07/02/2014  . Gastric reflux 07/02/2014  . Bariatric surgery status 07/02/2014  . Herpes simplex 07/02/2014  . H/O: pneumonia 07/02/2014  . Eczema intertrigo 07/02/2014  . Adult BMI 30+ 07/02/2014  . OP (osteoporosis) 07/02/2014  . 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 Perennial allergic rhinitis with seasonal variation 07/02/2014  . Basal cell papilloma 07/02/2014  . Vitamin D deficiency 07/02/2014    Past Surgical History:  Procedure Laterality Date  . ABDOMINAL HYSTERECTOMY  1994  . CHOLECYSTECTOMY  2000  . EYE SURGERY  2000   Cataract Removal  . EYE SURGERY  2002   Cataract Removal  .  GASTRIC BYPASS  2000   Mini  . GASTRIC BYPASS  2010    Family History  Problem Relation Age of Onset  . Dementia Mother   . COPD Mother   . Osteoporosis Mother   . Diabetes Father   . Glaucoma Father   . Parkinson's disease Father   . Glaucoma Brother   . Heart disease Brother   . Heart Problems Brother   . Brain cancer Daughter        Brain Tumor  . Hypothyroidism Daughter     Social History   Social History  . Marital status: Divorced    Spouse name: N/A  .  Number of children: N/A  . Years of education: N/A   Occupational History  . Not on file.   Social History Main Topics  . Smoking status: Former Smoker    Packs/day: 0.50    Years: 5.00    Types: Cigarettes    Start date: 01/23/1985    Quit date: 01/23/1990  . Smokeless tobacco: Never Used  . Alcohol use No  . Drug use: No  . Sexual activity: Not Currently   Other Topics Concern  . Not on file   Social History Narrative  . No narrative on file     Current Outpatient Prescriptions:  .  acetaminophen (TYLENOL) 500 MG tablet, Take 1 tablet by mouth 4 (four) times daily as needed., Disp: , Rfl:  .  calcium-vitamin D (OSCAL WITH D) 500-200 MG-UNIT per tablet, Take 1 tablet by mouth 2 (two) times daily., Disp: 60 tablet, Rfl: 0 .  cetirizine (ZYRTEC) 10 MG tablet, Take 10 mg by mouth daily., Disp: , Rfl:  .  cyclobenzaprine (FLEXERIL) 10 MG tablet, Take 0.5-1 tablets (5-10 mg total) by mouth at bedtime., Disp: 90 tablet, Rfl: 1 .  fluticasone (FLONASE) 50 MCG/ACT nasal spray, Place 2 sprays into both nostrils at bedtime., Disp: 48 g, Rfl: 1 .  ketoconazole (NIZORAL) 2 % cream, APPLY TO RASH TWICE DAILY, Disp: 90 g, Rfl: 0 .  Pediatric Multivitamins-Iron (MULTIPLE VITAMINS-IRON PO), Take 1 tablet by mouth daily., Disp: , Rfl:  .  PREMARIN vaginal cream, Apply 1 application topically 3 (three) times a week., Disp: , Rfl: 12 .  raloxifene (EVISTA) 60 MG tablet, Take 1 tablet (60 mg total) by mouth daily., Disp: 90 tablet, Rfl: 4 .  triamcinolone cream (KENALOG) 0.5 %, Apply 1 application topically daily as needed., Disp: , Rfl:  .  valACYclovir (VALTREX) 500 MG tablet, Take 1 tablet (500 mg total) by mouth 3 (three) times daily., Disp: 30 tablet, Rfl: 0 .  Vitamin D, Ergocalciferol, (DRISDOL) 50000 units CAPS capsule, Take 1 capsule (50,000 Units total) by mouth every 7 (seven) days., Disp: 12 capsule, Rfl: 1 .  desvenlafaxine (PRISTIQ) 50 MG 24 hr tablet, Take 1 tablet (50 mg total) by  mouth daily., Disp: 30 tablet, Rfl: 1 .  liraglutide (VICTOZA) 18 MG/3ML SOPN, Inject 0.1-0.3 mLs (0.6-1.8 mg total) into the skin daily., Disp: 9 mL, Rfl: 1  Allergies  Allergen Reactions  . Topamax  [Topiramate]     severe depression  . Trazodone     Other reaction(s): Dizziness     ROS  Constitutional: Negative for fever or significant  weight change.  Respiratory: Negative for cough and shortness of breath.   Cardiovascular: Negative for chest pain or palpitations.  Gastrointestinal: Negative for abdominal pain, no bowel changes.  Musculoskeletal: Negative for gait problem or joint swelling.  Skin: Negative for rash. (  resolved)  Neurological: Negative for dizziness or headache.  No other specific complaints in a complete review of systems (except as listed in HPI above).  Objective  Vitals:   08/11/16 0912  BP: 124/68  Pulse: (!) 115  Resp: 18  Temp: 98.2 F (36.8 C)  TempSrc: Oral  SpO2: 94%  Weight: 224 lb 4.8 oz (101.7 kg)  Height: 5' 3" (1.6 m)    Body mass index is 39.73 kg/m.  Physical Exam  Constitutional: Patient appears well-developed and well-nourished. Obese  No distress.  HEENT: head atraumatic, normocephalic, pupils equal and reactive to light, neck supple, throat within normal limits Cardiovascular: Normal rate, regular rhythm and normal heart sounds.  No murmur heard. No BLE edema. Pulmonary/Chest: Effort normal and breath sounds normal. No respiratory distress. Abdominal: Soft.  There is no tenderness. Psychiatric: Patient has a normal mood and affect. behavior is normal. Judgment and thought content normal. Skin: senile purpura on left arm  Recent Results (from the past 2160 hour(s))  COMPLETE METABOLIC PANEL WITH GFR     Status: Abnormal   Collection Time: 06/28/16  8:08 AM  Result Value Ref Range   Sodium 139 135 - 146 mmol/L   Potassium 4.3 3.5 - 5.3 mmol/L   Chloride 106 98 - 110 mmol/L   CO2 22 20 - 31 mmol/L   Glucose, Bld 94 65 -  99 mg/dL   BUN 13 7 - 25 mg/dL   Creat 0.77 0.50 - 0.99 mg/dL    Comment:   For patients > or = 65 years of age: The upper reference limit for Creatinine is approximately 13% higher for people identified as African-American.      Total Bilirubin 0.4 0.2 - 1.2 mg/dL   Alkaline Phosphatase 107 33 - 130 U/L   AST 19 10 - 35 U/L   ALT 18 6 - 29 U/L   Total Protein 6.7 6.1 - 8.1 g/dL   Albumin 3.6 3.6 - 5.1 g/dL   Calcium 8.5 (L) 8.6 - 10.4 mg/dL   GFR, Est African American >89 >=60 mL/min   GFR, Est Non African American 82 >=60 mL/min  CBC with Differential/Platelet     Status: None   Collection Time: 06/28/16  8:08 AM  Result Value Ref Range   WBC 4.5 3.8 - 10.8 K/uL   RBC 4.66 3.80 - 5.10 MIL/uL   Hemoglobin 13.7 11.7 - 15.5 g/dL   HCT 42.2 35.0 - 45.0 %   MCV 90.6 80.0 - 100.0 fL   MCH 29.4 27.0 - 33.0 pg   MCHC 32.5 32.0 - 36.0 g/dL   RDW 14.3 11.0 - 15.0 %   Platelets 337 140 - 400 K/uL   MPV 9.5 7.5 - 12.5 fL   Neutro Abs 2,565 1,500 - 7,800 cells/uL   Lymphs Abs 1,260 850 - 3,900 cells/uL   Monocytes Absolute 450 200 - 950 cells/uL   Eosinophils Absolute 180 15 - 500 cells/uL   Basophils Absolute 45 0 - 200 cells/uL   Neutrophils Relative % 57 %   Lymphocytes Relative 28 %   Monocytes Relative 10 %   Eosinophils Relative 4 %   Basophils Relative 1 %   Smear Review Criteria for review not met   Lipid panel     Status: Abnormal   Collection Time: 06/28/16  8:08 AM  Result Value Ref Range   Cholesterol 160 <200 mg/dL   Triglycerides 147 <150 mg/dL   HDL 42 (L) >50 mg/dL   Total  CHOL/HDL Ratio 3.8 <5.0 Ratio   VLDL 29 <30 mg/dL   LDL Cholesterol 89 <100 mg/dL  TSH     Status: Abnormal   Collection Time: 06/28/16  8:08 AM  Result Value Ref Range   TSH 5.25 (H) mIU/L    Comment:   Reference Range   > or = 20 Years  0.40-4.50   Pregnancy Range First trimester  0.26-2.66 Second trimester 0.55-2.73 Third trimester  0.43-2.91     Iron, TIBC and Ferritin  Panel     Status: Abnormal   Collection Time: 06/28/16  8:08 AM  Result Value Ref Range   Ferritin 13 (L) 20 - 288 ng/mL   Iron 83 45 - 160 ug/dL   TIBC 414 250 - 450 ug/dL   %SAT 20 11 - 50 %  Vitamin B12     Status: None   Collection Time: 06/28/16  8:08 AM  Result Value Ref Range   Vitamin B-12 322 200 - 1,100 pg/mL  Hemoglobin A1c     Status: None   Collection Time: 06/28/16  8:08 AM  Result Value Ref Range   Hgb A1c MFr Bld 5.3 <5.7 %    Comment:   For the purpose of screening for the presence of diabetes:   <5.7%       Consistent with the absence of diabetes 5.7-6.4 %   Consistent with increased risk for diabetes (prediabetes) >=6.5 %     Consistent with diabetes   This assay result is consistent with a decreased risk of diabetes.   Currently, no consensus exists regarding use of hemoglobin A1c for diagnosis of diabetes in children.   According to American Diabetes Association (ADA) guidelines, hemoglobin A1c <7.0% represents optimal control in non-pregnant diabetic patients. Different metrics may apply to specific patient populations. Standards of Medical Care in Diabetes (ADA).      Mean Plasma Glucose 105 mg/dL  Insulin, fasting     Status: None   Collection Time: 06/28/16  8:08 AM  Result Value Ref Range   Insulin fasting, serum 9.7 2.0 - 19.6 uIU/mL    Comment:   This insulin assay shows strong cross-reactivity for some insulin analogs (lispro, aspart, and glargine) and much lower cross-reactivity with others (detemir, glulisine).   Stimulated Insulin reference intervals were established using the Siemens Immulite assay. These values are provided for general guidance only.   VITAMIN D 25 Hydroxy (Vit-D Deficiency, Fractures)     Status: Abnormal   Collection Time: 06/28/16  8:08 AM  Result Value Ref Range   Vit D, 25-Hydroxy 28 (L) 30 - 100 ng/mL    Comment: Vitamin D Status           25-OH Vitamin D        Deficiency                <20 ng/mL         Insufficiency         20 - 29 ng/mL        Optimal             > or = 30 ng/mL   For 25-OH Vitamin D testing on patients on D2-supplementation and patients for whom quantitation of D2 and D3 fractions is required, the QuestAssureD 25-OH VIT D, (D2,D3), LC/MS/MS is recommended: order code 754 [id number] (patients > 2 yrs).   Thyroid Panel With TSH     Status: Abnormal   Collection Time: 07/31/16 12:29 PM  Result Value Ref Range  T4, Total 7.2 4.5 - 12.0 ug/dL   T3 Uptake 26 22 - 35 %   Free Thyroxine Index 1.9 1.4 - 3.8   TSH 5.21 (H) mIU/L    Comment:   Reference Range   > or = 20 Years  0.40-4.50   Pregnancy Range First trimester  0.26-2.66 Second trimester 0.55-2.73 Third trimester  0.43-2.91         PHQ2/9: Depression screen [facility] 2/9 08/11/2016 02/14/2016 11/01/2015 07/06/2015 03/23/2015  Decreased Interest 0 1 3 0 0  Down, Depressed, Hopeless 0 1 1 0 0  PHQ - 2 Score 0 2 4 0 0  Altered sleeping - 1 3 - -  Tired, decreased energy - 1 3 - -  Change in appetite - 0 3 - -  Feeling bad or failure about yourself  - 0 3 - -  Trouble concentrating - 0 0 - -  Moving slowly or fidgety/restless - 0 0 - -  Suicidal thoughts - 0 0 - -  PHQ-9 Score - 4 16 - -  Difficult doing work/chores - Somewhat difficult Not difficult at all - -     Fall Risk: Fall Risk  08/11/2016 02/14/2016 11/01/2015 07/06/2015 03/23/2015  Falls in the past year? Yes No No No Yes  Number falls in past yr: 1 - - - 1  Injury with Fall? No - - - -    Functional Status Survey: Is the patient deaf or have difficulty hearing?: No Does the patient have difficulty seeing, even when wearing glasses/contacts?: No Does the patient have difficulty concentrating, remembering, or making decisions?: No Does the patient have difficulty walking or climbing stairs?: No Does the patient have difficulty dressing or bathing?: No Does the patient have difficulty doing errands alone such as visiting a doctor's office or shopping?:  No    Assessment & Plan  1. Depression, major, recurrent, moderate ([place])  Even though normal PHQ9 she states she is always crying  - desvenlafaxine (PRISTIQ) 50 MG 24 hr tablet; Take 1 tablet (50 mg total) by mouth daily.  Dispense: 30 tablet; Refill: 1  2. Dyslipidemia  Lipid panel has improved  3. B12 deficiency  Continue supplementation  4. Vitamin D deficiency  Continue supplementation   5. Primary insomnia  Stable   6. Insulin resistance  - liraglutide (VICTOZA) 18 MG/3ML SOPN; Inject 0.1-0.3 mLs (0.6-1.8 mg total) into the skin daily.  Dispense: 9 mL; Refill: 1  7. Bariatric surgery status  Trying to eat healthy, but still makes bad choices, willing to try GLP-1 agonist, discussed possible side effects, denies personal history of pancreatitis or family history of MEN or thyroid cancer  8. Chronic bilateral low back pain without sciatica  Doing better since PT  9. Osteoporosis without current pathological fracture, unspecified osteoporosis type  - raloxifene (EVISTA) 60 MG tablet; Take 1 tablet (60 mg total) by mouth daily.  Dispense: 90 tablet; Refill: 4  10. Senile purpura ([place])  Reassurance  11. Abnormal TSH  - Thyroid Panel With TSH

## 2016-08-11 NOTE — Patient Instructions (Signed)
Read about bing eating disorder

## 2016-08-11 NOTE — Addendum Note (Signed)
Addended by: Cynda FamiliaJOHNSON, Atisha Hamidi L on: 08/11/2016 10:10 AM   Modules accepted: Orders

## 2016-08-14 ENCOUNTER — Encounter: Payer: Self-pay | Admitting: Family Medicine

## 2016-08-17 ENCOUNTER — Encounter: Payer: Self-pay | Admitting: Family Medicine

## 2016-08-18 ENCOUNTER — Other Ambulatory Visit: Payer: Self-pay | Admitting: Family Medicine

## 2016-08-18 MED ORDER — FLUOXETINE HCL 20 MG PO TABS: 20.0000 mg | ORAL_TABLET | Freq: Every day | ORAL | 1 refills | Status: DC

## 2016-08-18 MED ORDER — DESVENLAFAXINE SUCCINATE ER 50 MG PO TB24: 50.0000 mg | ORAL_TABLET | Freq: Every day | ORAL | 1 refills | Status: DC

## 2016-09-20 ENCOUNTER — Ambulatory Visit: Payer: Federal, State, Local not specified - PPO | Admitting: Family Medicine

## 2016-10-03 ENCOUNTER — Encounter: Payer: Self-pay | Admitting: Family Medicine

## 2016-10-04 ENCOUNTER — Other Ambulatory Visit: Payer: Self-pay | Admitting: Family Medicine

## 2016-10-04 MED ORDER — DESVENLAFAXINE SUCCINATE ER 50 MG PO TB24: 50.0000 mg | ORAL_TABLET | Freq: Every day | ORAL | 0 refills | Status: DC

## 2016-10-13 ENCOUNTER — Other Ambulatory Visit: Payer: Self-pay

## 2016-10-13 NOTE — Telephone Encounter (Signed)
Patient requesting refill of Fluoxetine to [facility] for 90 days.

## 2016-10-17 ENCOUNTER — Other Ambulatory Visit: Payer: Self-pay | Admitting: Family Medicine

## 2016-10-25 ENCOUNTER — Other Ambulatory Visit: Payer: Self-pay

## 2016-10-25 MED ORDER — FLUOXETINE HCL 20 MG PO TABS: 20.0000 mg | ORAL_TABLET | Freq: Every day | ORAL | 0 refills | Status: DC

## 2016-10-25 NOTE — Telephone Encounter (Signed)
Needs follow up, sending a refill

## 2016-10-25 NOTE — Telephone Encounter (Signed)
Patient requesting refill of Fluoxetine 20 mg to [facility] with a 90 day supply.

## 2016-11-09 ENCOUNTER — Encounter: Payer: Self-pay | Admitting: Family Medicine

## 2016-11-09 ENCOUNTER — Ambulatory Visit (INDEPENDENT_AMBULATORY_CARE_PROVIDER_SITE_OTHER): Payer: Federal, State, Local not specified - PPO | Admitting: Family Medicine

## 2016-11-09 VITALS — BP 122/74 | HR 100 | Temp 98.2°F | Resp 18 | Ht 63.0 in | Wt 226.6 lb

## 2016-11-09 DIAGNOSIS — D692 Other nonthrombocytopenic purpura: Secondary | ICD-10-CM | POA: Diagnosis not present

## 2016-11-09 DIAGNOSIS — Z23 Encounter for immunization: Secondary | ICD-10-CM

## 2016-11-09 DIAGNOSIS — E785 Hyperlipidemia, unspecified: Secondary | ICD-10-CM

## 2016-11-09 DIAGNOSIS — F5081 Binge eating disorder: Secondary | ICD-10-CM

## 2016-11-09 DIAGNOSIS — D509 Iron deficiency anemia, unspecified: Secondary | ICD-10-CM | POA: Diagnosis not present

## 2016-11-09 DIAGNOSIS — R7989 Other specified abnormal findings of blood chemistry: Secondary | ICD-10-CM | POA: Diagnosis not present

## 2016-11-09 DIAGNOSIS — F5101 Primary insomnia: Secondary | ICD-10-CM

## 2016-11-09 DIAGNOSIS — E8881 Metabolic syndrome: Secondary | ICD-10-CM

## 2016-11-09 DIAGNOSIS — Z9884 Bariatric surgery status: Secondary | ICD-10-CM | POA: Diagnosis not present

## 2016-11-09 DIAGNOSIS — M62830 Muscle spasm of back: Secondary | ICD-10-CM

## 2016-11-09 DIAGNOSIS — M810 Age-related osteoporosis without current pathological fracture: Secondary | ICD-10-CM

## 2016-11-09 DIAGNOSIS — F331 Major depressive disorder, recurrent, moderate: Secondary | ICD-10-CM

## 2016-11-09 MED ORDER — FERROUS SULFATE 325 (65 FE) MG PO TABS: 325.0000 mg | ORAL_TABLET | Freq: Two times a day (BID) | ORAL | 2 refills | Status: DC

## 2016-11-09 MED ORDER — LISDEXAMFETAMINE DIMESYLATE 30 MG PO CAPS: 30.0000 mg | ORAL_CAPSULE | Freq: Every day | ORAL | 0 refills | Status: DC

## 2016-11-09 MED ORDER — HYDROXYZINE HCL 10 MG PO TABS: 10.0000 mg | ORAL_TABLET | Freq: Every evening | ORAL | 2 refills | Status: DC

## 2016-11-09 MED ORDER — FLUOXETINE HCL 20 MG PO TABS: 20.0000 mg | ORAL_TABLET | Freq: Every day | ORAL | 2 refills | Status: DC

## 2016-11-09 MED ORDER — CYCLOBENZAPRINE HCL 10 MG PO TABS: 5.0000 mg | ORAL_TABLET | Freq: Every day | ORAL | 0 refills | Status: DC

## 2016-11-09 NOTE — Progress Notes (Signed)
Name: Terri Castillo   MRN: [id number]    DOB: [birth year]   Date:11/09/2016       Progress Note  Subjective  Chief Complaint  Chief Complaint  Patient presents with  . Medication Refill    3 month F/U  . Depression    Patient cries periodically for no reason without the Alprazolam.   . Hyperlipidemia  . Osteoporosis  . Gastroesophageal Reflux    Well controlled  . Back Pain    Unchanged    HPI  Depression ModerateRecurrent: she was   taking Cymbalta 90mg  but because cost and not working very well, she went back on Lexapro April 2018   Terri Castillo has failed Prozac, Wellbutrin and Lexapro and currently on Pritiq but not any better, however cost is high, we will go back to Prozac as she requests. .She is still depressed, crys on daily basis ( but states does not feel sad), she has anhedonia, she also loses temper with her grandson,  still misses hanging out with her sister ( she has been spending time carrying for her grandchildren), denies suicidal thoughts or ideation. She asked for refills of Alprazolam, but we will try atarax instead   Vitamin D : slightly low, history of osteoporosis.  Osteoporosis: she took Forteo for 2 years around 2009, currently on Evista, bone density showed osteopenia, continue Evista   Hyperlipidemia:Based on the results of lipid panel his cardiovascular risk factor ( using Po[place] Cohort ) in the next 10 years is :4.3% and we will keep her off statin therapy for now.   GERD: off medication but she states she has been burping more lately, she stopped Voltaren given by back surgeon and is doing well  History of bariatric surgery: she had gastric bypass in 2010, her lowest weight was 180 lbs, but was gradually gained weight since. Last labs showed anemia, iron deficiency, needs to get 3 more hemoccult cards, and take ferrous sulfate daily   B12: continue supplementation   Hyperglycemia:denies polydipsia, polyuria but she has Polyphagia - she  states she has an addiction for food - she thinks about it all the time. Elevated fasting insulin, normal hgbA1C, we gave Victoza and it helped with symptoms but it was too expensive. We will try vyvanse, she has symptoms of binge eating disorder.   Back pain: going on for a couple of year,  symptoms was  progressively worse, she saw Dr. Venetia Maxon, and had PT and is doing better.   Abnormal TSH: normal fractions, we will continue to monitor. We will check thyroglobulins.   Patient Active Problem List   Diagnosis Date Noted  . Insulin resistance 08/11/2016  . Senile purpura (HCC) 08/11/2016  . Facet arthropathy, lumbar 02/02/2016  . Spondylisthesis 01/12/2016  . Chronic bilateral low back pain 11/01/2015  . Iron deficiency anemia 09/04/2014  . Hyperglycemia 09/04/2014  . Perioral dermatitis 09/04/2014  . 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 B12 deficiency 07/02/2014  . Cervical pain 07/02/2014  . Arthralgia of lower leg 07/02/2014  . Insomnia 07/02/2014  . 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 Dyslipidemia 07/02/2014  . Dermatitis, eczematoid 07/02/2014  . Genital herpes in women 07/02/2014  . Gastric reflux 07/02/2014  . Bariatric surgery status 07/02/2014  . Herpes simplex 07/02/2014  . H/O: pneumonia 07/02/2014  . Eczema intertrigo 07/02/2014  . Adult BMI 30+ 07/02/2014  . OP (osteoporosis) 07/02/2014  . 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 Perennial allergic rhinitis with seasonal variation 07/02/2014  . Basal cell papilloma 07/02/2014  .  Vitamin D deficiency 07/02/2014    Past Surgical History:  Procedure Laterality Date  . ABDOMINAL HYSTERECTOMY  1994  . CHOLECYSTECTOMY  2000  . EYE SURGERY  2000   Cataract Removal  . EYE SURGERY  2002   Cataract Removal  . GASTRIC BYPASS  2000   Mini  . GASTRIC BYPASS  2010    Family History  Problem Relation Age of Onset  . Dementia Mother   . COPD Mother   . Osteoporosis Mother   . Diabetes Father   . Glaucoma Father    . Parkinson's disease Father   . Glaucoma Brother   . Heart disease Brother   . Heart Problems Brother   . Brain cancer Daughter        Brain Tumor  . Hypothyroidism Daughter     Social History   Social History  . Marital status: Divorced    Spouse name: N/A  . Number of children: N/A  . Years of education: N/A   Occupational History  . Not on file.   Social History Main Topics  . Smoking status: Former Smoker    Packs/day: 0.50    Years: 5.00    Types: Cigarettes    Start date: 01/23/1985    Quit date: 01/23/1990  . Smokeless tobacco: Never Used  . Alcohol use No  . Drug use: No  . Sexual activity: Not Currently   Other Topics Concern  . Not on file   Social History Narrative  . No narrative on file     Current Outpatient Prescriptions:  .  acetaminophen (TYLENOL) 500 MG tablet, Take 1 tablet by mouth 4 (four) times daily as needed., Disp: , Rfl:  .  cetirizine (ZYRTEC) 10 MG tablet, Take 10 mg by mouth daily., Disp: , Rfl:  .  desvenlafaxine (PRISTIQ) 50 MG 24 hr tablet, Take 1 tablet (50 mg total) by mouth daily., Disp: 30 tablet, Rfl: 0 .  fluticasone (FLONASE) 50 MCG/ACT nasal spray, Place 2 sprays into both nostrils at bedtime., Disp: 48 g, Rfl: 1 .  ketoconazole (NIZORAL) 2 % cream, APPLY TO RASH TWICE DAILY, Disp: 90 g, Rfl: 0 .  PREMARIN vaginal cream, Apply 1 application topically 3 (three) times a week., Disp: , Rfl: 12 .  raloxifene (EVISTA) 60 MG tablet, Take 1 tablet (60 mg total) by mouth daily., Disp: 90 tablet, Rfl: 4 .  triamcinolone cream (KENALOG) 0.5 %, Apply 1 application topically daily as needed., Disp: , Rfl:  .  valACYclovir (VALTREX) 500 MG tablet, Take 1 tablet (500 mg total) by mouth 3 (three) times daily., Disp: 30 tablet, Rfl: 0 .  Vitamin D, Ergocalciferol, (DRISDOL) 50000 units CAPS capsule, Take 1 capsule (50,000 Units total) by mouth every 7 (seven) days., Disp: 12 capsule, Rfl: 1 .  calcium-vitamin D (OSCAL WITH D) 500-200 MG-UNIT  per tablet, Take 1 tablet by mouth 2 (two) times daily. (Patient not taking: Reported on 11/09/2016), Disp: 60 tablet, Rfl: 0 .  cyclobenzaprine (FLEXERIL) 10 MG tablet, Take 0.5-1 tablets (5-10 mg total) by mouth at bedtime., Disp: 90 tablet, Rfl: 0 .  ferrous sulfate 325 (65 FE) MG tablet, Take 1 tablet (325 mg total) by mouth 2 (two) times daily., Disp: 60 tablet, Rfl: 2 .  FLUoxetine (PROZAC) 20 MG tablet, Take 1 tablet (20 mg total) by mouth daily., Disp: 30 tablet, Rfl: 2 .  hydrOXYzine (ATARAX/VISTARIL) 10 MG tablet, Take 1 tablet (10 mg total) by mouth every evening. For anxiety, Disp: 30  tablet, Rfl: 2  Allergies  Allergen Reactions  . Topamax  [Topiramate]     severe depression  . Trazodone     Other reaction(s): Dizziness     ROS  Constitutional: Negative for fever or weight change.  Respiratory: Negative for cough and shortness of breath.   Cardiovascular: Negative for chest pain or palpitations.  Gastrointestinal: Negative for abdominal pain, no bowel changes.  Musculoskeletal: Negative for gait problem or joint swelling.  Skin: Negative for rash.  Neurological: Negative for dizziness or headache.  No other specific complaints in a complete review of systems (except as listed in HPI above).  Objective  Vitals:   11/09/16 0927  BP: 122/74  Pulse: 100  Resp: 18  Temp: 98.2 F (36.8 C)  TempSrc: Oral  SpO2: 96%  Weight: 226 lb 9.6 oz (102.8 kg)  Height: 5\' 3"  (1.6 m)    Body mass index is 40.14 kg/m.  Physical Exam  Constitutional: Patient appears well-developed and well-nourished. Obese  No distress.  HEENT: head atraumatic, normocephalic, pupils equal and reactive to light,  neck supple, throat within normal limits Cardiovascular: Normal rate, regular rhythm and normal heart sounds.  No murmur heard. No BLE edema. Pulmonary/Chest: Effort normal and breath sounds normal. No respiratory distress. Abdominal: Soft.  There is no tenderness. Psychiatric:  Patient has a normal mood and affect. behavior is normal. Judgment and thought content normal.  PHQ2/9: Depression screen [facility] 2/9 11/09/2016 08/11/2016 02/14/2016 11/01/2015 07/06/2015  Decreased Interest 0 0 1 3 0  Down, Depressed, Hopeless 0 0 1 1 0  PHQ - 2 Score 0 0 2 4 0  Altered sleeping - - 1 3 -  Tired, decreased energy - - 1 3 -  Change in appetite - - 0 3 -  Feeling bad or failure about yourself  - - 0 3 -  Trouble concentrating - - 0 0 -  Moving slowly or fidgety/restless - - 0 0 -  Suicidal thoughts - - 0 0 -  PHQ-9 Score - - 4 16 -  Difficult doing work/chores - - Somewhat difficult Not difficult at all -     Fall Risk: Fall Risk  11/09/2016 08/11/2016 02/14/2016 11/01/2015 07/06/2015  Falls in the past year? No Yes No No No  Number falls in past yr: - 1 - - -  Injury with Fall? - No - - -     Functional Status Survey: Is the patient deaf or have difficulty hearing?: No Does the patient have difficulty seeing, even when wearing glasses/contacts?: No Does the patient have difficulty concentrating, remembering, or making decisions?: No Does the patient have difficulty walking or climbing stairs?: No Does the patient have difficulty dressing or bathing?: No Does the patient have difficulty doing errands alone such as visiting a doctor's office or shopping?: No    Assessment & Plan  1. Depression, major, recurrent, moderate (HCC)  Pritiq did not work well, and is expensive so we will go back to Prozac - FLUoxetine (PROZAC) 20 MG tablet; Take 1 tablet (20 mg total) by mouth daily.  Dispense: 30 tablet; Refill: 2 - hydrOXYzine (ATARAX/VISTARIL) 10 MG tablet; Take 1 tablet (10 mg total) by mouth every evening. For anxiety  Dispense: 30 tablet; Refill: 2  2. Spasm of muscle, back  - cyclobenzaprine (FLEXERIL) 10 MG tablet; Take 0.5-1 tablets (5-10 mg total) by mouth at bedtime.  Dispense: 90 tablet; Refill: 0  3. Insulin resistance  Off Victoza because of cost  4.  Need for immunization against influenza  - Flu Vaccine QUAD 6+ mos PF IM (Fluarix Quad PF)  5. Dyslipidemia  On life style modification   6. Bariatric surgery status  It may be the cause of iron deficiency   7. Primary insomnia  Off alprazolam, we will try hydroxizine  8. Osteoporosis without current pathological fracture, unspecified osteoporosis type  On Evista  9. Senile purpura (HCC)  stable  10. Iron deficiency anemia, unspecified iron deficiency anemia type  - POC Hemoccult Bld/Stl (3-Cd Home Screen); Future - ferrous sulfate 325 (65 FE) MG tablet; Take 1 tablet (325 mg total) by mouth 2 (two) times daily.  Dispense: 60 tablet; Refill: 2  11. Abnormal TSH  - Thyroid Panel With TSH - Thyroglobulin antibody - Thyroid peroxidase antibody - Thyroglobulin Level   12. Binge eating disorder  - lisdexamfetamine (VYVANSE) 30 MG capsule; Take 1 capsule (30 mg total) by mouth daily.  Dispense: 30 capsule; Refill: 0

## 2016-11-10 ENCOUNTER — Other Ambulatory Visit: Payer: Self-pay

## 2016-11-10 ENCOUNTER — Other Ambulatory Visit: Payer: Self-pay | Admitting: Family Medicine

## 2016-11-10 DIAGNOSIS — Z1231 Encounter for screening mammogram for malignant neoplasm of breast: Secondary | ICD-10-CM

## 2016-11-10 LAB — THYROGLOBULIN ANTIBODY: Thyroglobulin Ab: <1 IU/mL (ref <=1)

## 2016-11-10 LAB — THYROID PANEL WITH TSH
Free Thyroxine Index: 2.2 (ref 1.4–3.8)
T3 UPTAKE: 23 % (ref 22–35)
T4, Total: 9.5 ug/dL (ref 5.1–11.9)
TSH: 3.95 mIU/L (ref 0.40–4.50)

## 2016-11-10 LAB — THYROGLOBULIN LEVEL: Thyroglobulin: 32.9 ng/mL

## 2016-11-10 LAB — THYROID PEROXIDASE ANTIBODY

## 2016-11-10 NOTE — Telephone Encounter (Signed)
Refill request for general medication of desvenlafaxine to [facility]. With a 90 day supply.   Last office visit: 11/09/2016.  Next visit is 12/11/2016.

## 2016-11-21 ENCOUNTER — Other Ambulatory Visit: Payer: Self-pay

## 2016-11-21 DIAGNOSIS — E559 Vitamin D deficiency, unspecified: Secondary | ICD-10-CM

## 2016-11-21 NOTE — Telephone Encounter (Signed)
Patient requesting refill of Vitamin D to [facility] for a 90 day supply.

## 2016-11-23 MED ORDER — VITAMIN D (ERGOCALCIFEROL) 1.25 MG (50000 UNIT) PO CAPS: 50000.0000 [IU] | ORAL_CAPSULE | ORAL | 1 refills | Status: DC

## 2016-11-29 ENCOUNTER — Ambulatory Visit
Admission: RE | Admit: 2016-11-29 | Discharge: 2016-11-29 | Disposition: A | Discharge status: Home / Self Care | Payer: Federal, State, Local not specified - PPO | Source: Ambulatory Visit | Attending: Family Medicine | Admitting: Family Medicine

## 2016-11-29 DIAGNOSIS — Z1231 Encounter for screening mammogram for malignant neoplasm of breast: Secondary | ICD-10-CM

## 2016-12-11 ENCOUNTER — Ambulatory Visit: Payer: Federal, State, Local not specified - PPO | Admitting: Family Medicine

## 2016-12-11 ENCOUNTER — Encounter: Payer: Self-pay | Admitting: Family Medicine

## 2016-12-11 VITALS — BP 130/78 | HR 77 | Resp 12 | Ht 63.0 in | Wt 226.3 lb

## 2016-12-11 DIAGNOSIS — D509 Iron deficiency anemia, unspecified: Secondary | ICD-10-CM | POA: Diagnosis not present

## 2016-12-11 DIAGNOSIS — F5081 Binge eating disorder: Secondary | ICD-10-CM

## 2016-12-11 DIAGNOSIS — F331 Major depressive disorder, recurrent, moderate: Secondary | ICD-10-CM

## 2016-12-11 DIAGNOSIS — Z6841 Body Mass Index (BMI) 40.0 and over, adult: Secondary | ICD-10-CM | POA: Diagnosis not present

## 2016-12-11 MED ORDER — LISDEXAMFETAMINE DIMESYLATE 40 MG PO CAPS: 40.0000 mg | ORAL_CAPSULE | Freq: Every day | ORAL | 0 refills | Status: DC

## 2016-12-11 NOTE — Progress Notes (Signed)
Name: Terri Castillo   MRN: [id number]    DOB: August 17, 1950   Date:12/11/2016       Progress Note  Subjective  Chief Complaint  Chief Complaint  Patient presents with  . Depression  . Anxiety  . Obesity    HPI  Depression ModerateRecurrent: she was taking Cymbalta 90mg  but because cost and not working very well, she went back on Lexapro April 2018 Terri Castillo has failed Prozac, Wellbutrin and Lexapro and currently on Pritiq but not any better, however cost is high, we went back to Prozac 10/2016 and she states it seems to be working, she is also on Vyvanse that we started because of binge eating disorder - she states it gave her energy. She is states no recent crying spells, more motivated - doing yard work, able to clear her house. Feeling better even though PHQ9 still positive   History of bariatric surgery: she had gastric bypass in 2010, her lowest weight was 180 lbs, but wasgradually gained weight since. Last labs showed anemia, iron deficiency, needs to get 3 more hemoccult cards,( unfortunately we did not hand cards to her last time - but gave it to her today)  take ferrous sulfate daily   Bing eating disorder:  Started on Vyvanse 10/2016 , it has curbed her appetite but has not lost weight, we will try a higher dose  Patient Active Problem List   Diagnosis Date Noted  . Insulin resistance 08/11/2016  . Senile purpura (HCC) 08/11/2016  . Facet arthropathy, lumbar 02/02/2016  . Spondylisthesis 01/12/2016  . Chronic bilateral low back pain 11/01/2015  . Iron deficiency anemia 09/04/2014  . Hyperglycemia 09/04/2014  . Perioral dermatitis 09/04/2014  . 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 B12 deficiency 07/02/2014  . Cervical pain 07/02/2014  . Arthralgia of lower leg 07/02/2014  . Insomnia 07/02/2014  . 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 Dyslipidemia 07/02/2014  . Dermatitis, eczematoid 07/02/2014  . Genital herpes in women 07/02/2014  . Gastric reflux  07/02/2014  . Bariatric surgery status 07/02/2014  . Herpes simplex 07/02/2014  . H/O: pneumonia 07/02/2014  . Eczema intertrigo 07/02/2014  . Adult BMI 30+ 07/02/2014  . OP (osteoporosis) 07/02/2014  . 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 Perennial allergic rhinitis with seasonal variation 07/02/2014  . Basal cell papilloma 07/02/2014  . Vitamin D deficiency 07/02/2014    Past Surgical History:  Procedure Laterality Date  . ABDOMINAL HYSTERECTOMY  1994  . CHOLECYSTECTOMY  2000  . EYE SURGERY  2000   Cataract Removal  . EYE SURGERY  2002   Cataract Removal  . GASTRIC BYPASS  2000   Mini  . GASTRIC BYPASS  2010    Family History  Problem Relation Age of Onset  . Dementia Mother   . COPD Mother   . Osteoporosis Mother   . Diabetes Father   . Glaucoma Father   . Parkinson's disease Father   . Glaucoma Brother   . Heart disease Brother   . Heart Problems Brother   . Brain cancer Daughter        Brain Tumor  . Hypothyroidism Daughter     Social History   Socioeconomic History  . Marital status: Divorced    Spouse name: Not on file  . Number of children: Not on file  . Years of education: Not on file  . Highest education level: Not on file  Social Needs  . Financial resource strain: Not on file  . Food insecurity - worry: Not  on file  . Food insecurity - inability: Not on file  . Transportation needs - medical: Not on file  . Transportation needs - non-medical: Not on file  Occupational History  . Not on file  Tobacco Use  . Smoking status: Former Smoker    Packs/day: 0.50    Years: 5.00    Pack years: 2.50    Types: Cigarettes    Start date: 01/23/1985    Last attempt to quit: 01/23/1990    Years since quitting: 26.9  . Smokeless tobacco: Never Used  Substance and Sexual Activity  . Alcohol use: No    Alcohol/week: 0.0 oz  . Drug use: No  . Sexual activity: Not Currently  Other Topics Concern  . Not on file  Social History Narrative  . Not on file      Current Outpatient Medications:  .  acetaminophen (TYLENOL) 500 MG tablet, Take 1 tablet by mouth 4 (four) times daily as needed., Disp: , Rfl:  .  calcium-vitamin D (OSCAL WITH D) 500-200 MG-UNIT per tablet, Take 1 tablet by mouth 2 (two) times daily., Disp: 60 tablet, Rfl: 0 .  cetirizine (ZYRTEC) 10 MG tablet, Take 10 mg by mouth daily., Disp: , Rfl:  .  cyclobenzaprine (FLEXERIL) 10 MG tablet, Take 0.5-1 tablets (5-10 mg total) by mouth at bedtime., Disp: 90 tablet, Rfl: 0 .  ferrous sulfate 325 (65 FE) MG tablet, Take 1 tablet (325 mg total) by mouth 2 (two) times daily., Disp: 60 tablet, Rfl: 2 .  FLUoxetine (PROZAC) 20 MG tablet, Take 1 tablet (20 mg total) by mouth daily., Disp: 30 tablet, Rfl: 2 .  fluticasone (FLONASE) 50 MCG/ACT nasal spray, Place 2 sprays into both nostrils at bedtime., Disp: 48 g, Rfl: 1 .  hydrOXYzine (ATARAX/VISTARIL) 10 MG tablet, Take 1 tablet (10 mg total) by mouth every evening. For anxiety, Disp: 30 tablet, Rfl: 2 .  ketoconazole (NIZORAL) 2 % cream, APPLY TO RASH TWICE DAILY, Disp: 90 g, Rfl: 0 .  lisdexamfetamine (VYVANSE) 40 MG capsule, Take 1 capsule (40 mg total) daily by mouth., Disp: 30 capsule, Rfl: 0 .  PREMARIN vaginal cream, Apply 1 application topically 3 (three) times a week., Disp: , Rfl: 12 .  raloxifene (EVISTA) 60 MG tablet, Take 1 tablet (60 mg total) by mouth daily., Disp: 90 tablet, Rfl: 4 .  triamcinolone cream (KENALOG) 0.5 %, Apply 1 application topically daily as needed., Disp: , Rfl:  .  valACYclovir (VALTREX) 500 MG tablet, Take 1 tablet (500 mg total) by mouth 3 (three) times daily., Disp: 30 tablet, Rfl: 0 .  Vitamin D, Ergocalciferol, (DRISDOL) 50000 units CAPS capsule, Take 1 capsule (50,000 Units total) by mouth every 7 (seven) days., Disp: 12 capsule, Rfl: 1  Allergies  Allergen Reactions  . Topamax  [Topiramate]     severe depression  . Trazodone     Other reaction(s): Dizziness     ROS  Ten systems reviewed and  is negative except as mentioned in HPI   Objective  Vitals:   12/11/16 1148  BP: 130/78  Pulse: 77  Resp: 12  SpO2: 97%  Weight: 226 lb 4.8 oz (102.6 kg)  Height: 5\' 3"  (1.6 m)    Body mass index is 40.09 kg/m.  Physical Exam  Constitutional: Patient appears well-developed and well-nourished. Obese No distress.  HEENT: head atraumatic, normocephalic, pupils equal and reactive to light, neck supple, throat within normal limits Cardiovascular: Normal rate, regular rhythm and normal heart sounds.  No murmur heard. No BLE edema. Pulmonary/Chest: Effort normal and breath sounds normal. No respiratory distress. Abdominal: Soft.  There is no tenderness. Psychiatric: Patient has a normal mood and affect. behavior is normal. Judgment and thought content normal.  Recent Results (from the past 2160 hour(s))  Thyroid Panel With TSH     Status: None   Collection Time: 11/09/16 11:00 AM  Result Value Ref Range   T3 Uptake 23 22 - 35 %   T4, Total 9.5 5.1 - 11.9 mcg/dL   Free Thyroxine Index 2.2 1.4 - 3.8   TSH 3.95 0.40 - 4.50 mIU/L  Thyroglobulin antibody     Status: None   Collection Time: 11/09/16 11:00 AM  Result Value Ref Range   Thyroglobulin Ab <1 < or = 1 IU/mL  Thyroid peroxidase antibody     Status: None   Collection Time: 11/09/16 11:00 AM  Result Value Ref Range   Thyroperoxidase Ab SerPl-aCnc <1 <9 IU/mL  Thyroglobulin Level     Status: None   Collection Time: 11/09/16 11:00 AM  Result Value Ref Range   Thyroglobulin 32.9 ng/mL    Comment:       Reference Range:       Intact Thyroid   2.8-40.9       Athyrotic        <0.1 .       Note: Abnormal flagging is based       on the reference interval for        patients with intact thyroid. . . This test was performed using the Beckman Coulter  chemiluminescent method. Values obtained from different assay methods cannot be used interchangeably. Thyroglobulin levels, regardless of value, should not be interpreted as  absolute evidence of the presence or absence of disease. .    Comment      Comment: . Thyroglobulin antibodies (TGAB) interfere with thyroglobulin (TG) assays; therefore, TGAB assay should always be performed in conjunction with a TG assay. .      PHQ2/9: [facility] 2[facility] 12/11/2016 11/09/2016 08/11/2016 02/14/2016 11/01/2015  Decreased Interest 1 0 0 1 3  Down, Depressed, Hopeless 0 0 0 1 1  PHQ - 2 Score 1 0 0 2 4  Altered sleeping 0 - - 1 3  Tired, decreased energy 2 - - 1 3  Change in appetite 2 - - 0 3  Feeling bad or failure about yourself  0 - - 0 3  Trouble concentrating 0 - - 0 0  Moving slowly or fidgety/restless 0 - - 0 0  Suicidal thoughts 0 - - 0 0  PHQ-9 Score 5 - - 4 16  Difficult doing work/chores Not difficult at all - - Somewhat difficult Not difficult at all     Fall Risk: Fall Risk  12/11/2016 11/09/2016 08/11/2016 02/14/2016 11/01/2015  Falls in the past year? No No Yes No No  Number falls in past yr: - - 1 - -  Injury with Fall? - - No - -     Functional Status Survey: Is the patient deaf or have difficulty hearing?: No Does the patient have difficulty seeing, even when wearing glasses/contacts?: No Does the patient have difficulty concentrating, remembering, or making decisions?: No Does the patient have difficulty walking or climbing stairs?: No Does the patient have difficulty dressing or bathing?: No Does the patient have difficulty doing errands alone such as visiting a doctor's office or shopping?: No    Assessment & Plan  1. Depression,  major, recurrent, moderate (HCC)  Doing better, back on Prozac, Vyvanse has improved her motivation  2. Binge eating disorder  - lisdexamfetamine (VYVANSE) 40 MG capsule; Take 1 capsule (40 mg total) daily by mouth.  Dispense: 30 capsule; Refill: 0  3. Morbid obesity with BMI of 40.0-44.9, adult (HCC)  She only lost 1 lbs since last visit   4. Iron deficiency anemia, unspecified iron  deficiency anemia type  Hemoccult cards time 3

## 2016-12-11 NOTE — Patient Instructions (Signed)
Take Atarax and flexeril about 6 hours apart ( take Flexeril only for back spasms)

## 2017-01-11 ENCOUNTER — Other Ambulatory Visit: Payer: Self-pay

## 2017-01-11 DIAGNOSIS — F331 Major depressive disorder, recurrent, moderate: Secondary | ICD-10-CM

## 2017-01-11 MED ORDER — FLUOXETINE HCL 20 MG PO TABS: 20.0000 mg | ORAL_TABLET | Freq: Every day | ORAL | 0 refills | Status: DC

## 2017-01-11 NOTE — Telephone Encounter (Signed)
Patient requesting [facility]with [facility].

## 2017-01-21 ENCOUNTER — Other Ambulatory Visit: Payer: Self-pay | Admitting: Family Medicine

## 2017-01-21 ENCOUNTER — Encounter: Payer: Self-pay | Admitting: Family Medicine

## 2017-01-21 DIAGNOSIS — F5081 Binge eating disorder: Secondary | ICD-10-CM

## 2017-01-22 ENCOUNTER — Other Ambulatory Visit: Payer: Self-pay | Admitting: Family Medicine

## 2017-01-22 ENCOUNTER — Other Ambulatory Visit: Payer: Self-pay

## 2017-01-22 DIAGNOSIS — F5081 Binge eating disorder: Secondary | ICD-10-CM

## 2017-01-22 MED ORDER — PREMARIN 0.625 MG/GM VA CREA: 1.0000 | TOPICAL_CREAM | VAGINAL | 12 refills | Status: DC

## 2017-01-22 MED ORDER — LISDEXAMFETAMINE DIMESYLATE 40 MG PO CAPS: 40.0000 mg | ORAL_CAPSULE | Freq: Every day | ORAL | 0 refills | Status: DC

## 2017-01-22 NOTE — Telephone Encounter (Signed)
Done

## 2017-01-22 NOTE — Telephone Encounter (Signed)
Refill request for general medication: Vyvanse 40 mg  Last office visit: 12/11/2016  Last physical exam: None indicated  Follow up: 02/12/2017

## 2017-02-12 ENCOUNTER — Encounter: Payer: Self-pay | Admitting: Family Medicine

## 2017-02-12 ENCOUNTER — Ambulatory Visit: Payer: Federal, State, Local not specified - PPO | Admitting: Family Medicine

## 2017-02-12 VITALS — BP 132/70 | HR 98 | Temp 97.8°F | Resp 16 | Ht 63.0 in | Wt 217.8 lb

## 2017-02-12 DIAGNOSIS — F5081 Binge eating disorder: Secondary | ICD-10-CM | POA: Diagnosis not present

## 2017-02-12 DIAGNOSIS — Z23 Encounter for immunization: Secondary | ICD-10-CM | POA: Diagnosis not present

## 2017-02-12 DIAGNOSIS — E538 Deficiency of other specified B group vitamins: Secondary | ICD-10-CM

## 2017-02-12 DIAGNOSIS — M47816 Spondylosis without myelopathy or radiculopathy, lumbar region: Secondary | ICD-10-CM

## 2017-02-12 DIAGNOSIS — D692 Other nonthrombocytopenic purpura: Secondary | ICD-10-CM

## 2017-02-12 DIAGNOSIS — F331 Major depressive disorder, recurrent, moderate: Secondary | ICD-10-CM

## 2017-02-12 DIAGNOSIS — Z6841 Body Mass Index (BMI) 40.0 and over, adult: Secondary | ICD-10-CM | POA: Diagnosis not present

## 2017-02-12 DIAGNOSIS — F50819 Binge eating disorder, unspecified: Secondary | ICD-10-CM

## 2017-02-12 MED ORDER — LISDEXAMFETAMINE DIMESYLATE 40 MG PO CAPS: 40.0000 mg | ORAL_CAPSULE | Freq: Every day | ORAL | 0 refills | Status: DC

## 2017-02-12 MED ORDER — FLUOXETINE HCL 20 MG PO TABS: 20.0000 mg | ORAL_TABLET | Freq: Every day | ORAL | 0 refills | Status: DC

## 2017-02-12 MED ORDER — LISDEXAMFETAMINE DIMESYLATE 40 MG PO CAPS: 40.0000 mg | ORAL_CAPSULE | ORAL | 0 refills | Status: DC

## 2017-02-12 MED ORDER — CYANOCOBALAMIN 1000 MCG/ML IJ SOLN
1000.0000 ug | INTRAMUSCULAR | Status: DC
  Administered 2017-02-12 – 2017-08-21 (×3): 1000 ug via INTRAMUSCULAR

## 2017-02-12 NOTE — Progress Notes (Signed)
Name: Terri Castillo   MRN: [id number]    DOB: 02-Jul-1950   Date:02/12/2017       Progress Note  Subjective  Chief Complaint  Chief Complaint  Patient presents with  . Follow-up    2 month F/U  . Anxiety    Hydroxyzine kept her wide away and unable to sleep and did not help symptoms  . Depression    Vyvanse is helping with symptoms and giving her energy.    HPI  Depression ModerateRecurrent: she was taking Cymbalta 90mg  but because cost and not working very well, she went back on Lexapro April 2018 Kenlea has failed Prozac, Wellbutrin and Lexapro, she was Pritiq but not any better and  cost was high, so she  went back to Prozac 10/2016 and she states it seems to be working.  She is also on Vyvanse that we started because of binge eating disorder - she has more energy now. She is states no recent crying spells, more motivated. She is worried about daughter, recently diagnosed with breast cancer  History of bariatric surgery: she had gastric bypass in 2010, her lowest weight was 180 lbs, but wasgradually gained weight since.Last labs showed anemia, iron deficiency, needs to get 3 more hemoccult cards, reminded her again to have it done, very important to make sure she does not have colon cancer  Bing eating disorder:  Started on Vyvanse 10/2016 , it has curbed her appetite, she is on 40 mg and states doing well on current dose, lost 9 lbs since last visit. She states she also was working the month of December, moving more, also going to bed early and going to bed early  Insulin Resistance: she denies polyphagia, polydipsia or polyuria.   B12 deficiency: she will get B12 shot today   Chronic low back pain: described as dull, aggravated by activity, better with rest, no radiation down her legs, right now the pain is zero/10   Purpura: on arms stable   Patient Active Problem List   Diagnosis Date Noted  . Insulin resistance 08/11/2016  . Senile purpura (HCC) 08/11/2016   . Facet arthropathy, lumbar 02/02/2016  . Spondylisthesis 01/12/2016  . Chronic bilateral low back pain 11/01/2015  . Iron deficiency anemia 09/04/2014  . Hyperglycemia 09/04/2014  . Perioral dermatitis 09/04/2014  . 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 B12 deficiency 07/02/2014  . Cervical pain 07/02/2014  . Arthralgia of lower leg 07/02/2014  . Insomnia 07/02/2014  . 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 Dyslipidemia 07/02/2014  . Dermatitis, eczematoid 07/02/2014  . Genital herpes in women 07/02/2014  . Gastric reflux 07/02/2014  . Bariatric surgery status 07/02/2014  . Herpes simplex 07/02/2014  . H/O: pneumonia 07/02/2014  . Eczema intertrigo 07/02/2014  . Adult BMI 30+ 07/02/2014  . OP (osteoporosis) 07/02/2014  . 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 Perennial allergic rhinitis with seasonal variation 07/02/2014  . Basal cell papilloma 07/02/2014  . Vitamin D deficiency 07/02/2014    Past Surgical History:  Procedure Laterality Date  . ABDOMINAL HYSTERECTOMY  1994  . CHOLECYSTECTOMY  2000  . EYE SURGERY  2000   Cataract Removal  . EYE SURGERY  2002   Cataract Removal  . GASTRIC BYPASS  2000   Mini  . GASTRIC BYPASS  2010    Family History  Problem Relation Age of Onset  . Dementia Mother   . COPD Mother   . Osteoporosis Mother   . Diabetes Father   . Glaucoma Father   .  Parkinson's disease Father   . Glaucoma Brother   . Heart disease Brother   . Heart Problems Brother   . Brain cancer Daughter        Brain Tumor  . Hypothyroidism Daughter   . Cancer Daughter     Social History   Socioeconomic History  . Marital status: Divorced    Spouse name: Not on file  . Number of children: Not on file  . Years of education: Not on file  . Highest education level: Not on file  Social Needs  . Financial resource strain: Not on file  . Food insecurity - worry: Not on file  . Food insecurity - inability: Not on file  . Transportation needs - medical:  Not on file  . Transportation needs - non-medical: Not on file  Occupational History  . Not on file  Tobacco Use  . Smoking status: Former Smoker    Packs/day: 0.50    Years: 5.00    Pack years: 2.50    Types: Cigarettes    Start date: 01/23/1985    Last attempt to quit: 01/23/1990    Years since quitting: 27.0  . Smokeless tobacco: Never Used  Substance and Sexual Activity  . Alcohol use: No    Alcohol/week: 0.0 oz  . Drug use: No  . Sexual activity: Not Currently  Other Topics Concern  . Not on file  Social History Narrative  . Not on file     Current Outpatient Medications:  .  acetaminophen (TYLENOL) 500 MG tablet, Take 1 tablet by mouth 4 (four) times daily as needed., Disp: , Rfl:  .  calcium-vitamin D (OSCAL WITH D) 500-200 MG-UNIT per tablet, Take 1 tablet by mouth 2 (two) times daily., Disp: 60 tablet, Rfl: 0 .  cetirizine (ZYRTEC) 10 MG tablet, Take 10 mg by mouth daily., Disp: , Rfl:  .  cyclobenzaprine (FLEXERIL) 10 MG tablet, Take 0.5-1 tablets (5-10 mg total) by mouth at bedtime., Disp: 90 tablet, Rfl: 0 .  ferrous sulfate 325 (65 FE) MG tablet, Take 1 tablet (325 mg total) by mouth 2 (two) times daily., Disp: 60 tablet, Rfl: 2 .  FLUoxetine (PROZAC) 20 MG tablet, Take 1 tablet (20 mg total) by mouth daily., Disp: 90 tablet, Rfl: 0 .  fluticasone (FLONASE) 50 MCG/ACT nasal spray, Place 2 sprays into both nostrils at bedtime., Disp: 48 g, Rfl: 1 .  ketoconazole (NIZORAL) 2 % cream, APPLY TO RASH TWICE DAILY, Disp: 90 g, Rfl: 0 .  lisdexamfetamine (VYVANSE) 40 MG capsule, Take 1 capsule (40 mg total) by mouth daily., Disp: 30 capsule, Rfl: 0 .  PREMARIN vaginal cream, Place 1 Applicatorful vaginally 3 (three) times a week., Disp: 42.5 g, Rfl: 12 .  raloxifene (EVISTA) 60 MG tablet, Take 1 tablet (60 mg total) by mouth daily., Disp: 90 tablet, Rfl: 4 .  triamcinolone cream (KENALOG) 0.5 %, Apply 1 application topically daily as needed., Disp: , Rfl:  .  valACYclovir  (VALTREX) 500 MG tablet, Take 1 tablet (500 mg total) by mouth 3 (three) times daily., Disp: 30 tablet, Rfl: 0 .  Vitamin D, Ergocalciferol, (DRISDOL) 50000 units CAPS capsule, Take 1 capsule (50,000 Units total) by mouth every 7 (seven) days., Disp: 12 capsule, Rfl: 1 .  hydrOXYzine (ATARAX/VISTARIL) 10 MG tablet, Take 1 tablet (10 mg total) by mouth every evening. For anxiety (Patient not taking: Reported on 02/12/2017), Disp: 30 tablet, Rfl: 2 .  lisdexamfetamine (VYVANSE) 40 MG capsule, Take 1 capsule (40  mg total) by mouth every morning., Disp: 30 capsule, Rfl: 0 .  lisdexamfetamine (VYVANSE) 40 MG capsule, Take 1 capsule (40 mg total) by mouth every morning., Disp: 30 capsule, Rfl: 0  Allergies  Allergen Reactions  . Topamax  [Topiramate]     severe depression  . Trazodone     Other reaction(s): Dizziness     ROS  Constitutional: Negative for fever , positive for  weight change.  Respiratory: Negative for cough and shortness of breath.   Cardiovascular: Negative for chest pain or palpitations.  Gastrointestinal: Negative for abdominal pain, no bowel changes.  Musculoskeletal: Negative for gait problem or joint swelling.  Skin: Negative for rash.  Neurological: Negative for dizziness or headache.  No other specific complaints in a complete review of systems (except as listed in HPI above).  Objective  Vitals:   02/12/17 0936  BP: 132/70  Pulse: 98  Resp: 16  Temp: 97.8 F (36.6 C)  TempSrc: Oral  SpO2: 96%  Weight: 217 lb 12.8 oz (98.8 kg)  Height: 5\' 3"  (1.6 m)    Body mass index is 38.58 kg/m.  Physical Exam  Constitutional: Patient appears well-developed and well-nourished. Obese  No distress.  HEENT: head atraumatic, normocephalic, pupils equal and reactive to light, neck supple, throat within normal limits Cardiovascular: Normal rate, regular rhythm and normal heart sounds.  No murmur heard. No BLE edema. Pulmonary/Chest: Effort normal and breath sounds  normal. No respiratory distress. Abdominal: Soft.  There is no tenderness. Psychiatric: Patient has a normal mood and affect. behavior is normal. Judgment and thought content normal.  PHQ2/9: [facility] 2/[facility] 02/12/2017 12/11/2016 11/09/2016 08/11/2016 02/14/2016  Decreased Interest 1 1 0 0 1  Down, Depressed, Hopeless 0 0 0 0 1  PHQ - 2 Score 1 1 0 0 2  Altered sleeping 0 0 - - 1  Tired, decreased energy 3 2 - - 1  Change in appetite 1 2 - - 0  Feeling bad or failure about yourself  0 0 - - 0  Trouble concentrating 0 0 - - 0  Moving slowly or fidgety/restless 0 0 - - 0  Suicidal thoughts 0 0 - - 0  PHQ-9 Score 5 5 - - 4  Difficult doing work/chores Not difficult at all Not difficult at all - - Somewhat difficult    Fall Risk: Fall Risk  02/12/2017 12/11/2016 11/09/2016 08/11/2016 02/14/2016  Falls in the past year? No No No Yes No  Number falls in past yr: - - - 1 -  Injury with Fall? - - - No -    Functional Status Survey: Is the patient deaf or have difficulty hearing?: No Does the patient have difficulty seeing, even when wearing glasses/contacts?: No Does the patient have difficulty concentrating, remembering, or making decisions?: No Does the patient have difficulty walking or climbing stairs?: No Does the patient have difficulty dressing or bathing?: No Does the patient have difficulty doing errands alone such as visiting a doctor's office or shopping?: No    Assessment & Plan  1. Depression, major, recurrent, moderate (HCC)  - FLUoxetine (PROZAC) 20 MG tablet; Take 1 tablet (20 mg total) by mouth daily.  Dispense: 90 tablet; Refill: 0  2. Binge eating disorder  - lisdexamfetamine (VYVANSE) 40 MG capsule; Take 1 capsule (40 mg total) by mouth daily.  Dispense: 30 capsule; Refill: 0  3. Need for vaccination for pneumococcus  - Pneumococcal conjugate vaccine 13-valent IM  4. Senile purpura (HCC)  Stable  5. Facet arthropathy, lumbar  Still has pain,  but stable  6. Morbid obesity with BMI of 40.0-44.9, adult (HCC)  Losing weight on Vyvanse.

## 2017-02-20 ENCOUNTER — Other Ambulatory Visit: Payer: Self-pay | Admitting: Family Medicine

## 2017-02-20 DIAGNOSIS — M62830 Muscle spasm of back: Secondary | ICD-10-CM

## 2017-02-22 MED ORDER — CYCLOBENZAPRINE HCL 10 MG PO TABS: 5.0000 mg | ORAL_TABLET | Freq: Every day | ORAL | 0 refills | Status: DC

## 2017-02-26 ENCOUNTER — Other Ambulatory Visit: Payer: Self-pay

## 2017-02-26 DIAGNOSIS — D509 Iron deficiency anemia, unspecified: Secondary | ICD-10-CM

## 2017-02-26 MED ORDER — FERROUS SULFATE 325 (65 FE) MG PO TABS: 325.0000 mg | ORAL_TABLET | Freq: Two times a day (BID) | ORAL | 2 refills | Status: DC

## 2017-02-26 NOTE — Telephone Encounter (Signed)
Refill request for general medication. Ferrous Sulfate to [facility].  Last office visit: 02/12/2017   Follow up on 05/22/2017

## 2017-05-18 ENCOUNTER — Other Ambulatory Visit: Payer: Self-pay | Admitting: Family Medicine

## 2017-05-18 DIAGNOSIS — E559 Vitamin D deficiency, unspecified: Secondary | ICD-10-CM

## 2017-05-18 NOTE — Telephone Encounter (Signed)
Refill request for general medication: Vitamin D  Last office visit: 02/12/2017   Follow up 05/22/2017

## 2017-05-22 ENCOUNTER — Ambulatory Visit: Payer: Medicare Other | Admitting: Family Medicine

## 2017-05-22 ENCOUNTER — Encounter: Payer: Self-pay | Admitting: Family Medicine

## 2017-05-22 VITALS — BP 114/68 | HR 106 | Temp 98.4°F | Resp 18 | Ht 63.0 in | Wt 222.5 lb

## 2017-05-22 DIAGNOSIS — E88819 Insulin resistance, unspecified: Secondary | ICD-10-CM

## 2017-05-22 DIAGNOSIS — Z79899 Other long term (current) drug therapy: Secondary | ICD-10-CM

## 2017-05-22 DIAGNOSIS — D692 Other nonthrombocytopenic purpura: Secondary | ICD-10-CM

## 2017-05-22 DIAGNOSIS — E785 Hyperlipidemia, unspecified: Secondary | ICD-10-CM

## 2017-05-22 DIAGNOSIS — M62830 Muscle spasm of back: Secondary | ICD-10-CM

## 2017-05-22 DIAGNOSIS — F5081 Binge eating disorder: Secondary | ICD-10-CM

## 2017-05-22 DIAGNOSIS — E8881 Metabolic syndrome: Secondary | ICD-10-CM | POA: Diagnosis not present

## 2017-05-22 DIAGNOSIS — Z862 Personal history of diseases of the blood and blood-forming organs and certain disorders involving the immune mechanism: Secondary | ICD-10-CM

## 2017-05-22 DIAGNOSIS — M4696 Unspecified inflammatory spondylopathy, lumbar region: Secondary | ICD-10-CM | POA: Diagnosis not present

## 2017-05-22 DIAGNOSIS — R7989 Other specified abnormal findings of blood chemistry: Secondary | ICD-10-CM

## 2017-05-22 DIAGNOSIS — M810 Age-related osteoporosis without current pathological fracture: Secondary | ICD-10-CM | POA: Diagnosis not present

## 2017-05-22 DIAGNOSIS — F50819 Binge eating disorder, unspecified: Secondary | ICD-10-CM

## 2017-05-22 DIAGNOSIS — R739 Hyperglycemia, unspecified: Secondary | ICD-10-CM

## 2017-05-22 DIAGNOSIS — E538 Deficiency of other specified B group vitamins: Secondary | ICD-10-CM | POA: Diagnosis not present

## 2017-05-22 DIAGNOSIS — Z9884 Bariatric surgery status: Secondary | ICD-10-CM

## 2017-05-22 DIAGNOSIS — Z6841 Body Mass Index (BMI) 40.0 and over, adult: Secondary | ICD-10-CM

## 2017-05-22 DIAGNOSIS — E559 Vitamin D deficiency, unspecified: Secondary | ICD-10-CM | POA: Diagnosis not present

## 2017-05-22 DIAGNOSIS — F331 Major depressive disorder, recurrent, moderate: Secondary | ICD-10-CM

## 2017-05-22 MED ORDER — LISDEXAMFETAMINE DIMESYLATE 50 MG PO CAPS: 50.0000 mg | ORAL_CAPSULE | Freq: Every day | ORAL | 0 refills | Status: DC

## 2017-05-22 MED ORDER — CYCLOBENZAPRINE HCL 10 MG PO TABS: 5.0000 mg | ORAL_TABLET | Freq: Every day | ORAL | 0 refills | Status: DC

## 2017-05-22 MED ORDER — FLUOXETINE HCL 40 MG PO CAPS: 40.0000 mg | ORAL_CAPSULE | Freq: Every day | ORAL | 0 refills | Status: DC

## 2017-05-22 NOTE — Progress Notes (Signed)
Name: FLORDIA KASSEM   MRN: [id number]    DOB: [birth year]   Date:05/22/2017       Progress Note  Subjective  Chief Complaint  Chief Complaint  Patient presents with  . Medication Refill  . Depression  . Anxiety    Stable  . Binge Eating Disorder    Symptoms are improving    HPI  Depression ModerateRecurrent: she was taking Cymbalta  but because cost and not working very well, she went back on Lexapro April 2018, but was afraid of possible side effects and asked to switch back to Prozac 10/2016.  Elorah has failed Prozac in the past, Wellbutrin and Lexapro, she was Pritiq but not any improve symptoms and it was very expensive. She is also on Vyvanse that we started because of binge eating disorder 12/2016 - she has more energy no, but still has crying spells. Currently worried about her niece that moved in with her and her two children - her ex broke up with her.   History of bariatric surgery: she had gastric bypass in 2010, her lowest weight was 180 lbs, but wasgradually gained weight since.Last labs showed anemia, iron deficiency, needs to get 3 more hemoccult cards, reminded her again to have it done, very important to make sure she does not have colon cancer, gave her another set today.   Bing eating disorder: Started on Vyvanse 10/2016  Weight was 226 lbs , it curbed her appetite initially , however starting eat more again, we will increase dose to  50 mg and advised her to return for heart rate and bp check in one month, also to check weight.   Insulin Resistance: she has polyphagia again but denies polydipsia or polyuria. Recheck labs prior to next visit   B12 deficiency: she will get B12 shot today   Chronic low back pain: described as dull, aggravated by activity, better with rest, no radiation down her legs, right now the pain is zero/10 , she takes flexeril at night to help with symptoms.   Purpura: on arms - unchanged     Patient Active Problem  List   Diagnosis Date Noted  . 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 Morbid obesity with BMI of 40.0-44.9, adult (HCC) 02/12/2017  . Insulin resistance 08/11/2016  . Senile purpura (HCC) 08/11/2016  . Facet arthropathy, lumbar 02/02/2016  . Spondylisthesis 01/12/2016  . Chronic bilateral low back pain 11/01/2015  . Iron deficiency anemia 09/04/2014  . Hyperglycemia 09/04/2014  . Perioral dermatitis 09/04/2014  . 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 B12 deficiency 07/02/2014  . Cervical pain 07/02/2014  . Arthralgia of lower leg 07/02/2014  . Insomnia 07/02/2014  . 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 Dyslipidemia 07/02/2014  . Dermatitis, eczematoid 07/02/2014  . Genital herpes in women 07/02/2014  . Gastric reflux 07/02/2014  . Bariatric surgery status 07/02/2014  . Herpes simplex 07/02/2014  . H/O: pneumonia 07/02/2014  . Eczema intertrigo 07/02/2014  . Adult BMI 30+ 07/02/2014  . OP (osteoporosis) 07/02/2014  . 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 Perennial allergic rhinitis with seasonal variation 07/02/2014  . Basal cell papilloma 07/02/2014  . Vitamin D deficiency 07/02/2014    Past Surgical History:  Procedure Laterality Date  . ABDOMINAL HYSTERECTOMY  1994  . CHOLECYSTECTOMY  2000  . EYE SURGERY  2000   Cataract Removal  . EYE SURGERY  2002   Cataract Removal  . GASTRIC BYPASS  2000   Mini  . GASTRIC BYPASS  2010    Family History  Problem Relation Age of Onset  . Dementia Mother   . COPD Mother   . Osteoporosis Mother   . Diabetes Father   . Glaucoma Father   . Parkinson's disease Father   . Glaucoma Brother   . Heart disease Brother   . Heart Problems Brother   . Brain cancer Daughter        Brain Tumor  . Hypothyroidism Daughter   . Cancer Daughter     Social History   Socioeconomic History  . Marital status: Divorced    Spouse name: Not on file  . Number of children: Not on file  . Years of education: Not  on file  . Highest education level: Not on file  Occupational History  . Not on file  Social Needs  . Financial resource strain: Not on file  . Food insecurity:    Worry: Not on file    Inability: Not on file  . Transportation needs:    Medical: Not on file    Non-medical: Not on file  Tobacco Use  . Smoking status: Former Smoker    Packs/day: 0.50    Years: 5.00    Pack years: 2.50    Types: Cigarettes    Start date: 01/23/1985    Last attempt to quit: 01/23/1990    Years since quitting: 27.3  . Smokeless tobacco: Never Used  Substance and Sexual Activity  . Alcohol use: No    Alcohol/week: 0.0 oz  . Drug use: No  . Sexual activity: Not Currently  Lifestyle  . Physical activity:    Days per week: Not on file    Minutes per session: Not on file  . Stress: Not on file  Relationships  . Social connections:    Talks on phone: Not on file    Gets together: Not on file    Attends religious service: Not on file    Active member of club or organization: Not on file    Attends meetings of clubs or organizations: Not on file    Relationship status: Not on file  . Intimate partner violence:    Fear of current or ex partner: Not on file    Emotionally abused: Not on file    Physically abused: Not on file    Forced sexual activity: Not on file  Other Topics Concern  . Not on file  Social History Narrative  . Not on file     Current Outpatient Medications:  .  acetaminophen (TYLENOL) 500 MG tablet, Take 1 tablet by mouth 4 (four) times daily as needed., Disp: , Rfl:  .  cetirizine (ZYRTEC) 10 MG tablet, Take 10 mg by mouth daily., Disp: , Rfl:  .  cyclobenzaprine (FLEXERIL) 10 MG tablet, Take 0.5-1 tablets (5-10 mg total) by mouth at bedtime., Disp: 90 tablet, Rfl: 0 .  ketoconazole (NIZORAL) 2 % cream, APPLY TO RASH TWICE DAILY, Disp: 90 g, Rfl: 0 .  PREMARIN vaginal cream, Place 1 Applicatorful vaginally 3 (three) times a week., Disp: 42.5 g, Rfl: 12 .  raloxifene (EVISTA)  60 MG tablet, Take 1 tablet (60 mg total) by mouth daily., Disp: 90 tablet, Rfl: 4 .  triamcinolone cream (KENALOG) 0.5 %, Apply 1 application topically daily as needed., Disp: , Rfl:  .  valACYclovir (VALTREX) 500 MG tablet, Take 1 tablet (500 mg total) by mouth 3 (three) times daily., Disp: 30 tablet, Rfl: 0 .  Vitamin D, Ergocalciferol, (DRISDOL) 50000  units CAPS capsule, TAKE 1 CAPSULE (50,000 UNITS TOTAL) BY MOUTH EVERY 7 (SEVEN) DAYS., Disp: 12 capsule, Rfl: 1 .  ferrous sulfate 325 (65 FE) MG tablet, Take 1 tablet (325 mg total) by mouth 2 (two) times daily. (Patient not taking: Reported on 05/22/2017), Disp: 60 tablet, Rfl: 2 .  FLUoxetine (PROZAC) 40 MG capsule, Take 1 capsule (40 mg total) by mouth daily., Disp: 90 capsule, Rfl: 0 .  fluticasone (FLONASE) 50 MCG/ACT nasal spray, Place 2 sprays into both nostrils at bedtime. (Patient not taking: Reported on 05/22/2017), Disp: 48 g, Rfl: 1 .  hydrOXYzine (ATARAX/VISTARIL) 10 MG tablet, Take 1 tablet (10 mg total) by mouth every evening. For anxiety (Patient not taking: Reported on 02/12/2017), Disp: 30 tablet, Rfl: 2 .  lisdexamfetamine (VYVANSE) 50 MG capsule, Take 1 capsule (50 mg total) by mouth daily., Disp: 30 capsule, Rfl: 0  Current Facility-Administered Medications:  .  cyanocobalamin ((VITAMIN B-12)) injection 1,000 mcg, 1,000 mcg, Intramuscular, Q30 days, Alba Cory, MD, 1,000 mcg at 02/12/17 1044  Allergies  Allergen Reactions  . Topamax  [Topiramate]     severe depression  . Trazodone     Other reaction(s): Dizziness     ROS  Constitutional: Negative for fever , no significant weight change.  Respiratory: Negative for cough and shortness of breath.   Cardiovascular: Negative for chest pain or palpitations.  Gastrointestinal: Negative for abdominal pain, no bowel changes.  Musculoskeletal: Negative for gait problem or joint swelling.  Skin: Negative for rash.  Neurological: Negative for dizziness or headache.  No  other specific complaints in a complete review of systems (except as listed in HPI above).  Objective  Vitals:   05/22/17 0941  BP: 114/68  Pulse: (!) 106  Resp: 18  Temp: 98.4 F (36.9 C)  TempSrc: Oral  SpO2: 96%  Weight: 222 lb 8 oz (100.9 kg)  Height:  (1.6 m)    Body mass index is 39.41 kg/m.  Physical Exam  Constitutional: Patient appears well-developed and well-nourished. Obese No distress.  HEENT: head atraumatic, normocephalic, pupils equal and reactive to light,  neck supple, throat within normal limits Cardiovascular: Normal rate, regular rhythm and normal heart sounds.  No murmur heard. No BLE edema. Pulmonary/Chest: Effort normal and breath sounds normal. No respiratory distress. Abdominal: Soft.  There is no tenderness. Psychiatric: Patient has a normal mood and affect. behavior is normal. Judgment and thought content normal.  PHQ2/9: Depression screen [facility] 2/9 05/22/2017 02/12/2017 12/11/2016 11/09/2016 08/11/2016  Decreased Interest 0 0  Down, Depressed, Hopeless 0 0 0 0 0  PHQ - 2 Score 0 0  Altered sleeping 0 0 0 - -  Tired, decreased energy - -  Change in appetite - -  Feeling bad or failure about yourself  0 0 0 - -  Trouble concentrating 0 0 0 - -  Moving slowly or fidgety/restless 0 0 0 - -  Suicidal thoughts 0 0 0 - -  PHQ-9 Score - -  Difficult doing work/chores Not difficult at all Not difficult at all Not difficult at all - -     Fall Risk: Fall Risk  05/22/2017 02/12/2017 12/11/2016 11/09/2016 08/11/2016  Falls in the past year? No No No No Yes  Number falls in past yr: - - - - 1  Injury with Fall? - - - - No    Functional Status Survey: Is the patient  deaf or have difficulty hearing?: No Does the patient have difficulty seeing, even when wearing glasses/contacts?: No Does the patient have difficulty concentrating, remembering, or making decisions?: No Does the patient have difficulty walking or climbing  stairs?: No Does the patient have difficulty dressing or bathing?: No Does the patient have difficulty doing errands alone such as visiting a doctor's office or shopping?: No    Assessment & Plan  1. Depression, major, recurrent, moderate (HCC)  - FLUoxetine (PROZAC) 40 MG capsule; Take 1 capsule (40 mg total) by mouth daily.  Dispense: 90 capsule; Refill: 0 - TSH - lisdexamfetamine (VYVANSE) 50 MG capsule; Take 1 capsule (50 mg total) by mouth daily.  Dispense: 30 capsule; Refill: 0  2. Binge eating disorder  - lisdexamfetamine (VYVANSE) 50 MG capsule; Take 1 capsule (50 mg total) by mouth daily.  Dispense: 30 capsule; Refill: 0  3. Senile purpura (HCC)  - CBC with Differential/Platelet  4. Bariatric surgery status   5. Dyslipidemia  - Lipid panel  6. Morbid obesity with BMI of 40.0-44.9, adult [facility])  Discussed with the patient the risk posed by an increased BMI. Discussed importance of portion control, calorie counting and at least 150 minutes of physical activity weekly. Avoid sweet beverages and drink more water. Eat at least 6 servings of fruit and vegetables daily  - TSH  7. Inflammatory spondylopathy of lumbar region [facility])  Taking flexeril prn   8. Abnormal TSH  - TSH  9. Vitamin D deficiency  - VITAMIN D 25 Hydroxy (Vit-D Deficiency, Fractures)  10. B12 deficiency  - Vitamin B12  11. Osteoporosis without current pathological fracture, unspecified osteoporosis type  - VITAMIN D 25 Hydroxy (Vit-D Deficiency, Fractures)  12. Insulin resistance  - Hemoglobin A1c  13. History of iron deficiency anemia  She stopped taking supplements again  - CBC with Differential/Platelet Needs hemoccult   14. Long-term use of high-risk medication  - COMPLETE METABOLIC PANEL WITH GFR  15. Spasm of muscle, back  - cyclobenzaprine (FLEXERIL) 10 MG tablet; Take 0.5-1 tablets (5-10 mg total) by mouth at bedtime.  Dispense: 90 tablet; Refill: 0  16.  Hyperglycemia  - Hemoglobin A1c

## 2017-05-31 ENCOUNTER — Other Ambulatory Visit: Payer: Self-pay

## 2017-05-31 DIAGNOSIS — D509 Iron deficiency anemia, unspecified: Secondary | ICD-10-CM

## 2017-05-31 LAB — POC HEMOCCULT BLD/STL (HOME/3-CARD/SCREEN)
Card #2 Fecal Occult Blod, POC: NEGATIVE
Card #3 Fecal Occult Blood, POC: NEGATIVE
Fecal Occult Blood, POC: NEGATIVE

## 2017-06-07 ENCOUNTER — Other Ambulatory Visit: Payer: Self-pay | Admitting: Family Medicine

## 2017-06-07 DIAGNOSIS — D509 Iron deficiency anemia, unspecified: Secondary | ICD-10-CM

## 2017-07-19 ENCOUNTER — Other Ambulatory Visit: Payer: Self-pay | Admitting: Family Medicine

## 2017-07-19 DIAGNOSIS — F331 Major depressive disorder, recurrent, moderate: Secondary | ICD-10-CM

## 2017-08-16 LAB — HEMOGLOBIN A1C
Hgb A1c MFr Bld: 5.4 % of total Hgb (ref <5.7)
MEAN PLASMA GLUCOSE: 108 (calc)
eAG (mmol/L): 6 (calc)

## 2017-08-16 LAB — CBC WITH DIFFERENTIAL/PLATELET
Basophils Absolute: 39 cells/uL (ref 0–200)
Basophils Relative: 0.7 %
EOS PCT: 3.4 %
Eosinophils Absolute: 190 cells/uL (ref 15–500)
HEMATOCRIT: 40.8 % (ref 35.0–45.0)
HEMOGLOBIN: 13.8 g/dL (ref 11.7–15.5)
LYMPHS ABS: 1210 {cells}/uL (ref 850–3900)
MCH: 30.5 pg (ref 27.0–33.0)
MCHC: 33.8 g/dL (ref 32.0–36.0)
MCV: 90.1 fL (ref 80.0–100.0)
MPV: 9.8 fL (ref 7.5–12.5)
Monocytes Relative: 8.6 %
NEUTROS ABS: 3679 {cells}/uL (ref 1500–7800)
NEUTROS PCT: 65.7 %
Platelets: 349 10*3/uL (ref 140–400)
RBC: 4.53 10*6/uL (ref 3.80–5.10)
RDW: 13 % (ref 11.0–15.0)
Total Lymphocyte: 21.6 %
WBC mixed population: 482 cells/uL (ref 200–950)
WBC: 5.6 10*3/uL (ref 3.8–10.8)

## 2017-08-16 LAB — COMPLETE METABOLIC PANEL WITH GFR
AG Ratio: 1.4 (calc) (ref 1.0–2.5)
ALT: 21 U/L (ref 6–29)
AST: 20 U/L (ref 10–35)
Albumin: 4 g/dL (ref 3.6–5.1)
Alkaline phosphatase (APISO): 117 U/L (ref 33–130)
BILIRUBIN TOTAL: 1 mg/dL (ref 0.2–1.2)
BUN: 8 mg/dL (ref 7–25)
CHLORIDE: 104 mmol/L (ref 98–110)
CO2: 26 mmol/L (ref 20–32)
Calcium: 8.7 mg/dL (ref 8.6–10.4)
Creat: 0.74 mg/dL (ref 0.50–0.99)
GFR, Est African American: 99 mL/min/{1.73_m2} (ref >=60)
GFR, Est Non African American: 85 mL/min/{1.73_m2} (ref >=60)
GLUCOSE: 96 mg/dL (ref 65–99)
Globulin: 2.9 g/dL (calc) (ref 1.9–3.7)
Potassium: 3.6 mmol/L (ref 3.5–5.3)
Sodium: 140 mmol/L (ref 135–146)
Total Protein: 6.9 g/dL (ref 6.1–8.1)

## 2017-08-16 LAB — LIPID PANEL
Cholesterol: 181 mg/dL (ref <200)
HDL: 50 mg/dL — ABNORMAL LOW (ref >50)
LDL Cholesterol (Calc): 104 mg/dL (calc) — ABNORMAL HIGH
NON-HDL CHOLESTEROL (CALC): 131 mg/dL — AB (ref <130)
Total CHOL/HDL Ratio: 3.6 (calc) (ref <5.0)
Triglycerides: 155 mg/dL — ABNORMAL HIGH (ref <150)

## 2017-08-16 LAB — VITAMIN D 25 HYDROXY (VIT D DEFICIENCY, FRACTURES): VIT D 25 HYDROXY: 27 ng/mL — AB (ref 30–100)

## 2017-08-16 LAB — VITAMIN B12: VITAMIN B 12: 290 pg/mL (ref 200–1100)

## 2017-08-16 LAB — TSH: TSH: 5.03 mIU/L — ABNORMAL HIGH (ref 0.40–4.50)

## 2017-08-19 ENCOUNTER — Other Ambulatory Visit: Payer: Self-pay | Admitting: Family Medicine

## 2017-08-19 DIAGNOSIS — M62830 Muscle spasm of back: Secondary | ICD-10-CM

## 2017-08-20 ENCOUNTER — Other Ambulatory Visit: Payer: Self-pay | Admitting: Family Medicine

## 2017-08-20 DIAGNOSIS — F331 Major depressive disorder, recurrent, moderate: Secondary | ICD-10-CM

## 2017-08-21 ENCOUNTER — Ambulatory Visit: Payer: Federal, State, Local not specified - PPO | Admitting: Family Medicine

## 2017-08-21 ENCOUNTER — Encounter: Payer: Self-pay | Admitting: Family Medicine

## 2017-08-21 VITALS — BP 128/68 | HR 106 | Temp 98.2°F | Resp 16 | Ht 63.0 in | Wt 225.1 lb

## 2017-08-21 DIAGNOSIS — Z1231 Encounter for screening mammogram for malignant neoplasm of breast: Secondary | ICD-10-CM

## 2017-08-21 DIAGNOSIS — M810 Age-related osteoporosis without current pathological fracture: Secondary | ICD-10-CM

## 2017-08-21 DIAGNOSIS — F331 Major depressive disorder, recurrent, moderate: Secondary | ICD-10-CM

## 2017-08-21 DIAGNOSIS — E88819 Insulin resistance, unspecified: Secondary | ICD-10-CM

## 2017-08-21 DIAGNOSIS — E785 Hyperlipidemia, unspecified: Secondary | ICD-10-CM

## 2017-08-21 DIAGNOSIS — Z6841 Body Mass Index (BMI) 40.0 and over, adult: Secondary | ICD-10-CM

## 2017-08-21 DIAGNOSIS — E8881 Metabolic syndrome: Secondary | ICD-10-CM

## 2017-08-21 DIAGNOSIS — F5081 Binge eating disorder: Secondary | ICD-10-CM

## 2017-08-21 DIAGNOSIS — R7989 Other specified abnormal findings of blood chemistry: Secondary | ICD-10-CM

## 2017-08-21 DIAGNOSIS — F50819 Binge eating disorder, unspecified: Secondary | ICD-10-CM

## 2017-08-21 DIAGNOSIS — E538 Deficiency of other specified B group vitamins: Secondary | ICD-10-CM | POA: Diagnosis not present

## 2017-08-21 DIAGNOSIS — D692 Other nonthrombocytopenic purpura: Secondary | ICD-10-CM

## 2017-08-21 DIAGNOSIS — E559 Vitamin D deficiency, unspecified: Secondary | ICD-10-CM

## 2017-08-21 MED ORDER — LISDEXAMFETAMINE DIMESYLATE 50 MG PO CAPS: 50.0000 mg | ORAL_CAPSULE | Freq: Every day | ORAL | 0 refills | Status: DC

## 2017-08-21 NOTE — Progress Notes (Signed)
Name: Terri Castillo   MRN: [id number]    DOB: January 31, 1950   Date:08/21/2017       Progress Note  Subjective  Chief Complaint  Chief Complaint  Patient presents with  . Medication Refill  . Depression  . Binge Eating Disorder  . Insulin Resistance  . Back Pain  . History of bariatric surgery    HPI  Depression ModerateRecurrent:Terri Castillo has failed Prozac in the past ( but wanted to go back since it was the one with the least amount of side effects) also failed Duloxetine,  Wellbutrin and Lexapro, she wasPritiq but not any improve symptoms and it was very expensive.She is also on Vyvanse that we started because of binge eating disorder 12/2016 -she has more energy, but she sleeps in and has not been taking it daily otherwise keeps her up at night. She is tearful when she watches a sad show on TV, not because she is sad.   History of bariatric surgery: she had gastric bypass in 2010, her lowest weight was 180 lbs, but wasgradually gained weight since.Anemia resolved, hemoccult cards negative. She is no longer taking iron supplements, advised to take MVI with iron.   Bing eating disorder: Started on Vyvanse 10/2016  Weight was 226 lbs , it curbed her appetite initially , however starting eat more again, we will increased  dose to  50 mg back in April and she also got a refill for May, but has been without for over one month, states when she takes late in the morning it affects her sleep at night. Advised to set alarm and try to take it daily for one month and return for weight check, if she cannot lose about 5 lbs in one month we will stop medication   Insulin Resistance: she has polyphagia again but denies polydipsia or polyuria. Reviewed labs, normal hgbA1C  B12 deficiency: she will get B12 shot today   Chronic low back pain: described as dull, aggravated by activity, better with rest, no radiation down her legs, right now the pain is zero/10   Purpura: on arms - stable.     Patient Active Problem List   Diagnosis Date Noted  . 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 Morbid obesity with BMI of 40.0-44.9, adult (HCC) 02/12/2017  . Insulin resistance 08/11/2016  . Senile purpura (HCC) 08/11/2016  . Facet arthropathy, lumbar 02/02/2016  . Spondylisthesis 01/12/2016  . Chronic bilateral low back pain 11/01/2015  . Iron deficiency anemia 09/04/2014  . Hyperglycemia 09/04/2014  . Perioral dermatitis 09/04/2014  . 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 B12 deficiency 07/02/2014  . Cervical pain 07/02/2014  . Arthralgia of lower leg 07/02/2014  . Insomnia 07/02/2014  . 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 Dyslipidemia 07/02/2014  . Dermatitis, eczematoid 07/02/2014  . Genital herpes in women 07/02/2014  . Gastric reflux 07/02/2014  . Bariatric surgery status 07/02/2014  . Herpes simplex 07/02/2014  . H/O: pneumonia 07/02/2014  . Eczema intertrigo 07/02/2014  . Adult BMI 30+ 07/02/2014  . OP (osteoporosis) 07/02/2014  . 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 Perennial allergic rhinitis with seasonal variation 07/02/2014  . Basal cell papilloma 07/02/2014  . Vitamin D deficiency 07/02/2014    Past Surgical History:  Procedure Laterality Date  . ABDOMINAL HYSTERECTOMY  1994  . CHOLECYSTECTOMY  2000  . EYE SURGERY  2000   Cataract Removal  . EYE SURGERY  2002   Cataract Removal  . GASTRIC BYPASS  2000   Mini  .  GASTRIC BYPASS  2010    Family History  Problem Relation Age of Onset  . Dementia Mother   . COPD Mother   . Osteoporosis Mother   . Diabetes Father   . Glaucoma Father   . Parkinson's disease Father   . Glaucoma Brother   . Heart disease Brother   . Heart Problems Brother   . Brain cancer Daughter        Brain Tumor  . Hypothyroidism Daughter   . Cancer Daughter     Social History   Socioeconomic History  . Marital status: Divorced    Spouse name: Not on file  . Number of children: Not on file   . Years of education: Not on file  . Highest education level: Not on file  Occupational History  . Not on file  Social Needs  . Financial resource strain: Not on file  . Food insecurity:    Worry: Not on file    Inability: Not on file  . Transportation needs:    Medical: Not on file    Non-medical: Not on file  Tobacco Use  . Smoking status: Former Smoker    Packs/day: 0.50    Years: 5.00    Pack years: 2.50    Types: Cigarettes    Start date: 01/23/1985    Last attempt to quit: 01/23/1990    Years since quitting: 27.5  . Smokeless tobacco: Never Used  Substance and Sexual Activity  . Alcohol use: No    Alcohol/week: 0.0 oz  . Drug use: No  . Sexual activity: Not Currently  Lifestyle  . Physical activity:    Days per week: Not on file    Minutes per session: Not on file  . Stress: Not on file  Relationships  . Social connections:    Talks on phone: Not on file    Gets together: Not on file    Attends religious service: Not on file    Active member of club or organization: Not on file    Attends meetings of clubs or organizations: Not on file    Relationship status: Not on file  . Intimate partner violence:    Fear of current or ex partner: Not on file    Emotionally abused: Not on file    Physically abused: Not on file    Forced sexual activity: Not on file  Other Topics Concern  . Not on file  Social History Narrative  . Not on file     Current Outpatient Medications:  .  acetaminophen (TYLENOL) 500 MG tablet, Take 1 tablet by mouth 4 (four) times daily as needed., Disp: , Rfl:  .  cetirizine (ZYRTEC) 10 MG tablet, Take 10 mg by mouth daily., Disp: , Rfl:  .  cyclobenzaprine (FLEXERIL) 10 MG tablet, TAKE 1/2 TO 1 TABLETS (5-10 MG TOTAL) BY MOUTH AT BEDTIME., Disp: 90 tablet, Rfl: 0 .  ferrous sulfate 325 (65 FE) MG tablet, TAKE 1 TABLET BY MOUTH TWICE A DAY, Disp: 60 tablet, Rfl: 2 .  FLUoxetine (PROZAC) 40 MG capsule, TAKE 1 CAPSULE BY MOUTH EVERY DAY, Disp:  90 capsule, Rfl: 0 .  fluticasone (FLONASE) 50 MCG/ACT nasal spray, Place 2 sprays into both nostrils at bedtime., Disp: 48 g, Rfl: 1 .  hydrOXYzine (ATARAX/VISTARIL) 10 MG tablet, Take 1 tablet (10 mg total) by mouth every evening. For anxiety, Disp: 30 tablet, Rfl: 2 .  ketoconazole (NIZORAL) 2 % cream, APPLY TO RASH TWICE DAILY, Disp: 90 g,  Rfl: 0 .  lisdexamfetamine (VYVANSE) 50 MG capsule, Take 1 capsule (50 mg total) by mouth daily., Disp: 30 capsule, Rfl: 0 .  PREMARIN vaginal cream, Place 1 Applicatorful vaginally 3 (three) times a week., Disp: 42.5 g, Rfl: 12 .  raloxifene (EVISTA) 60 MG tablet, Take 1 tablet (60 mg total) by mouth daily., Disp: 90 tablet, Rfl: 4 .  triamcinolone cream (KENALOG) 0.5 %, Apply 1 application topically daily as needed., Disp: , Rfl:  .  valACYclovir (VALTREX) 500 MG tablet, Take 1 tablet (500 mg total) by mouth 3 (three) times daily., Disp: 30 tablet, Rfl: 0 .  Vitamin D, Ergocalciferol, (DRISDOL) 50000 units CAPS capsule, TAKE 1 CAPSULE (50,000 UNITS TOTAL) BY MOUTH EVERY 7 (SEVEN) DAYS., Disp: 12 capsule, Rfl: 1  Current Facility-Administered Medications:  .  cyanocobalamin ((VITAMIN B-12)) injection 1,000 mcg, 1,000 mcg, Intramuscular, Q30 days, Alba Cory, MD, 1,000 mcg at 05/22/17 1056  Allergies  Allergen Reactions  . Topamax  [Topiramate]     severe depression  . Trazodone     Other reaction(s): Dizziness     ROS  Constitutional: Negative for fever or weight change.  Respiratory: Negative for cough and shortness of breath.   Cardiovascular: Negative for chest pain or palpitations.  Gastrointestinal: Negative for abdominal pain, no bowel changes.  Musculoskeletal: Negative for gait problem or joint swelling.  Skin: Negative for rash.  Neurological: Negative for dizziness or headache.  No other specific complaints in a complete review of systems (except as listed in HPI above).  Objective  Vitals:   08/21/17 0900  BP: 128/68   Pulse: (!) 106  Resp: 16  Temp: 98.2 F (36.8 C)  TempSrc: Oral  SpO2: 98%  Weight: 225 lb 1.6 oz (102.1 kg)  Height: 5\' 3"  (1.6 m)    Body mass index is 39.87 kg/m.  Physical Exam  Constitutional: Patient appears well-developed and well-nourished. Obese  No distress.  HEENT: head atraumatic, normocephalic, pupils equal and reactive to light,  neck supple, throat within normal limits Cardiovascular: Normal rate, regular rhythm and normal heart sounds.  No murmur heard. No BLE edema. Pulmonary/Chest: Effort normal and breath sounds normal. No respiratory distress. Abdominal: Soft.  There is no tenderness. Psychiatric: Patient has a normal mood and affect. behavior is normal. Judgment and thought content normal.   Recent Results (from the past 2160 hour(s))  POC Hemoccult Bld/Stl (3-Cd Home Screen)     Status: Normal   Collection Time: 05/31/17  4:10 PM  Result Value Ref Range   Card #1 Date 05/24/2017    Fecal Occult Blood, POC Negative Negative   Card #2 Date 05/25/2017    Card #2 Fecal Occult Blod, POC Negative    Card #3 Date 05/26/2017    Card #3 Fecal Occult Blood, POC Negative   COMPLETE METABOLIC PANEL WITH GFR     Status: None   Collection Time: 08/15/17  8:36 AM  Result Value Ref Range   Glucose, Bld 96 65 - 99 mg/dL    Comment: .            Fasting reference interval .    BUN 8 7 - 25 mg/dL   Creat 1.61 0.96 - 0.45 mg/dL    Comment: For patients >66 years of age, the reference limit for Creatinine is approximately 13% higher for people identified as African-American. .    GFR, Est Non African American 85 > OR = 60 mL/min/1.2m2   GFR, Est African American 99 > OR =  60 mL/min/1.41m2   BUN/Creatinine Ratio NOT APPLICABLE 6 - 22 (calc)   Sodium 140 135 - 146 mmol/L   Potassium 3.6 3.5 - 5.3 mmol/L   Chloride 104 98 - 110 mmol/L   CO2 26 20 - 32 mmol/L   Calcium 8.7 8.6 - 10.4 mg/dL   Total Protein 6.9 6.1 - 8.1 g/dL   Albumin 4.0 3.6 - 5.1 g/dL    Globulin 2.9 1.9 - 3.7 g/dL (calc)   AG Ratio 1.4 1.0 - 2.5 (calc)   Total Bilirubin 1.0 0.2 - 1.2 mg/dL   Alkaline phosphatase (APISO) 117 33 - 130 U/L   AST 20 10 - 35 U/L   ALT 21 6 - 29 U/L  CBC with Differential/Platelet     Status: None   Collection Time: 08/15/17  8:36 AM  Result Value Ref Range   WBC 5.6 3.8 - 10.8 Thousand/uL   RBC 4.53 3.80 - 5.10 Million/uL   Hemoglobin 13.8 11.7 - 15.5 g/dL   HCT 16.1 09.6 - 04.5 %   MCV 90.1 80.0 - 100.0 fL   MCH 30.5 27.0 - 33.0 pg   MCHC 33.8 32.0 - 36.0 g/dL   RDW 40.9 81.1 - 91.4 %   Platelets 349 140 - 400 Thousand/uL   MPV 9.8 7.5 - 12.5 fL   Neutro Abs 3,679 1,500 - 7,800 cells/uL   Lymphs Abs 1,210 850 - 3,900 cells/uL   WBC mixed population 482 200 - 950 cells/uL   Eosinophils Absolute 190 15 - 500 cells/uL   Basophils Absolute 39 0 - 200 cells/uL   Neutrophils Relative % 65.7 %   Total Lymphocyte 21.6 %   Monocytes Relative 8.6 %   Eosinophils Relative 3.4 %   Basophils Relative 0.7 %  Hemoglobin A1c     Status: None   Collection Time: 08/15/17  8:36 AM  Result Value Ref Range   Hgb A1c MFr Bld 5.4 <5.7 % of total Hgb    Comment: For the purpose of screening for the presence of diabetes: . <5.7%       Consistent with the absence of diabetes 5.7-6.4%    Consistent with increased risk for diabetes             (prediabetes) > or =6.5%  Consistent with diabetes . This assay result is consistent with a decreased risk of diabetes. . Currently, no consensus exists regarding use of hemoglobin A1c for diagnosis of diabetes in children. . According to American Diabetes Association (ADA) guidelines, hemoglobin A1c <7.0% represents optimal control in non-pregnant diabetic patients. Different metrics may apply to specific patient populations.  Standards of Medical Care in Diabetes(ADA). .    Mean Plasma Glucose 108 (calc)   eAG (mmol/L) 6.0 (calc)  Lipid panel     Status: Abnormal   Collection Time: 08/15/17  8:36 AM   Result Value Ref Range   Cholesterol 181 <200 mg/dL   HDL 50 (L) >78 mg/dL   Triglycerides 295 (H) <150 mg/dL   LDL Cholesterol (Calc) 104 (H) mg/dL (calc)    Comment: Reference range: <100 . Desirable range <100 mg/dL for primary prevention;   <70 mg/dL for patients with CHD or diabetic patients  with > or = 2 CHD risk factors. Marland Kitchen LDL-C is now calculated using the Martin-Hopkins  calculation, which is a validated novel method providing  better accuracy than the Friedewald equation in the  estimation of LDL-C.  Horald Pollen et al. Lenox Ahr. 6213;086(57): 2061-2068  (http://education.[facility].com/faq/FAQ164)  Total CHOL/HDL Ratio 3.6 <5.0 (calc)   Non-HDL Cholesterol (Calc) 131 (H) <130 mg/dL (calc)    Comment: For patients with diabetes plus 1 major ASCVD risk  factor, treating to a non-HDL-C goal of <100 mg/dL  (LDL-C of <16<70 mg/dL) is considered a therapeutic  option.   Vitamin B12     Status: None   Collection Time: 08/15/17  8:36 AM  Result Value Ref Range   Vitamin B-12 290 200 - 1,100 pg/mL    Comment: . Please Note: Although the reference range for vitamin B12 is [phone] pg/mL, it has been reported that between 5 and 10% of patients with values between 200 and 400 pg/mL may experience neuropsychiatric and hematologic abnormalities due to occult B12 deficiency; less than 1% of patients with values above 400 pg/mL will have symptoms. Marland Kitchen.   VITAMIN D 25 Hydroxy (Vit-D Deficiency, Fractures)     Status: Abnormal   Collection Time: 08/15/17  8:36 AM  Result Value Ref Range   Vit D, 25-Hydroxy 27 (L) 30 - 100 ng/mL    Comment: Vitamin D Status         25-OH Vitamin D: . Deficiency:                    <20 ng/mL Insufficiency:             20 - 29 ng/mL Optimal:                 > or = 30 ng/mL . For 25-OH Vitamin D testing on patients on  D2-supplementation and patients for whom quantitation  of D2 and D3 fractions is required, the [facility]ureD(TM) 25-OH VIT D,  (D2,D3), LC/MS/MS is recommended: order  code [id number] (patients >2046yrs). . For more information on this test, go to: http://education.[facility].com/faq/FAQ163 (This link is being provided for  informational/educational purposes only.)   TSH     Status: Abnormal   Collection Time: 08/15/17  8:36 AM  Result Value Ref Range   TSH 5.03 (H) 0.40 - 4.50 mIU/L      PHQ2/9: Depression screen [facility] 08/21/2017 05/22/2017 02/12/2017 12/11/2016 11/09/2016  Decreased Interest 1 1 1 1  0  Down, Depressed, Hopeless 0 0 0 0 0  PHQ - 2 Score 1 1 1 1  0  Altered sleeping 0 0 0 0 -  Tired, decreased energy 2 2 3 2  -  Change in appetite 3 1 1 2  -  Feeling bad or failure about yourself  0 0 0 0 -  Trouble concentrating 0 0 0 0 -  Moving slowly or fidgety/restless 0 0 0 0 -  Suicidal thoughts 0 0 0 0 -  PHQ-9 Score 6 4 5 5  -  Difficult doing work/chores Not difficult at all Not difficult at all Not difficult at all Not difficult at all -     Fall Risk: Fall Risk  08/21/2017 05/22/2017 02/12/2017 12/11/2016 11/09/2016  Falls in the past year? No No No No No  Number falls in past yr: - - - - -  Injury with Fall? - - - - -     Functional Status Survey: Is the patient deaf or have difficulty hearing?: No Does the patient have difficulty seeing, even when wearing glasses/contacts?: Yes Does the patient have difficulty concentrating, remembering, or making decisions?: No Does the patient have difficulty walking or climbing stairs?: No Does the patient have difficulty dressing or bathing?: No Does the patient have difficulty doing errands alone such as  visiting a doctor's office or shopping?: No    Assessment & Plan  1. Depression, major, recurrent, moderate (HCC)  - lisdexamfetamine (VYVANSE) 50 MG capsule; Take 1 capsule (50 mg total) by mouth daily.  Dispense: 30 capsule; Refill: 0  2. Binge eating disorder  - lisdexamfetamine (VYVANSE) 50 MG capsule; Take 1 capsule (50 mg total) by  mouth daily.  Dispense: 30 capsule; Refill: 0  3. Dyslipidemia  Lipid panel improved, continue life style modification   4. Morbid obesity with BMI of 40.0-44.9, adult [facility])  Discussed with the patient the risk posed by an increased BMI. Discussed importance of portion control, calorie counting and at least 150 minutes of physical activity weekly. Avoid sweet beverages and drink more water. Eat at least 6 servings of fruit and vegetables daily   5. Senile purpura (HCC)  stable  6. Abnormal TSH  Subclinical hypothyroidism, discussed starting low dose levothyroxine, however she prefers holding off for now, we will recheck next visit.   7. Vitamin D deficiency  Resume vitamin D level   8. Insulin resistance  Discussed life style modification again   9. Osteoporosis without current pathological fracture, unspecified osteoporosis type  - DG Bone Density; Future  10. Encounter for screening mammogram for breast cancer  - MM DIGITAL SCREENING BILATERAL; Future

## 2017-09-15 ENCOUNTER — Other Ambulatory Visit: Payer: Self-pay | Admitting: Family Medicine

## 2017-09-15 DIAGNOSIS — F331 Major depressive disorder, recurrent, moderate: Secondary | ICD-10-CM

## 2017-10-09 ENCOUNTER — Encounter: Payer: Self-pay | Admitting: Family Medicine

## 2017-10-11 ENCOUNTER — Ambulatory Visit: Payer: Federal, State, Local not specified - PPO | Admitting: Family Medicine

## 2017-10-11 ENCOUNTER — Encounter: Payer: Self-pay | Admitting: Family Medicine

## 2017-10-11 VITALS — BP 126/80 | HR 102 | Temp 98.5°F | Resp 16 | Ht 63.0 in | Wt 227.9 lb

## 2017-10-11 DIAGNOSIS — Z23 Encounter for immunization: Secondary | ICD-10-CM

## 2017-10-11 DIAGNOSIS — R002 Palpitations: Secondary | ICD-10-CM

## 2017-10-11 NOTE — Progress Notes (Signed)
Name: Terri Castillo   MRN: [id number]    DOB: 22-Nov-1950   Date:10/11/2017       Progress Note  Subjective  Chief Complaint  Chief Complaint  Patient presents with  . Atrial Fibrillation  . Immunizations    hd flu     HPI  AFib: she states she has an apple watch a few months ago, and she felt a fluttering sensation on her chest - which has happened over the years, usually sporadic and not associated with nausea, vomiting, chest pain or diaphoresis. Lasts a few seconds and resolves by itself. She pushed the EKG bottom and it recorded afib. We calculated her CHACDS2VASc score and it was 8 with a change of stroke of 6.7% per year. She had gastric bypass surgery and cannot take nsaid's. Discussed referral to cardiologist but she states very seldom has symptoms and would like to hold off, she is willing to try taking 81 mg aspirin and if tolerated we will consider to 325 mg daily . Discussed symptoms of stroke and heart attacks and importance of calling 911   Patient Active Problem List   Diagnosis Date Noted  . 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 Morbid obesity with BMI of 40.0-44.9, adult (HCC) 02/12/2017  . Insulin resistance 08/11/2016  . Senile purpura (HCC) 08/11/2016  . Facet arthropathy, lumbar 02/02/2016  . Spondylisthesis 01/12/2016  . Chronic bilateral low back pain 11/01/2015  . Iron deficiency anemia 09/04/2014  . Hyperglycemia 09/04/2014  . Perioral dermatitis 09/04/2014  . 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 B12 deficiency 07/02/2014  . Cervical pain 07/02/2014  . Arthralgia of lower leg 07/02/2014  . Insomnia 07/02/2014  . 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 Dyslipidemia 07/02/2014  . Dermatitis, eczematoid 07/02/2014  . Genital herpes in women 07/02/2014  . Gastric reflux 07/02/2014  . Bariatric surgery status 07/02/2014  . Herpes simplex 07/02/2014  . H/O: pneumonia 07/02/2014  . Eczema intertrigo 07/02/2014  . Adult  BMI 30+ 07/02/2014  . OP (osteoporosis) 07/02/2014  . 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 Perennial allergic rhinitis with seasonal variation 07/02/2014  . Basal cell papilloma 07/02/2014  . Vitamin D deficiency 07/02/2014    Past Surgical History:  Procedure Laterality Date  . ABDOMINAL HYSTERECTOMY  1994  . CHOLECYSTECTOMY  2000  . EYE SURGERY  2000   Cataract Removal  . EYE SURGERY  2002   Cataract Removal  . GASTRIC BYPASS  2000   Mini  . GASTRIC BYPASS  2010    Family History  Problem Relation Age of Onset  . Dementia Mother   . COPD Mother   . Osteoporosis Mother   . Diabetes Father   . Glaucoma Father   . Parkinson's disease Father   . Glaucoma Brother   . Heart disease Brother   . Heart Problems Brother   . Brain cancer Daughter        Brain Tumor  . Hypothyroidism Daughter   . Cancer Daughter     Social History   Socioeconomic History  . Marital status: Divorced    Spouse name: Not on file  . Number of children: 2  . Years of education: Not on file  . Highest education level: Some college, no degree  Occupational History  . Not on file  Social Needs  . Financial resource strain: Not hard at all  . Food insecurity:    Worry: Never true    Inability: Never true  . Transportation needs:  Medical: No    Non-medical: No  Tobacco Use  . Smoking status: Former Smoker    Packs/day: 0.50    Years: 5.00    Pack years: 2.50    Types: Cigarettes    Start date: 01/23/1985    Last attempt to quit: 01/23/1990    Years since quitting: 27.7  . Smokeless tobacco: Never Used  Substance and Sexual Activity  . Alcohol use: No    Alcohol/week: 0.0 standard drinks  . Drug use: No  . Sexual activity: Not Currently  Lifestyle  . Physical activity:    Days per week: 0 days    Minutes per session: 0 min  . Stress: Not at all  Relationships  . Social connections:    Talks on phone: More than three times a week    Gets together: Twice a week    Attends religious  service: Never    Active member of club or organization: No    Attends meetings of clubs or organizations: Never    Relationship status: Divorced  . Intimate partner violence:    Fear of current or ex partner: No    Emotionally abused: No    Physically abused: No    Forced sexual activity: No  Other Topics Concern  . Not on file  Social History Narrative  . Not on file     Current Outpatient Medications:  .  acetaminophen (TYLENOL) 500 MG tablet, Take 1 tablet by mouth 4 (four) times daily as needed., Disp: , Rfl:  .  cetirizine (ZYRTEC) 10 MG tablet, Take 10 mg by mouth daily., Disp: , Rfl:  .  cyclobenzaprine (FLEXERIL) 10 MG tablet, TAKE 1/2 TO 1 TABLETS (5-10 MG TOTAL) BY MOUTH AT BEDTIME., Disp: 90 tablet, Rfl: 0 .  ferrous sulfate 325 (65 FE) MG tablet, TAKE 1 TABLET BY MOUTH TWICE A DAY, Disp: 60 tablet, Rfl: 2 .  FLUoxetine (PROZAC) 40 MG capsule, TAKE 1 CAPSULE BY MOUTH EVERY DAY, Disp: 90 capsule, Rfl: 0 .  fluticasone (FLONASE) 50 MCG/ACT nasal spray, Place 2 sprays into both nostrils at bedtime., Disp: 48 g, Rfl: 1 .  hydrOXYzine (ATARAX/VISTARIL) 10 MG tablet, Take 1 tablet (10 mg total) by mouth every evening. For anxiety, Disp: 30 tablet, Rfl: 2 .  ketoconazole (NIZORAL) 2 % cream, APPLY TO RASH TWICE DAILY, Disp: 90 g, Rfl: 0 .  lisdexamfetamine (VYVANSE) 50 MG capsule, Take 1 capsule (50 mg total) by mouth daily., Disp: 30 capsule, Rfl: 0 .  PREMARIN vaginal cream, Place 1 Applicatorful vaginally 3 (three) times a week., Disp: 42.5 g, Rfl: 12 .  raloxifene (EVISTA) 60 MG tablet, Take 1 tablet (60 mg total) by mouth daily., Disp: 90 tablet, Rfl: 4 .  triamcinolone cream (KENALOG) 0.5 %, Apply 1 application topically daily as needed., Disp: , Rfl:  .  valACYclovir (VALTREX) 500 MG tablet, Take 1 tablet (500 mg total) by mouth 3 (three) times daily., Disp: 30 tablet, Rfl: 0 .  Vitamin D, Ergocalciferol, (DRISDOL) 50000 units CAPS capsule, TAKE 1 CAPSULE (50,000 UNITS  TOTAL) BY MOUTH EVERY 7 (SEVEN) DAYS., Disp: 12 capsule, Rfl: 1  Current Facility-Administered Medications:  .  cyanocobalamin ((VITAMIN B-12)) injection 1,000 mcg, 1,000 mcg, Intramuscular, Q30 days, Alba Cory, MD, 1,000 mcg at 08/21/17 0955  Allergies  Allergen Reactions  . Topamax  [Topiramate]     severe depression  . Trazodone     Other reaction(s): Dizziness    I personally reviewed active problem list, medication list,  allergies, family history, social history with the patient/caregiver today.   ROS  Ten systems reviewed and is negative except as mentioned in HPI  Objective  Vitals:   10/11/17 1151  BP: 126/80  Pulse: (!) 102  Resp: 16  Temp: 98.5 F (36.9 C)  TempSrc: Oral  SpO2: 96%  Weight: 227 lb 14.4 oz (103.4 kg)  Height: 5\' 3"  (1.6 m)    Body mass index is 40.37 kg/m.  Physical Exam  Constitutional: Patient appears well-developed and well-nourished. Obese No distress.  HEENT: head atraumatic, normocephalic, pupils equal and reactive to light, neck supple, throat within normal limits Cardiovascular: Normal rate, regular rhythm and normal heart sounds.  No murmur heard. No BLE edema. Pulmonary/Chest: Effort normal and breath sounds normal. No respiratory distress. Abdominal: Soft.  There is no tenderness. Psychiatric: Patient has a normal mood and affect. behavior is normal. Judgment and thought content normal.  Recent Results (from the past 2160 hour(s))  COMPLETE METABOLIC PANEL WITH GFR     Status: None   Collection Time: 08/15/17  8:36 AM  Result Value Ref Range   Glucose, Bld 96 65 - 99 mg/dL    Comment: .            Fasting reference interval .    BUN 8 7 - 25 mg/dL   Creat 1.61 0.96 - 0.45 mg/dL    Comment: For patients >80 years of age, the reference limit for Creatinine is approximately 13% higher for people identified as African-American. .    GFR, Est Non African American 85 > OR = 60 mL/min/1.82m2   GFR, Est African  American 99 > OR = 60 mL/min/1.36m2   BUN/Creatinine Ratio NOT APPLICABLE 6 - 22 (calc)   Sodium 140 135 - 146 mmol/L   Potassium 3.6 3.5 - 5.3 mmol/L   Chloride 104 98 - 110 mmol/L   CO2 26 20 - 32 mmol/L   Calcium 8.7 8.6 - 10.4 mg/dL   Total Protein 6.9 6.1 - 8.1 g/dL   Albumin 4.0 3.6 - 5.1 g/dL   Globulin 2.9 1.9 - 3.7 g/dL (calc)   AG Ratio 1.4 1.0 - 2.5 (calc)   Total Bilirubin 1.0 0.2 - 1.2 mg/dL   Alkaline phosphatase (APISO) 117 33 - 130 U/L   AST 20 10 - 35 U/L   ALT 21 6 - 29 U/L  CBC with Differential/Platelet     Status: None   Collection Time: 08/15/17  8:36 AM  Result Value Ref Range   WBC 5.6 3.8 - 10.8 Thousand/uL   RBC 4.53 3.80 - 5.10 Million/uL   Hemoglobin 13.8 11.7 - 15.5 g/dL   HCT 40.9 81.1 - 91.4 %   MCV 90.1 80.0 - 100.0 fL   MCH 30.5 27.0 - 33.0 pg   MCHC 33.8 32.0 - 36.0 g/dL   RDW 78.2 95.6 - 21.3 %   Platelets 349 140 - 400 Thousand/uL   MPV 9.8 7.5 - 12.5 fL   Neutro Abs 3,679 1,500 - 7,800 cells/uL   Lymphs Abs 1,210 850 - 3,900 cells/uL   WBC mixed population 482 200 - 950 cells/uL   Eosinophils Absolute 190 15 - 500 cells/uL   Basophils Absolute 39 0 - 200 cells/uL   Neutrophils Relative % 65.7 %   Total Lymphocyte 21.6 %   Monocytes Relative 8.6 %   Eosinophils Relative 3.4 %   Basophils Relative 0.7 %  Hemoglobin A1c     Status: None   Collection Time:  08/15/17  8:36 AM  Result Value Ref Range   Hgb A1c MFr Bld 5.4 <5.7 % of total Hgb    Comment: For the purpose of screening for the presence of diabetes: . <5.7%       Consistent with the absence of diabetes 5.7-6.4%    Consistent with increased risk for diabetes             (prediabetes) > or =6.5%  Consistent with diabetes . This assay result is consistent with a decreased risk of diabetes. . Currently, no consensus exists regarding use of hemoglobin A1c for diagnosis of diabetes in children. . According to American Diabetes Association (ADA) guidelines, hemoglobin A1c  <7.0% represents optimal control in non-pregnant diabetic patients. Different metrics may apply to specific patient populations.  Standards of Medical Care in Diabetes(ADA). .    Mean Plasma Glucose 108 (calc)   eAG (mmol/L) 6.0 (calc)  Lipid panel     Status: Abnormal   Collection Time: 08/15/17  8:36 AM  Result Value Ref Range   Cholesterol 181 <200 mg/dL   HDL 50 (L) >69 mg/dL   Triglycerides 629 (H) <150 mg/dL   LDL Cholesterol (Calc) 104 (H) mg/dL (calc)    Comment: Reference range: <100 . Desirable range <100 mg/dL for primary prevention;   <70 mg/dL for patients with CHD or diabetic patients  with > or = 2 CHD risk factors. Marland Kitchen LDL-C is now calculated using the Martin-Hopkins  calculation, which is a validated novel method providing  better accuracy than the Friedewald equation in the  estimation of LDL-C.  Horald Pollen et al. Lenox Ahr. 5284;132(44): 2061-2068  (http://education.[facility].com/faq/FAQ164)    Total CHOL/HDL Ratio 3.6 <5.0 (calc)   Non-HDL Cholesterol (Calc) 131 (H) <130 mg/dL (calc)    Comment: For patients with diabetes plus 1 major ASCVD risk  factor, treating to a non-HDL-C goal of <100 mg/dL  (LDL-C of <01 mg/dL) is considered a therapeutic  option.   Vitamin B12     Status: None   Collection Time: 08/15/17  8:36 AM  Result Value Ref Range   Vitamin B-12 290 200 - 1,100 pg/mL    Comment: . Please Note: Although the reference range for vitamin B12 is [phone] pg/mL, it has been reported that between 5 and 10% of patients with values between 200 and 400 pg/mL may experience neuropsychiatric and hematologic abnormalities due to occult B12 deficiency; less than 1% of patients with values above 400 pg/mL will have symptoms. Marland Kitchen   VITAMIN D 25 Hydroxy (Vit-D Deficiency, Fractures)     Status: Abnormal   Collection Time: 08/15/17  8:36 AM  Result Value Ref Range   Vit D, 25-Hydroxy 27 (L) 30 - 100 ng/mL    Comment: Vitamin D Status         25-OH  Vitamin D: . Deficiency:                    <20 ng/mL Insufficiency:             20 - 29 ng/mL Optimal:                 > or = 30 ng/mL . For 25-OH Vitamin D testing on patients on  D2-supplementation and patients for whom quantitation  of D2 and D3 fractions is required, the [facility](TM) 25-OH VIT D, (D2,D3), LC/MS/MS is recommended: order  code 02725 (patients >39yrs). . For more information on this test, go to: http://education.[facility].com/faq/FAQ163 (This link is being  provided for  informational/educational purposes only.)   TSH     Status: Abnormal   Collection Time: 08/15/17  8:36 AM  Result Value Ref Range   TSH 5.03 (H) 0.40 - 4.50 mIU/L     PHQ2/9: Depression screen [facility] 2/9 10/11/2017 08/21/2017 05/22/2017 02/12/2017 12/11/2016  Decreased Interest 0 1 1 1 1   Down, Depressed, Hopeless 0 0 0 0 0  PHQ - 2 Score 0 1 1 1 1   Altered sleeping 0 0 0 0 0  Tired, decreased energy 3 2 2 3 2   Change in appetite 3 3 1 1 2   Feeling bad or failure about yourself  0 0 0 0 0  Trouble concentrating 0 0 0 0 0  Moving slowly or fidgety/restless 0 0 0 0 0  Suicidal thoughts 0 0 0 0 0  PHQ-9 Score 6 6 4 5 5   Difficult doing work/chores - Not difficult at all Not difficult at all Not difficult at all Not difficult at all  Some recent data might be hidden     Fall Risk: Fall Risk  10/11/2017 08/21/2017 05/22/2017 02/12/2017 12/11/2016  Falls in the past year? No No No No No  Number falls in past yr: - - - - -  Injury with Fall? - - - - -      Functional Status Survey: Is the patient deaf or have difficulty hearing?: No Does the patient have difficulty seeing, even when wearing glasses/contacts?: Yes Does the patient have difficulty concentrating, remembering, or making decisions?: No Does the patient have difficulty walking or climbing stairs?: No Does the patient have difficulty dressing or bathing?: No Does the patient have difficulty doing errands alone such as  visiting a doctor's office or shopping?: No    Assessment & Plan  1. Fluttering sensation of heart  She wants to hold off on referral to cardiologist   2. Need for influenza vaccination  - Flu vaccine HIGH DOSE PF

## 2017-10-30 ENCOUNTER — Other Ambulatory Visit: Payer: Self-pay | Admitting: Family Medicine

## 2017-10-30 DIAGNOSIS — E559 Vitamin D deficiency, unspecified: Secondary | ICD-10-CM

## 2017-11-20 ENCOUNTER — Other Ambulatory Visit: Payer: Self-pay | Admitting: Family Medicine

## 2017-11-20 DIAGNOSIS — M62830 Muscle spasm of back: Secondary | ICD-10-CM

## 2017-11-21 ENCOUNTER — Ambulatory Visit: Payer: Federal, State, Local not specified - PPO | Admitting: Family Medicine

## 2017-11-21 ENCOUNTER — Other Ambulatory Visit: Payer: Self-pay | Admitting: Family Medicine

## 2017-11-21 ENCOUNTER — Encounter: Payer: Self-pay | Admitting: Family Medicine

## 2017-11-21 VITALS — BP 126/78 | HR 106 | Temp 98.8°F | Resp 16 | Ht 63.0 in | Wt 223.5 lb

## 2017-11-21 DIAGNOSIS — F331 Major depressive disorder, recurrent, moderate: Secondary | ICD-10-CM | POA: Diagnosis not present

## 2017-11-21 DIAGNOSIS — D509 Iron deficiency anemia, unspecified: Secondary | ICD-10-CM

## 2017-11-21 DIAGNOSIS — E785 Hyperlipidemia, unspecified: Secondary | ICD-10-CM | POA: Diagnosis not present

## 2017-11-21 DIAGNOSIS — M810 Age-related osteoporosis without current pathological fracture: Secondary | ICD-10-CM

## 2017-11-21 DIAGNOSIS — E538 Deficiency of other specified B group vitamins: Secondary | ICD-10-CM

## 2017-11-21 DIAGNOSIS — M62830 Muscle spasm of back: Secondary | ICD-10-CM | POA: Diagnosis not present

## 2017-11-21 DIAGNOSIS — F5081 Binge eating disorder: Secondary | ICD-10-CM

## 2017-11-21 MED ORDER — LISDEXAMFETAMINE DIMESYLATE 50 MG PO CAPS: 50.0000 mg | ORAL_CAPSULE | Freq: Every day | ORAL | 0 refills | Status: DC

## 2017-11-21 MED ORDER — FLUOXETINE HCL 40 MG PO CAPS: 40.0000 mg | ORAL_CAPSULE | Freq: Every day | ORAL | 1 refills | Status: DC

## 2017-11-21 MED ORDER — CYCLOBENZAPRINE HCL 10 MG PO TABS: 10.0000 mg | ORAL_TABLET | Freq: Every day | ORAL | 1 refills | Status: DC

## 2017-11-21 MED ORDER — RALOXIFENE HCL 60 MG PO TABS: 60.0000 mg | ORAL_TABLET | Freq: Every day | ORAL | 4 refills | Status: DC

## 2017-11-21 MED ORDER — FERROUS SULFATE 325 (65 FE) MG PO TABS: 325.0000 mg | ORAL_TABLET | Freq: Every day | ORAL | 12 refills | Status: DC

## 2017-11-21 MED ORDER — B-12 1000 MCG SL SUBL: 1.0000 | SUBLINGUAL_TABLET | Freq: Every day | SUBLINGUAL | 12 refills | Status: DC

## 2017-11-21 MED ORDER — ASPIRIN 81 MG PO CHEW: 81.0000 mg | CHEWABLE_TABLET | Freq: Every day | ORAL | 12 refills | Status: DC

## 2017-11-21 NOTE — Progress Notes (Signed)
Name: Terri Castillo   MRN: [id number]    DOB: 01-12-1951   Date:11/21/2017       Progress Note  Subjective  Chief Complaint  Chief Complaint  Patient presents with  . Follow-up    3 mth f/u  . Depression  . Binge Eating Disorder  . Obesity  . insulin resistance  . Osteoporosis  . Medication Refill    HPI  Depression ModerateRecurrent:Rei has failed Prozac in the past ( but wanted to go back since it was the one with the least amount of side effects) also failed Duloxetine,  Wellbutrin and Lexapro, she wasPritiq but not anyimprove symptoms and it was very expensive.She states since Vyvanse was started because of binge eating disorder12/2018-she noticed increase in energy and motivation.   History of bariatric surgery: she had gastric bypass in 2010, her lowest weight was 180 lbs, but wasgradually gained weight since, she has changed her diet recently also states Vyvanse - when she takes it - helps curb her appetite and decreases urge to binge eat. She has lost 5 lbs since the past month .Anemia resolved, hemoccult cards negative. She forgot to get iron supplements but will start today   Bing eating disorder: Started on Vyvanse 10/2016 Weight was 226 lbs, itcurbed her appetite initially , however starting eat more again, we will increased  dose to 50mg  back in April and she also got a refill for May, but has been without for over one month, states when she takes late in the morning it affects her sleep at night. Advised to set alarm and try to take it daily for one month and return for weight check, if she cannot lose about 5 lbs in one month we will stop medication   Insulin Resistance: shehas polyphagia again but deniespolydipsia or polyuria., she is avoiding sweets when she can   B12 deficiency: she states does not want the B12 injection, she states she will try SL B12 and we will recheck labs next visit   Purpura: on arms- stable.   Chronic back  pain: doing well on CBD oil and flexeril at night. Denies side effects. Explained not FDA approved yet   Rhythm change: she noticed her iwatch picked up Afib back in September 2019, discussed referral to cardiologist but she states symptoms sporadic and does not want to be seen at this time, she is now on baby aspirin only .   Patient Active Problem List   Diagnosis Date Noted  . 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 Morbid obesity with BMI of 40.0-44.9, adult (HCC) 02/12/2017  . Insulin resistance 08/11/2016  . Senile purpura (HCC) 08/11/2016  . Facet arthropathy, lumbar 02/02/2016  . Spondylisthesis 01/12/2016  . Chronic bilateral low back pain 11/01/2015  . Iron deficiency anemia 09/04/2014  . Hyperglycemia 09/04/2014  . Perioral dermatitis 09/04/2014  . 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 B12 deficiency 07/02/2014  . Cervical pain 07/02/2014  . Arthralgia of lower leg 07/02/2014  . Insomnia 07/02/2014  . 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 Dyslipidemia 07/02/2014  . Dermatitis, eczematoid 07/02/2014  . Genital herpes in women 07/02/2014  . Gastric reflux 07/02/2014  . Bariatric surgery status 07/02/2014  . Herpes simplex 07/02/2014  . H/O: pneumonia 07/02/2014  . Eczema intertrigo 07/02/2014  . Adult BMI 30+ 07/02/2014  . OP (osteoporosis) 07/02/2014  . 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 Perennial allergic rhinitis with seasonal variation 07/02/2014  . Basal cell papilloma 07/02/2014  . Vitamin D deficiency  07/02/2014    Past Surgical History:  Procedure Laterality Date  . ABDOMINAL HYSTERECTOMY  1994  . CHOLECYSTECTOMY  2000  . EYE SURGERY  2000   Cataract Removal  . EYE SURGERY  2002   Cataract Removal  . GASTRIC BYPASS  2000   Mini  . GASTRIC BYPASS  2010    Family History  Problem Relation Age of Onset  . Dementia Mother   . COPD Mother   . Osteoporosis Mother   . Diabetes Father   . Glaucoma Father   . Parkinson's  disease Father   . Glaucoma Brother   . Heart disease Brother   . Heart Problems Brother   . Brain cancer Daughter        Brain Tumor  . Hypothyroidism Daughter   . Cancer Daughter     Social History   Socioeconomic History  . Marital status: Divorced    Spouse name: Not on file  . Number of children: 2  . Years of education: Not on file  . Highest education level: Some college, no degree  Occupational History  . Not on file  Social Needs  . Financial resource strain: Not hard at all  . Food insecurity:    Worry: Never true    Inability: Never true  . Transportation needs:    Medical: No    Non-medical: No  Tobacco Use  . Smoking status: Former Smoker    Packs/day: 0.50    Years: 5.00    Pack years: 2.50    Types: Cigarettes    Start date: 01/23/1985    Last attempt to quit: 01/23/1990    Years since quitting: 27.8  . Smokeless tobacco: Never Used  Substance and Sexual Activity  . Alcohol use: No    Alcohol/week: 0.0 standard drinks  . Drug use: No  . Sexual activity: Not Currently    Partners: Male  Lifestyle  . Physical activity:    Days per week: 0 days    Minutes per session: 0 min  . Stress: Not at all  Relationships  . Social connections:    Talks on phone: More than three times a week    Gets together: Twice a week    Attends religious service: Never    Active member of club or organization: No    Attends meetings of clubs or organizations: Never    Relationship status: Divorced  . Intimate partner violence:    Fear of current or ex partner: No    Emotionally abused: No    Physically abused: No    Forced sexual activity: No  Other Topics Concern  . Not on file  Social History Narrative  . Not on file     Current Outpatient Medications:  .  acetaminophen (TYLENOL) 500 MG tablet, Take 1 tablet by mouth 4 (four) times daily as needed., Disp: , Rfl:  .  aspirin (ASPIRIN CHILDRENS) 81 MG chewable tablet, Chew 1 tablet (81 mg total) by mouth daily.,  Disp: 30 tablet, Rfl: 12 .  cetirizine (ZYRTEC) 10 MG tablet, Take 10 mg by mouth daily., Disp: , Rfl:  .  Cyanocobalamin (B-12) 1000 MCG SUBL, Place 1 tablet under the tongue daily., Disp: 30 each, Rfl: 12 .  cyclobenzaprine (FLEXERIL) 10 MG tablet, Take 1 tablet (10 mg total) by mouth at bedtime., Disp: 90 tablet, Rfl: 1 .  ferrous sulfate 325 (65 FE) MG tablet, Take 1 tablet (325 mg total) by mouth daily with breakfast., Disp: 30 tablet,  Rfl: 12 .  FLUoxetine (PROZAC) 40 MG capsule, Take 1 capsule (40 mg total) by mouth daily., Disp: 90 capsule, Rfl: 1 .  ketoconazole (NIZORAL) 2 % cream, APPLY TO RASH TWICE DAILY, Disp: 90 g, Rfl: 0 .  lisdexamfetamine (VYVANSE) 50 MG capsule, Take 1 capsule (50 mg total) by mouth daily., Disp: 30 capsule, Rfl: 0 .  lisdexamfetamine (VYVANSE) 50 MG capsule, Take 1 capsule (50 mg total) by mouth daily., Disp: 30 capsule, Rfl: 0 .  lisdexamfetamine (VYVANSE) 50 MG capsule, Take 1 capsule (50 mg total) by mouth daily., Disp: 30 capsule, Rfl: 0 .  PREMARIN vaginal cream, Place 1 Applicatorful vaginally 3 (three) times a week., Disp: 42.5 g, Rfl: 12 .  raloxifene (EVISTA) 60 MG tablet, Take 1 tablet (60 mg total) by mouth daily., Disp: 90 tablet, Rfl: 4 .  triamcinolone cream (KENALOG) 0.5 %, Apply 1 application topically daily as needed., Disp: , Rfl:  .  valACYclovir (VALTREX) 500 MG tablet, Take 1 tablet (500 mg total) by mouth 3 (three) times daily., Disp: 30 tablet, Rfl: 0 .  Vitamin D, Ergocalciferol, (DRISDOL) 50000 units CAPS capsule, TAKE 1 CAPSULE (50,000 UNITS TOTAL) BY MOUTH EVERY 7 (SEVEN) DAYS., Disp: 12 capsule, Rfl: 1  Allergies  Allergen Reactions  . Topamax  [Topiramate]     severe depression  . Trazodone     Other reaction(s): Dizziness    I personally reviewed active problem list, medication list, allergies, family history, social history with the patient/caregiver today.   ROS  Constitutional: Negative for fever , positive for weight  change.  Respiratory: Negative for cough and shortness of breath.   Cardiovascular: Negative for chest pain or palpitations.  Gastrointestinal: Negative for abdominal pain, no bowel changes.  Musculoskeletal: Negative for gait problem or joint swelling.  Skin: Negative for rash.  Neurological: Negative for dizziness or headache.  No other specific complaints in a complete review of systems (except as listed in HPI above).  Objective  Vitals:   11/21/17 1011 11/21/17 1017  BP: 136/84 126/78  Pulse: (!) 106   Resp: 16   Temp: 98.8 F (37.1 C)   TempSrc: Oral   SpO2: 98%   Weight: 223 lb 8 oz (101.4 kg)   Height: 5\' 3"  (1.6 m)     Body mass index is 39.59 kg/m.  Physical Exam  Constitutional: Patient appears well-developed and well-nourished. Obese No distress.  HEENT: head atraumatic, normocephalic, pupils equal and reactive to light,  neck supple, throat within normal limits Cardiovascular: Normal rate, regular rhythm and normal heart sounds.  No murmur heard. No BLE edema. Pulmonary/Chest: Effort normal and breath sounds normal. No respiratory distress. Abdominal: Soft.  There is no tenderness. Psychiatric: Patient has a normal mood and affect. behavior is normal. Judgment and thought content normal.  PHQ2/9: Depression screen [facility] 2/9 11/21/2017 10/11/2017 08/21/2017 05/22/2017 02/12/2017  Decreased Interest 0 0 1 1 1   Down, Depressed, Hopeless 0 0 0 0 0  PHQ - 2 Score 0 0 1 1 1   Altered sleeping 0 0 0 0 0  Tired, decreased energy 3 3 2 2 3   Change in appetite 3 3 3 1 1   Feeling bad or failure about yourself  0 0 0 0 0  Trouble concentrating 0 0 0 0 0  Moving slowly or fidgety/restless 0 0 0 0 0  Suicidal thoughts 0 0 0 0 0  PHQ-9 Score 6 6 6 4 5   Difficult doing work/chores - - Not difficult at  all Not difficult at all Not difficult at all  Some recent data might be hidden    Fall Risk: Fall Risk  11/21/2017 10/11/2017 08/21/2017 05/22/2017 02/12/2017  Falls in the  past year? No No No No No  Number falls in past yr: - - - - -  Injury with Fall? - - - - -     Functional Status Survey: Is the patient deaf or have difficulty hearing?: No Does the patient have difficulty seeing, even when wearing glasses/contacts?: Yes Does the patient have difficulty concentrating, remembering, or making decisions?: No Does the patient have difficulty walking or climbing stairs?: No Does the patient have difficulty dressing or bathing?: No Does the patient have difficulty doing errands alone such as visiting a doctor's office or shopping?: No   Assessment & Plan  1. Iron deficiency anemia, unspecified iron deficiency anemia type  - ferrous sulfate 325 (65 FE) MG tablet; Take 1 tablet (325 mg total) by mouth daily with breakfast.  Dispense: 30 tablet; Refill: 12  2. Spasm of muscle, back  - cyclobenzaprine (FLEXERIL) 10 MG tablet; Take 1 tablet (10 mg total) by mouth at bedtime.  Dispense: 90 tablet; Refill: 1  3. Depression, major, recurrent, moderate (HCC)  - FLUoxetine (PROZAC) 40 MG capsule; Take 1 capsule (40 mg total) by mouth daily.  Dispense: 90 capsule; Refill: 1 - lisdexamfetamine (VYVANSE) 50 MG capsule; Take 1 capsule (50 mg total) by mouth daily.  Dispense: 30 capsule; Refill: 0 - lisdexamfetamine (VYVANSE) 50 MG capsule; Take 1 capsule (50 mg total) by mouth daily.  Dispense: 30 capsule; Refill: 0 - lisdexamfetamine (VYVANSE) 50 MG capsule; Take 1 capsule (50 mg total) by mouth daily.  Dispense: 30 capsule; Refill: 0  4. Osteoporosis without current pathological fracture, unspecified osteoporosis type  - raloxifene (EVISTA) 60 MG tablet; Take 1 tablet (60 mg total) by mouth daily.  Dispense: 90 tablet; Refill: 4  5. Binge eating disorder  - lisdexamfetamine (VYVANSE) 50 MG capsule; Take 1 capsule (50 mg total) by mouth daily.  Dispense: 30 capsule; Refill: 0 - lisdexamfetamine (VYVANSE) 50 MG capsule; Take 1 capsule (50 mg total) by mouth daily.   Dispense: 30 capsule; Refill: 0 - lisdexamfetamine (VYVANSE) 50 MG capsule; Take 1 capsule (50 mg total) by mouth daily.  Dispense: 30 capsule; Refill: 0  6. Dyslipidemia  Not on statin therapy, but needs to consider because of watch picking up afib  7. B12 deficiency  - Cyanocobalamin (B-12) 1000 MCG SUBL; Place 1 tablet under the tongue daily.  Dispense: 30 each; Refill: 12

## 2017-11-21 NOTE — Telephone Encounter (Signed)
Refill request for general medication. Cyclobenzaprine to [facility].   Last office visit: 10/11/2017   Follow up on 11/21/2017

## 2017-11-26 ENCOUNTER — Encounter: Payer: Self-pay | Admitting: Family Medicine

## 2017-11-26 DIAGNOSIS — R002 Palpitations: Secondary | ICD-10-CM

## 2017-12-03 ENCOUNTER — Ambulatory Visit
Admission: RE | Admit: 2017-12-03 | Discharge: 2017-12-03 | Disposition: A | Discharge status: Home / Self Care | Payer: Federal, State, Local not specified - PPO | Source: Ambulatory Visit | Attending: Family Medicine | Admitting: Family Medicine

## 2017-12-03 DIAGNOSIS — Z1231 Encounter for screening mammogram for malignant neoplasm of breast: Secondary | ICD-10-CM

## 2017-12-03 DIAGNOSIS — M810 Age-related osteoporosis without current pathological fracture: Secondary | ICD-10-CM | POA: Insufficient documentation

## 2018-01-22 ENCOUNTER — Encounter: Payer: Self-pay | Admitting: Family Medicine

## 2018-02-04 ENCOUNTER — Ambulatory Visit: Payer: Federal, State, Local not specified - PPO | Admitting: Internal Medicine

## 2018-02-04 ENCOUNTER — Encounter: Payer: Self-pay | Admitting: Internal Medicine

## 2018-02-04 VITALS — BP 122/72 | HR 82 | Ht 62.0 in | Wt 225.0 lb

## 2018-02-04 DIAGNOSIS — R002 Palpitations: Secondary | ICD-10-CM | POA: Diagnosis not present

## 2018-02-04 NOTE — Patient Instructions (Signed)
Medication Instructions:  Your physician recommends that you continue on your current medications as directed. Please refer to the Current Medication list given to you today.  If you need a refill on your cardiac medications before your next appointment, please call your pharmacy.   Lab work: none If you have labs (blood work) drawn today and your tests are completely normal, you will receive your results only by: Marland Kitchen MyChart Message (if you have MyChart) OR . A paper copy in the mail If you have any lab test that is abnormal or we need to change your treatment, we will call you to review the results.  Testing/Procedures: Your physician has recommended that you wear a 48 holter monitor. Holter monitors are medical devices that record the heart's electrical activity. Doctors most often use these monitors to diagnose arrhythmias. Arrhythmias are problems with the speed or rhythm of the heartbeat. The monitor is a small, portable device. You can wear one while you do your normal daily activities. This is usually used to diagnose what is causing palpitations/syncope (passing out).    Follow-Up: At [facility], you and your health needs are our priority.  As part of our continuing mission to provide you with exceptional heart care, we have created designated Provider Care Teams.  These Care Teams include your primary Cardiologist (physician) and Advanced Practice Providers (APPs -  Physician Assistants and Nurse Practitioners) who all work together to provide you with the care you need, when you need it. You will need a follow up appointment in 3 months.  Please call our office 2 months in advance to schedule this appointment.  You may see DR Cristal Deer END or one of the following Advanced Practice Providers on your designated Care Team:   Nicolasa Ducking, NP Eula Listen, PA-C . Marisue Ivan, PA-C   Ambulatory Cardiac Monitoring An ambulatory cardiac monitor is a small recording device  that is used to detect abnormal heart rhythms (arrhythmias). Most monitors are connected by wires to flat, sticky disks (electrodes) that are then attached to your chest. You may need to wear a monitor if you have had symptoms such as:  Fast heartbeats (palpitations).  Dizziness.  Fainting or light-headedness.  Unexplained weakness.  Shortness of breath. There are several types of monitors. Some common monitors include:  Holter monitor. This records your heart rhythm continuously, usually for 24-48 hours.  Event (episodic) monitor. This monitor has a symptoms button, and when pushed, it will begin recording. You need to activate this monitor to record when you have a heart-related symptom.  Automatic detection monitor. This monitor will begin recording when it detects an abnormal heartbeat. What are the risks? Generally, these devices are safe to use. However, it is possible that the skin under the electrodes will become irritated. How to prepare for monitoring Your health care provider will prepare your chest for the electrode placement and show you how to use the monitor.  Do not apply lotions to your chest before monitoring.  Follow directions on how to care for the monitor, and how to return the monitor when the testing period is complete. How to use your cardiac monitor  Follow directions about how long to wear the monitor, and if you can take the monitor off in order to shower or bathe. ? Do not let the monitor get wet. ? Do not bathe, swim, or use a hot tub while wearing the monitor.  Keep your skin clean. Do not put body lotion or moisturizer on your  chest.  Change the electrodes as told by your health care provider, or any time they stop sticking to your skin. You may need to use medical tape to keep them on.  Try to put the electrodes in slightly different places on your chest to help prevent skin irritation. Follow directions from your health care provider about where  to place the electrodes.  Make sure the monitor is safely clipped to your clothing or in a location close to your body as recommended by your health care provider.  If your monitor has a symptoms button, press the button to mark an event as soon as you feel a heart-related symptom, such as: ? Dizziness. ? Weakness. ? Light-headedness. ? Palpitations. ? Thumping or pounding in your chest. ? Shortness of breath. ? Unexplained weakness.  Keep a diary of your activities, such as walking, doing chores, and taking medicine. It is very important to note what you were doing when you pushed the button to record your symptoms. This will help your health care provider determine what might be contributing to your symptoms.  Send the recorded information as recommended by your health care provider. It may take some time for your health care provider to process the results.  Change the batteries as told by your health care provider.  Keep electronic devices away from your monitor. These include: ? Tablets. ? MP3 players. ? Cell phones.  While wearing your monitor you should avoid: ? Electric blankets. ? Firefighter. ? Electric toothbrushes. ? Microwave ovens. ? Magnets. ? Metal detectors. Get help right away if:  You have chest pain.  You have shortness of breath or extreme difficulty breathing.  You develop a very fast heartbeat that does not get better.  You develop dizziness that does not go away.  You faint or constantly feel like you are about to faint. Summary  An ambulatory cardiac monitor is a small recording device that is used to detect abnormal heart rhythms (arrhythmias).  Make sure you understand how to send the information from the monitor to your health care provider.  It is important to press the button on the monitor when you have any heart-related symptoms.  Keep a diary of your activities, such as walking, doing chores, and taking medicine. It is very  important to note what you were doing when you pushed the button to record your symptoms. This will help your health care provider learn what might be causing your symptoms. This information is not intended to replace advice given to you by your health care provider. Make sure you discuss any questions you have with your health care provider. Document Released: 10/19/2007 Document Revised: 10/25/2016 Document Reviewed: 12/25/2015 Elsevier Interactive Patient Education  2019 [employer].

## 2018-02-04 NOTE — Progress Notes (Signed)
New Outpatient Visit Date: 02/04/2018  Referring Provider: Alba CorySowles, Krichna, MD [address] [place], [place] [id number]  Chief Complaint: Palpitations  HPI:  Ms. Terri Castillo is a 68 y.o. female who is being seen today for the evaluation of palpitations at the request of Dr. Carlynn PurlSowles. She has a history of morbid obesity status post gastric bypass, impaired glucose tolerance, and iron deficiency anemia.  In September, she saw Dr. Carlynn PurlSowles and reported occasional fluttering in her chest.  She noted that her Apple Watch s indicated an episode of atrial fibrillation in September.  Today, Ms. Poteete reports that she is feeling well.  However, she notes that she has experienced palpitations described as a brief fluttering lasting a few seconds at a time for most of her life.  It is most pronounced when she lies on her right side at night, though it can occur at other times as well.  She denies accompanying symptoms.  She has not experienced any chest pain, lightheadedness, orthopnea, and PND.  She has chronic exertional dyspnea with moderate and strenuous activity, which she attributes to obesity and deconditioning.  This has been stable.  She also notes occasional dependent edema.  This was most pronounced during a trip to [place].  Ms. Terri Castillo reports having had a stress test at least 20 years ago in anticipation of bariatric surgery.  She reports this was normal.  She has otherwise not had any cardiovascular testing.  She consumes 24 ounces of coffee per day (half-caff) as well as occasional tea.  She also uses Vyvanse on an as-needed basis to help with energy and appetite suppression.  --------------------------------------------------------------------------------------------------  Cardiovascular History & Procedures: Cardiovascular Problems:  Palpitations  Risk Factors:  Morbid obesity age greater than 465, and impaired glucose tolerance  Cath/PCI:  None  CV Surgery:  None  EP  Procedures and Devices:  None  Non-Invasive Evaluation(s):  None available  Recent CV Pertinent Labs: Lab Results  Component Value Date   CHOL 181 08/15/2017   CHOL 181 07/05/2015   HDL 50 (L) 08/15/2017   HDL 45 07/05/2015   LDLCALC 104 (H) 08/15/2017   TRIG 155 (H) 08/15/2017   CHOLHDL 3.6 08/15/2017   K 3.6 08/15/2017   BUN 8 08/15/2017   BUN 10 07/05/2015   CREATININE 0.74 08/15/2017    --------------------------------------------------------------------------------------------------  Past Medical History:  Diagnosis Date  . Chronic back pain   . Depression   . Insomnia   . Obesity     Past Surgical History:  Procedure Laterality Date  . ABDOMINAL HYSTERECTOMY  1994  . CHOLECYSTECTOMY  2000  . EYE SURGERY  2000   Cataract Removal  . EYE SURGERY  2002   Cataract Removal  . GASTRIC BYPASS  2000   Mini  . GASTRIC BYPASS  2010    Current Meds  Medication Sig  . acetaminophen (TYLENOL) 500 MG tablet Take 1 tablet by mouth 4 (four) times daily as needed.  Marland Kitchen. aspirin (ASPIRIN CHILDRENS) 81 MG chewable tablet Chew 1 tablet (81 mg total) by mouth daily.  . Cyanocobalamin (B-12) 1000 MCG SUBL Place 1 tablet under the tongue daily.  Marland Kitchen. FLUoxetine (PROZAC) 40 MG capsule Take 1 capsule (40 mg total) by mouth daily.  Marland Kitchen. PREMARIN vaginal cream Place 1 Applicatorful vaginally 3 (three) times a week.  . raloxifene (EVISTA) 60 MG tablet Take 1 tablet (60 mg total) by mouth daily.    Allergies: Topamax  [topiramate] and Trazodone  Social History   Tobacco  Use  . Smoking status: Former Smoker    Packs/day: 0.50    Years: 5.00    Pack years: 2.50    Types: Cigarettes    Start date: 01/23/1985    Last attempt to quit: 1992    Years since quitting: 28.0  . Smokeless tobacco: Never Used  Substance Use Topics  . Alcohol use: Not Currently    Comment: 3 drinks/year  . Drug use: No    Family History  Problem Relation Age of Onset  . Dementia Mother   . COPD Mother    . Osteoporosis Mother   . Diabetes Father   . Glaucoma Father   . Parkinson's disease Father   . Glaucoma Brother   . Heart Problems Brother   . Heart attack Brother 52  . Brain cancer Daughter        Brain Tumor  . Hypothyroidism Daughter   . Cancer Daughter   . Heart disease Maternal Aunt   . Heart disease Maternal Uncle     Review of Systems: A 12-system review of systems was performed and was negative except as noted in the HPI.  --------------------------------------------------------------------------------------------------  Physical Exam: BP 122/72 (BP Location: Right Arm, Patient Position: Sitting, Cuff Size: Large)   Pulse 82   Ht 5\' 2"  (1.575 m)   Wt 225 lb (102.1 kg)   BMI 41.15 kg/m   General: NAD. HEENT: No conjunctival pallor or scleral icterus. Moist mucous membranes. OP clear. Neck: Supple without lymphadenopathy, thyromegaly, JVD, or HJR, though evaluation is limited by body habitus.. No carotid bruit. Lungs: Normal work of breathing. Clear to auscultation bilaterally without wheezes or crackles. Heart: Regular rate and rhythm without murmurs, rubs, or gallops.  Unable to assess PMI due to body habitus. Abd: Bowel sounds present. Soft, NT/ND.  Unable to assess HSM due to body habitus. Ext: No lower extremity edema. Radial, PT, and DP pulses are 2+ bilaterally Skin: Warm and dry without rash. Neuro: CNIII-XII intact. Strength and fine-touch sensation intact in upper and lower extremities bilaterally. Psych: Normal mood and affect.  EKG: Normal sinus rhythm without abnormality  Lab Results  Component Value Date   WBC 5.6 08/15/2017   HGB 13.8 08/15/2017   HCT 40.8 08/15/2017   MCV 90.1 08/15/2017   PLT 349 08/15/2017    Lab Results  Component Value Date   NA 140 08/15/2017   K 3.6 08/15/2017   CL 104 08/15/2017   CO2 26 08/15/2017   BUN 8 08/15/2017   CREATININE 0.74 08/15/2017   GLUCOSE 96 08/15/2017   ALT 21 08/15/2017    Lab Results    Component Value Date   CHOL 181 08/15/2017   HDL 50 (L) 08/15/2017   LDLCALC 104 (H) 08/15/2017   TRIG 155 (H) 08/15/2017   CHOLHDL 3.6 08/15/2017     --------------------------------------------------------------------------------------------------  ASSESSMENT AND PLAN: Palpitations Symptoms are most consistent with PACs or PVCs.  I have personally reviewed a single tracing on her Apple Watch labeled as atrial fibrillation from 09/2017.  Tracing is most consistent with sinus rhythm with PACs and atrial runs.  I do not think that it is diagnostic for a-fib.  Given that palpitations occur on an almost daily basis, we have agreed to obtain a 48-hour Holter monitor for further characterization.  I have encouraged Ms. Santistevan to minimize her caffeine intake and to minimize Vyvanse use.  She is reluctant to add additional medications at this time, which I agree with.  Based on results of  the Holter monitor, addition of low-dose beta-blocker could be considered if she remains symptomatic.  If arrhythmia is identified, further assessment for obstructive sleep apnea may also be beneficial.  I think it is reasonable for Ms. Leatherbury to forego standing aspirin use.  Morbid obesity I have encouraged Ms. Runyan to lose weight through diet and exercise.  Follow-up: Return to clinic in 3 months.  Yvonne Kendallhristopher Lennex Pietila, MD 02/04/2018 2:31 PM

## 2018-02-20 ENCOUNTER — Ambulatory Visit (INDEPENDENT_AMBULATORY_CARE_PROVIDER_SITE_OTHER): Payer: Federal, State, Local not specified - PPO

## 2018-02-20 DIAGNOSIS — R002 Palpitations: Secondary | ICD-10-CM | POA: Diagnosis not present

## 2018-02-22 ENCOUNTER — Ambulatory Visit: Payer: Federal, State, Local not specified - PPO | Admitting: Family Medicine

## 2018-02-22 ENCOUNTER — Encounter: Payer: Self-pay | Admitting: Family Medicine

## 2018-02-22 VITALS — BP 140/90 | HR 90 | Temp 97.9°F | Resp 16 | Ht 62.0 in | Wt 223.8 lb

## 2018-02-22 DIAGNOSIS — Z6841 Body Mass Index (BMI) 40.0 and over, adult: Secondary | ICD-10-CM

## 2018-02-22 DIAGNOSIS — D692 Other nonthrombocytopenic purpura: Secondary | ICD-10-CM | POA: Diagnosis not present

## 2018-02-22 DIAGNOSIS — N939 Abnormal uterine and vaginal bleeding, unspecified: Secondary | ICD-10-CM

## 2018-02-22 DIAGNOSIS — M62830 Muscle spasm of back: Secondary | ICD-10-CM

## 2018-02-22 DIAGNOSIS — Z23 Encounter for immunization: Secondary | ICD-10-CM | POA: Diagnosis not present

## 2018-02-22 DIAGNOSIS — F330 Major depressive disorder, recurrent, mild: Secondary | ICD-10-CM | POA: Diagnosis not present

## 2018-02-22 DIAGNOSIS — M810 Age-related osteoporosis without current pathological fracture: Secondary | ICD-10-CM

## 2018-02-22 DIAGNOSIS — M4696 Unspecified inflammatory spondylopathy, lumbar region: Secondary | ICD-10-CM

## 2018-02-22 DIAGNOSIS — E538 Deficiency of other specified B group vitamins: Secondary | ICD-10-CM

## 2018-02-22 DIAGNOSIS — N951 Menopausal and female climacteric states: Secondary | ICD-10-CM

## 2018-02-22 DIAGNOSIS — Z9884 Bariatric surgery status: Secondary | ICD-10-CM

## 2018-02-22 MED ORDER — CYCLOBENZAPRINE HCL 10 MG PO TABS: 10.0000 mg | ORAL_TABLET | Freq: Every day | ORAL | 1 refills | Status: DC

## 2018-02-22 MED ORDER — PREMARIN 0.625 MG/GM VA CREA: 1.0000 | TOPICAL_CREAM | VAGINAL | 12 refills | Status: DC

## 2018-02-22 MED ORDER — ZOSTER VAC RECOMB ADJUVANTED 50 MCG/0.5ML IM SUSR: 0.5000 mL | Freq: Once | INTRAMUSCULAR | 1 refills | Status: AC

## 2018-02-22 NOTE — Progress Notes (Signed)
Name: Terri Castillo   MRN: [id number]    DOB: Sep 21, 1950   Date:02/22/2018       Progress Note  Subjective  Chief Complaint  Chief Complaint  Patient presents with  . Medication Refill  . Depression  . Binge Eating Disorder  . Chronic Back Pain  . History of bariatric surgery  . Vaginal Bleeding    Wednesday had bleeding and started to use premarin cream again    HPI   Depression Mild Recurrent:Trianna has failed Prozac in the past ( but wanted to go back since it was the one with the least amount of side effects) also failed Duloxetine,Wellbutrin and Lexapro, she wasPritiq but not anyimprove symptoms and it was very expensive.She states since Vyvanse was started because of binge eating disorder12/2018-she noticed increase in energy and motivation. , she ran out of Prozac in January and states felt even better and has been off SSRI since.   History of bariatric surgery: she had gastric bypass in 2010, her lowest weight was 180 lbs, but wasgradually gained weight since, she has changed her diet recently also states Vyvanse - when she takes it - helps curb her appetite and decreases urge to binge eat. Weight is stable .Anemia resolved, hemoccult cards negative.   Bing eating disorder: Started on Vyvanse 10/2016 Weight was 226 lbs, itcurbed her appetite initially , however starting eat more again, we will increaseddose to 50mg back in April , however she is not taking it consistently because it causes difficulty sleeping at times. Weight is stable.   Insulin Resistance: shehas polyphagia again but deniespolydipsia or polyuria.Being more careful about her diet again.   B12 deficiency: she states does not want the B12 injection, she states she will try SL B12 she wants to wait until July to recheck labs  Purpura: better since she stopped using aspirin   Chronic back pain: doing well on CBD oil and flexeril at night. Denies side effects. Explained not FDA  approved yet . She still had daily intermittent pain  Rhythm change: she noticed her iwatch picked up Afib back in September 2019, discussed referral to cardiologist , she saw Dr. Okey DupreEnd and had a holter monitor but results not back yet   Vaginal atrophy  and recent bleeding: she had similar episode in the past, s/p hysterectomy , and was using premarin for vaginal atrophy however had been off medication since the week after she started medication,  and noticed bleeding when she wiped a few weeks ago. One episode only, no dysuria, or fever, no rectal bleeding. Advised pelvic exam but she refused today, states did not shower but will return if it happens again    Patient Active Problem List   Diagnosis Date Noted  . Palpitations 02/04/2018  . Morbid obesity (HCC) 02/04/2018  . 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 Morbid obesity with BMI of 40.0-44.9, adult (HCC) 02/12/2017  . Insulin resistance 08/11/2016  . Senile purpura (HCC) 08/11/2016  . Facet arthropathy, lumbar 02/02/2016  . Spondylisthesis 01/12/2016  . Chronic bilateral low back pain 11/01/2015  . Iron deficiency anemia 09/04/2014  . Hyperglycemia 09/04/2014  . Perioral dermatitis 09/04/2014  . 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 B12 deficiency 07/02/2014  . Cervical pain 07/02/2014  . Arthralgia of lower leg 07/02/2014  . Insomnia 07/02/2014  . 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 Dyslipidemia 07/02/2014  . Dermatitis, eczematoid 07/02/2014  . Genital herpes in women 07/02/2014  . Gastric reflux 07/02/2014  . Bariatric  surgery status 07/02/2014  . Herpes simplex 07/02/2014  . H/O: pneumonia 07/02/2014  . Eczema intertrigo 07/02/2014  . Adult BMI 30+ 07/02/2014  . OP (osteoporosis) 07/02/2014  . 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 Perennial allergic rhinitis with seasonal variation 07/02/2014  . Basal cell papilloma 07/02/2014  . Vitamin D deficiency 07/02/2014    Past Surgical History:   Procedure Laterality Date  . ABDOMINAL HYSTERECTOMY  1994  . CHOLECYSTECTOMY  2000  . EYE SURGERY  2000   Cataract Removal  . EYE SURGERY  2002   Cataract Removal  . GASTRIC BYPASS  2000   Mini  . GASTRIC BYPASS  2010    Family History  Problem Relation Age of Onset  . Dementia Mother   . COPD Mother   . Osteoporosis Mother   . Diabetes Father   . Glaucoma Father   . Parkinson's disease Father   . Glaucoma Brother   . Heart Problems Brother   . Heart attack Brother 52  . Brain cancer Daughter        Brain Tumor  . Hypothyroidism Daughter   . Cancer Daughter   . Heart disease Maternal Aunt   . Heart disease Maternal Uncle     Social History   Socioeconomic History  . Marital status: Divorced    Spouse name: Not on file  . Number of children: 2  . Years of education: Not on file  . Highest education level: Some college, no degree  Occupational History  . Not on file  Social Needs  . Financial resource strain: Not hard at all  . Food insecurity:    Worry: Never true    Inability: Never true  . Transportation needs:    Medical: No    Non-medical: No  Tobacco Use  . Smoking status: Former Smoker    Packs/day: 0.50    Years: 5.00    Pack years: 2.50    Types: Cigarettes    Start date: 01/23/1985    Last attempt to quit: 1992    Years since quitting: 28.1  . Smokeless tobacco: Never Used  Substance and Sexual Activity  . Alcohol use: Not Currently    Comment: 3 drinks/year  . Drug use: No  . Sexual activity: Not Currently    Partners: Male  Lifestyle  . Physical activity:    Days per week: 0 days    Minutes per session: 0 min  . Stress: Not at all  Relationships  . Social connections:    Talks on phone: More than three times a week    Gets together: Twice a week    Attends religious service: Never    Active member of club or organization: No    Attends meetings of clubs or organizations: Never    Relationship status: Divorced  . Intimate partner  violence:    Fear of current or ex partner: No    Emotionally abused: No    Physically abused: No    Forced sexual activity: No  Other Topics Concern  . Not on file  Social History Narrative  . Not on file     Current Outpatient Medications:  .  acetaminophen (TYLENOL) 500 MG tablet, Take 1 tablet by mouth 4 (four) times daily as needed., Disp: , Rfl:  .  Cyanocobalamin (B-12) 1000 MCG SUBL, Place 1 tablet under the tongue daily., Disp: 30 each, Rfl: 12 .  cyclobenzaprine (FLEXERIL) 10 MG tablet, Take 1 tablet (10 mg total) by mouth at bedtime., Disp:  90 tablet, Rfl: 1 .  ketoconazole (NIZORAL) 2 % cream, APPLY TO RASH TWICE DAILY, Disp: 90 g, Rfl: 0 .  lisdexamfetamine (VYVANSE) 50 MG capsule, Take 1 capsule (50 mg total) by mouth daily., Disp: 30 capsule, Rfl: 0 .  lisdexamfetamine (VYVANSE) 50 MG capsule, Take 1 capsule (50 mg total) by mouth daily., Disp: 30 capsule, Rfl: 0 .  lisdexamfetamine (VYVANSE) 50 MG capsule, Take 1 capsule (50 mg total) by mouth daily., Disp: 30 capsule, Rfl: 0 .  PREMARIN vaginal cream, Place 1 Applicatorful vaginally 3 (three) times a week., Disp: 42.5 g, Rfl: 12 .  raloxifene (EVISTA) 60 MG tablet, Take 1 tablet (60 mg total) by mouth daily., Disp: 90 tablet, Rfl: 4 .  valACYclovir (VALTREX) 500 MG tablet, Take 1 tablet (500 mg total) by mouth 3 (three) times daily., Disp: 30 tablet, Rfl: 0 .  cetirizine (ZYRTEC) 10 MG tablet, Take 10 mg by mouth daily., Disp: , Rfl:  .  ferrous sulfate 325 (65 FE) MG tablet, Take 1 tablet (325 mg total) by mouth daily with breakfast. (Patient not taking: Reported on 02/04/2018), Disp: 30 tablet, Rfl: 12  Allergies  Allergen Reactions  . Topamax  [Topiramate]     severe depression  . Trazodone     Other reaction(s): Dizziness    I personally reviewed active problem list, medication list, allergies, family history, social history with the patient/caregiver today.   ROS  Constitutional: Negative for fever or  weight change.  Respiratory: Negative for cough and shortness of breath.   Cardiovascular: Negative for chest pain or palpitations.  Gastrointestinal: Negative for abdominal pain, no bowel changes.  Musculoskeletal: Negative for gait problem or joint swelling.  Skin: Negative for rash.  Neurological: Negative for dizziness or headache.  No other specific complaints in a complete review of systems (except as listed in HPI above).  Objective  Vitals:   02/22/18 1019  BP: 134/82  Pulse: 90  Resp: 16  Temp: 97.9 F (36.6 C)  TempSrc: Oral  SpO2: 99%  Weight: 223 lb 12.8 oz (101.5 kg)  Height: 5\' 2"  (1.575 m)    Body mass index is 40.93 kg/m.  Physical Exam  Constitutional: Patient appears well-developed and well-nourished. Obese  No distress.  HEENT: head atraumatic, normocephalic, pupils equal and reactive to light, neck supple, throat within normal limits Cardiovascular: Normal rate, regular rhythm and normal heart sounds.  No murmur heard. No BLE edema. Pulmonary/Chest: Effort normal and breath sounds normal. No respiratory distress. Muscular skeletal: pain during palpation of lumbar spine  Abdominal: Soft.  There is no tenderness. Psychiatric: Patient has a normal mood and affect. behavior is normal. Judgment and thought content normal.  PHQ2/9: Depression screen [facility] 2/9 02/22/2018 11/21/2017 10/11/2017 08/21/2017 05/22/2017  Decreased Interest 0 0 0 1 1  Down, Depressed, Hopeless 0 0 0 0 0  PHQ - 2 Score 0 0 0 1 1  Altered sleeping 0 0 0 0 0  Tired, decreased energy 1 3 3 2 2   Change in appetite 1 3 3 3 1   Feeling bad or failure about yourself  0 0 0 0 0  Trouble concentrating 0 0 0 0 0  Moving slowly or fidgety/restless 0 0 0 0 0  Suicidal thoughts 0 0 0 0 0  PHQ-9 Score 2 6 6 6 4   Difficult doing work/chores Not difficult at all - - Not difficult at all Not difficult at all  Some recent data might be hidden  Fall Risk: Fall Risk  02/22/2018 11/21/2017  10/11/2017 08/21/2017 05/22/2017  Falls in the past year? 0 No No No No  Number falls in past yr: - - - - -  Injury with Fall? - - - - -     Functional Status Survey: Is the patient deaf or have difficulty hearing?: No Does the patient have difficulty seeing, even when wearing glasses/contacts?: Yes Does the patient have difficulty concentrating, remembering, or making decisions?: No Does the patient have difficulty walking or climbing stairs?: No Does the patient have difficulty dressing or bathing?: No Does the patient have difficulty doing errands alone such as visiting a doctor's office or shopping?: No    Assessment & Plan  1. Depression, major, recurrent, mild (HCC)  She is off Prozac, feeling better, does not want to resume medication  2. Inflammatory spondylopathy of lumbar region [facility])  She still has daily pain, but stable at this time  3. Senile purpura (HCC)  Stable   4. Spasm of muscle, back  - cyclobenzaprine (FLEXERIL) 10 MG tablet; Take 1 tablet (10 mg total) by mouth at bedtime.  Dispense: 90 tablet; Refill: 1  5. Bariatric surgery status  Stable weight through the holidays   6. Osteoporosis without current pathological fracture, unspecified osteoporosis type  Finished Forteo and is now on Evista   7. B12 deficiency  Taking B12 supplements   8. Vaginal dryness, menopausal  - PREMARIN vaginal cream; Place 1 Applicatorful vaginally 3 (three) times a week.  Dispense: 42.5 g; Refill: 12  9. Vaginal bleeding  Similar symptoms in the past, that resolved with premarin, she does not have an uterus, advised pelvic exam but she wants to hold off for now   10. Morbid obesity with BMI of 40.0-44.9, adult [facility])  Discussed with the patient the risk posed by an increased BMI. Discussed importance of portion control, calorie counting and at least 150 minutes of physical activity weekly. Avoid sweet beverages and drink more water. Eat at least 6 servings of fruit  and vegetables daily    11. Need for vaccination for pneumococcus  - Pneumococcal polysaccharide vaccine 23-valent greater than or equal to 2yo subcutaneous/IM  12. Need for shingles vaccine  - Zoster Vaccine Adjuvanted [facility]) injection; Inject 0.5 mLs into the muscle once for 1 dose.  Dispense: 0.5 mL; Refill: 1

## 2018-02-26 ENCOUNTER — Ambulatory Visit
Admission: RE | Admit: 2018-02-26 | Discharge: 2018-02-26 | Disposition: A | Discharge status: Home / Self Care | Payer: Federal, State, Local not specified - PPO | Source: Ambulatory Visit | Attending: Internal Medicine | Admitting: Internal Medicine

## 2018-02-26 ENCOUNTER — Telehealth: Payer: Self-pay | Admitting: Internal Medicine

## 2018-02-26 DIAGNOSIS — R002 Palpitations: Secondary | ICD-10-CM | POA: Insufficient documentation

## 2018-02-26 NOTE — Telephone Encounter (Signed)
Waiting for monitor to be uploaded and interpreted by provider. Patient notified and was appreciative.

## 2018-02-26 NOTE — Telephone Encounter (Signed)
Patient calling  Would like to check the status of the heart monitor results  Please call to discuss

## 2018-03-02 ENCOUNTER — Encounter: Payer: Self-pay | Admitting: Family Medicine

## 2018-03-04 ENCOUNTER — Other Ambulatory Visit: Payer: Self-pay | Admitting: Family Medicine

## 2018-05-01 ENCOUNTER — Telehealth: Payer: Self-pay

## 2018-05-01 NOTE — Telephone Encounter (Signed)
Virtual Visit Pre-Appointment Phone Call  Steps For Call:  1. Confirm consent - "In the setting of the current Covid19 crisis, you are scheduled for a phone visit with your provider on May 03, 2018 at 10:30AM.  Just as we do with many in-office visits, in order for you to participate in this visit, we must obtain consent.  If you'd like, I can send this to your mychart (if signed up) or email for you to review.  Otherwise, I can obtain your verbal consent now.  All virtual visits are billed to your insurance company just like a normal visit would be.  By agreeing to a virtual visit, we'd like you to understand that the technology does not allow for your provider to perform an examination, and thus may limit your provider's ability to fully assess your condition.  Finally, though the technology is pretty good, we cannot assure that it will always work on either your or our end, and in the setting of a video visit, we may have to convert it to a phone-only visit.  In either situation, we cannot ensure that we have a secure connection.  Are you willing to proceed?"  2. Give patient instructions for WebEx download to smartphone as below if video visit  3. Advise patient to be prepared with any vital sign or heart rhythm information, their current medicines, and a piece of paper and pen handy for any instructions they may receive the day of their visit  4. Inform patient they will receive a phone call 15 minutes prior to their appointment time (may be from unknown caller ID) so they should be prepared to answer  5. Confirm that appointment type is correct in Epic appointment notes (video vs telephone)    TELEPHONE CALL NOTE  Terri Castillo has been deemed a candidate for a follow-up tele-health visit to limit community exposure during the Covid-19 pandemic. I spoke with the patient via phone to ensure availability of phone/video source, confirm preferred email & phone number, and discuss  instructions and expectations.  I reminded Terri Castillo to be prepared with any vital sign and/or heart rhythm information that could potentially be obtained via home monitoring, at the time of her visit. I reminded Terri Castillo to expect a phone call at the time of her visit if her visit.  Did the patient verbally acknowledge consent to treatment? YES  Andi Devon 05/01/2018 12:34 PM  CONSENT FOR TELE-HEALTH VISIT - PLEASE REVIEW  I hereby voluntarily request, consent and authorize [facility] and its employed or contracted physicians, physician assistants, nurse practitioners or other licensed health care professionals (the Practitioner), to provide me with telemedicine health care services (the Services") as deemed necessary by the treating Practitioner. I acknowledge and consent to receive the Services by the Practitioner via telemedicine. I understand that the telemedicine visit will involve communicating with the Practitioner through live audiovisual communication technology and the disclosure of certain medical information by electronic transmission. I acknowledge that I have been given the opportunity to request an in-person assessment or other available alternative prior to the telemedicine visit and am voluntarily participating in the telemedicine visit.  I understand that I have the right to withhold or withdraw my consent to the use of telemedicine in the course of my care at any time, without affecting my right to future care or treatment, and that the Practitioner or I may terminate the telemedicine visit at any time. I understand that  I have the right to inspect all information obtained and/or recorded in the course of the telemedicine visit and may receive copies of available information for a reasonable fee.  I understand that some of the potential risks of receiving the Services via telemedicine include:   Delay or interruption in medical evaluation due to  technological equipment failure or disruption;  Information transmitted may not be sufficient (e.g. poor resolution of images) to allow for appropriate medical decision making by the Practitioner; and/or   In rare instances, security protocols could fail, causing a breach of personal health information.  Furthermore, I acknowledge that it is my responsibility to provide information about my medical history, conditions and care that is complete and accurate to the best of my ability. I acknowledge that Practitioner's advice, recommendations, and/or decision may be based on factors not within their control, such as incomplete or inaccurate data provided by me or distortions of diagnostic images or specimens that may result from electronic transmissions. I understand that the practice of medicine is not an exact science and that Practitioner makes no warranties or guarantees regarding treatment outcomes. I acknowledge that I will receive a copy of this consent concurrently upon execution via email to the email address I last provided but may also request a printed copy by calling the office of [place].    I understand that my insurance will be billed for this visit.   I have read or had this consent read to me.  I understand the contents of this consent, which adequately explains the benefits and risks of the Services being provided via telemedicine.   I have been provided ample opportunity to ask questions regarding this consent and the Services and have had my questions answered to my satisfaction.  I give my informed consent for the services to be provided through the use of telemedicine in my medical care  By participating in this telemedicine visit I agree to the above.

## 2018-05-02 NOTE — Progress Notes (Signed)
Virtual Visit via Telephone Note   This visit type was conducted due to national recommendations for restrictions regarding the COVID-19 Pandemic (e.g. social distancing) in an effort to limit this patient's exposure and mitigate transmission in our community.  Due to her co-morbid illnesses, this patient is at least at moderate risk for complications without adequate follow up.  This format is felt to be most appropriate for this patient at this time.  The patient did not have access to video technology/had technical difficulties with video requiring transitioning to audio format only (telephone).  All issues noted in this document were discussed and addressed.  No physical exam could be performed with this format.  Please refer to the patient's chart for her  consent to telehealth for Terri Castillo.   Evaluation Performed:  Follow-up visit  Date:  05/03/2018   ID:  Terri Castillo, DOB Apr 07, 1950, MRN [id number]  Patient Location: Home  Provider Location: Office  PCP:  Steele Sizer, MD  Cardiologist:  Nelva Bush, MD Electrophysiologist:  None   Chief Complaint: Follow-up palpitations  History of Present Illness:    Terri Castillo is a 68 y.o. female who presents via audio/video conferencing for a telehealth visit today.  She has a history of morbid obesity s/p gastric bypass, impaired glucose tolerance, and iron deficiency anemia.  I met her in January for evaluation of palpitations.  She had been experiencing occasional fluttering in her chest and noticed that her Apple Watch indicated and episode of atrial fibrillation last September.  However, review of the strip from her watch was most consistent with sinus rhythm with PAC's and brief atrial runs.  Other than chronic dyspnea on exertion, she was feeling well.  We obtained a 48-hour Holter monitor, which confirmed predominantly sinus rhtyhm with rare PAC's, PVC's, and brief atrial runs.  Terri Castillo declined addition of a  beta-blocker at that time.  Today, Terri Castillo reports that her palpitations are stable or slightly better.  She no longer has the "flip flop" sensation in her chest.  She still feels her heart skip a beat or race for a second or 2 at times.  Symptoms are not bothersome.  She denies chest pain, shortness of breath, lightheadedness, and edema.  She continues to use Vyvanse intermittently.  She has been trying to lose weight with dietary modifications.  She does not exercise regularly.  The patient does not have symptoms concerning for COVID-19 infection (fever, chills, cough, or new shortness of breath).    Past Medical History:  Diagnosis Date   Chronic back pain    Depression    Insomnia    Obesity    Past Surgical History:  Procedure Laterality Date   ABDOMINAL HYSTERECTOMY  1994   CHOLECYSTECTOMY  2000   EYE SURGERY  2000   Cataract Removal   EYE SURGERY  2002   Cataract Removal   GASTRIC BYPASS  2000   Mini   GASTRIC BYPASS  2010     Current Meds  Medication Sig   Calcium Carb-Cholecalciferol (CALCIUM/VITAMIN D PO) Take by mouth daily.   cetirizine (ZYRTEC) 10 MG tablet Take 10 mg by mouth daily as needed.    cyclobenzaprine (FLEXERIL) 10 MG tablet Take 1 tablet (10 mg total) by mouth at bedtime.   ferrous sulfate 325 (65 FE) MG tablet Take 1 tablet (325 mg total) by mouth daily with breakfast.   ketoconazole (NIZORAL) 2 % cream APPLY TO RASH TWICE DAILY (Patient taking differently: APPLY TO  RASH TWICE DAILY PRN)   lisdexamfetamine (VYVANSE) 50 MG capsule Take 1 capsule (50 mg total) by mouth daily. (Patient taking differently: Take 50 mg by mouth daily as needed. )   PREMARIN vaginal cream Place 1 Applicatorful vaginally 3 (three) times a week. (Patient taking differently: Place 1 Applicatorful vaginally as needed. )   raloxifene (EVISTA) 60 MG tablet Take 1 tablet (60 mg total) by mouth daily.   valACYclovir (VALTREX) 500 MG tablet Take 1 tablet (500 mg  total) by mouth 3 (three) times daily.     Allergies:   Topamax  [topiramate] and Trazodone   Social History   Tobacco Use   Smoking status: Former Smoker    Packs/day: 0.50    Years: 5.00    Pack years: 2.50    Types: Cigarettes    Start date: 01/23/1985    Last attempt to quit: 1992    Years since quitting: 28.2   Smokeless tobacco: Never Used  Substance Use Topics   Alcohol use: Not Currently    Comment: 3 drinks/year   Drug use: No     Family Hx: The patient's family history includes Brain cancer in her daughter; COPD in her mother; Cancer in her daughter; Dementia in her mother; Diabetes in her father; Glaucoma in her brother and father; Heart Problems in her brother; Heart attack (age of onset: 31) in her brother; Heart disease in her maternal aunt and maternal uncle; Hypothyroidism in her daughter; Osteoporosis in her mother; Parkinson's disease in her father.  ROS:   Please see the history of present illness.   All other systems reviewed and are negative.   Prior CV studies:   The following studies were reviewed today:  48-hour Holter monitor (02/20/2018):  Predominantly sinus rhythm with rare PACs and PVCs, as well as brief atrial runs lasting up to 4 beats.  Labs/Other Tests and Data Reviewed:    EKG:  No ECG reviewed.  Recent Labs: 08/15/2017: ALT 21; BUN 8; Creat 0.74; Hemoglobin 13.8; Platelets 349; Potassium 3.6; Sodium 140; TSH 5.03   Recent Lipid Panel Lab Results  Component Value Date/Time   CHOL 181 08/15/2017 08:36 AM   CHOL 181 07/05/2015 09:59 AM   TRIG 155 (H) 08/15/2017 08:36 AM   HDL 50 (L) 08/15/2017 08:36 AM   HDL 45 07/05/2015 09:59 AM   CHOLHDL 3.6 08/15/2017 08:36 AM   LDLCALC 104 (H) 08/15/2017 08:36 AM    Wt Readings from Last 3 Encounters:  05/03/18 218 lb (98.9 kg)  02/22/18 223 lb 12.8 oz (101.5 kg)  02/04/18 225 lb (102.1 kg)     Objective:    Vital Signs:  Ht '5\' 2"'  (1.575 m)    Wt 218 lb (98.9 kg)    BMI 39.87 kg/m     ASSESSMENT & PLAN:    Palpitations with PACs, PVCs, and PSVT: Symptoms stable to improved.  No sustained arrhythmia noted on Holter monitor.  Symptoms are consistent with PACs and PVCs, as well as brief atrial runs.  Patient declines pharmacotherapy at this time, which I think is reasonable.  I encouraged her to minimize caffeine use as well as to use other stimulants like Vyvanse as little as possible.  She should contact us if her symptoms worsen to reconsider addition of a beta-blocker.  No further testing or intervention planned at this time.  COVID-19 Education: The signs and symptoms of COVID-19 were discussed with the patient and how to seek care for testing (follow up with PCP  or arrange E-visit).  The importance of social distancing was discussed today.  Time:   Today, I have spent 6 minutes with the patient with telehealth technology discussing the above problems.     Medication Adjustments/Labs and Tests Ordered: Current medicines are reviewed at length with the patient today.  Concerns regarding medicines are outlined above.   Tests Ordered: None.  Medication Changes: None.  Disposition:  Follow up prn  Signed, Nelva Bush, MD  05/03/2018 10:24 AM    [place]

## 2018-05-03 ENCOUNTER — Encounter: Payer: Self-pay | Admitting: Internal Medicine

## 2018-05-03 ENCOUNTER — Other Ambulatory Visit: Payer: Self-pay

## 2018-05-03 ENCOUNTER — Telehealth (INDEPENDENT_AMBULATORY_CARE_PROVIDER_SITE_OTHER): Payer: Federal, State, Local not specified - PPO | Admitting: Internal Medicine

## 2018-05-03 VITALS — Ht 62.0 in | Wt 218.0 lb

## 2018-05-03 DIAGNOSIS — I493 Ventricular premature depolarization: Secondary | ICD-10-CM

## 2018-05-03 DIAGNOSIS — I471 Supraventricular tachycardia: Secondary | ICD-10-CM | POA: Insufficient documentation

## 2018-05-03 DIAGNOSIS — R002 Palpitations: Secondary | ICD-10-CM | POA: Diagnosis not present

## 2018-05-03 DIAGNOSIS — I491 Atrial premature depolarization: Secondary | ICD-10-CM | POA: Insufficient documentation

## 2018-05-03 NOTE — Patient Instructions (Signed)
Medication Instructions:  Your physician recommends that you continue on your current medications as directed. Please refer to the Current Medication list given to you today.  If you need a refill on your cardiac medications before your next appointment, please call your pharmacy.   Lab work: None ordered If you have labs (blood work) drawn today and your tests are completely normal, you will receive your results only by: . MyChart Message (if you have MyChart) OR . A paper copy in the mail If you have any lab test that is abnormal or we need to change your treatment, we will call you to review the results.  Testing/Procedures: None ordered  Follow-Up: At [facility], you and your health needs are our priority.  As part of our continuing mission to provide you with exceptional heart care, we have created designated Provider Care Teams.  These Care Teams include your primary Cardiologist (physician) and Advanced Practice Providers (APPs -  Physician Assistants and Nurse Practitioners) who all work together to provide you with the care you need, when you need it.  Your physician recommends that you schedule a follow-up appointment as needed      

## 2018-05-15 ENCOUNTER — Ambulatory Visit: Payer: Federal, State, Local not specified - PPO | Admitting: Internal Medicine

## 2018-05-22 ENCOUNTER — Encounter: Payer: Self-pay | Admitting: Family Medicine

## 2018-05-22 ENCOUNTER — Ambulatory Visit (INDEPENDENT_AMBULATORY_CARE_PROVIDER_SITE_OTHER): Payer: Federal, State, Local not specified - PPO | Admitting: Family Medicine

## 2018-05-22 ENCOUNTER — Other Ambulatory Visit: Payer: Self-pay

## 2018-05-22 VITALS — Temp 96.8°F | Ht 62.0 in | Wt 222.6 lb

## 2018-05-22 DIAGNOSIS — F330 Major depressive disorder, recurrent, mild: Secondary | ICD-10-CM | POA: Diagnosis not present

## 2018-05-22 DIAGNOSIS — E538 Deficiency of other specified B group vitamins: Secondary | ICD-10-CM

## 2018-05-22 DIAGNOSIS — D692 Other nonthrombocytopenic purpura: Secondary | ICD-10-CM

## 2018-05-22 DIAGNOSIS — I471 Supraventricular tachycardia: Secondary | ICD-10-CM

## 2018-05-22 DIAGNOSIS — F5081 Binge eating disorder: Secondary | ICD-10-CM

## 2018-05-22 DIAGNOSIS — M62830 Muscle spasm of back: Secondary | ICD-10-CM

## 2018-05-22 DIAGNOSIS — Z9884 Bariatric surgery status: Secondary | ICD-10-CM

## 2018-05-22 DIAGNOSIS — M545 Low back pain, unspecified: Secondary | ICD-10-CM

## 2018-05-22 DIAGNOSIS — G8929 Other chronic pain: Secondary | ICD-10-CM

## 2018-05-22 MED ORDER — LISDEXAMFETAMINE DIMESYLATE 50 MG PO CAPS: 50.0000 mg | ORAL_CAPSULE | Freq: Every day | ORAL | 0 refills | Status: DC

## 2018-05-22 MED ORDER — VITAMIN D (ERGOCALCIFEROL) 1.25 MG (50000 UNIT) PO CAPS: 50000.0000 [IU] | ORAL_CAPSULE | ORAL | 3 refills | Status: DC

## 2018-05-22 MED ORDER — CYCLOBENZAPRINE HCL 10 MG PO TABS: 10.0000 mg | ORAL_TABLET | Freq: Every day | ORAL | 1 refills | Status: DC

## 2018-05-22 NOTE — Progress Notes (Signed)
Name: Terri Castillo   MRN: [id number]    DOB: 08-12-1950   Date:05/22/2018       Progress Note  Subjective  Chief Complaint  Chief Complaint  Patient presents with  . 3 Month Follow Up    I connected with  Terri Castillo  on 05/22/18 at 10:00 AM EDT by a video enabled telemedicine application and verified that I am speaking with the correct person using two identifiers.  I discussed the limitations of evaluation and management by telemedicine and the availability of in person appointments. The patient expressed understanding and agreed to proceed. Staff also discussed with the patient that there may be a patient responsible charge related to this service. Patient Location: at home  Provider Location: [facility] Additional Individuals present: daughter   HPI  Depression Mild Recurrent:Terri Castillo has failed Prozac in the past ( but wanted to go back since it was the one with the least amount of side effects) also failed Duloxetine,Wellbutrin and Lexapro, Pristiq worked but too expensive .[name] sinceVyvansewasstarted because of binge eating disorder12/2018-shenoticed increase in energy and motivation but does not want to go back on mediation.  She ran out of Prozac in January and noticed more energy, since than did not want to go back on medication, she is more emotional, crying more and also has irritability but has more energy to get up and go  History of bariatric surgery: she had gastric bypass in 2010, her lowest weight was 180 lbs, but wasgradually gained weight since, she has changed her diet , she states Vyvanse helps but does not want to take it daily. Weight is stable.Anemia resolved, hemoccult cards negative.   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 Weight is stable.   Insulin Resistance: shehas polyphagia again but deniespolydipsia or polyuria.Since COVID-19, she has been eating anything but will try to change her diet   B12 deficiency: shestates does not want the B12 injection, she states she will try SL B12 , we will recheck next visit   Purpura: better since she stopped using aspirin   Chronic back pain: doing well on CBD oil and flexeril at night. Denies side effects. Explained not FDA approved yet . She still had daily intermittent pain. Taking Flexeril for muscle spasms every night   Rhythm change: she noticed her iwatchpicked up Afib back in September 2019, discussed referral to cardiologist , she saw Dr. Okey Dupre virtually because of COVID-19 and is still being monitored, but no recent symptoms.   Vaginal atrophy  and recent bleeding: she had similar episode in the past, s/p hysterectomy , and was using premarin for vaginal atrophy however had been off medication since the week after she started medication,  and noticed bleeding when she wiped back in January , she refused a pelvic at last visit, no symptoms since   Patient Active Problem List   Diagnosis Date Noted  . PSVT (paroxysmal supraventricular tachycardia) (HCC) 05/03/2018  . PVC (premature ventricular contraction) 05/03/2018  . PAC (premature atrial contraction) 05/03/2018  . Palpitations 02/04/2018  . Morbid obesity (HCC) 02/04/2018  . 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 Morbid obesity with BMI of 40.0-44.9, adult (HCC) 02/12/2017  . Insulin resistance 08/11/2016  . Senile purpura (HCC) 08/11/2016  . Facet  arthropathy, lumbar 02/02/2016  . Spondylisthesis 01/12/2016  . Chronic bilateral low back pain 11/01/2015  . Iron deficiency anemia 09/04/2014  . Hyperglycemia 09/04/2014  . Perioral dermatitis 09/04/2014  . 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 B12 deficiency 07/02/2014  . Cervical  pain 07/02/2014  . Arthralgia of lower leg 07/02/2014  . Insomnia 07/02/2014  . 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 Dyslipidemia 07/02/2014  . Dermatitis, eczematoid 07/02/2014  . Genital herpes in women 07/02/2014  . Gastric reflux 07/02/2014  . Bariatric surgery status 07/02/2014  . Herpes simplex 07/02/2014  . H/O: pneumonia 07/02/2014  . Eczema intertrigo 07/02/2014  . Adult BMI 30+ 07/02/2014  . OP (osteoporosis) 07/02/2014  . 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 Perennial allergic rhinitis with seasonal variation 07/02/2014  . Basal cell papilloma 07/02/2014  . Vitamin D deficiency 07/02/2014    Past Surgical History:  Procedure Laterality Date  . ABDOMINAL HYSTERECTOMY  1994  . CHOLECYSTECTOMY  2000  . EYE SURGERY  2000   Cataract Removal  . EYE SURGERY  2002   Cataract Removal  . GASTRIC BYPASS  2000   Mini  . GASTRIC BYPASS  2010    Family History  Problem Relation Age of Onset  . Dementia Mother   . COPD Mother   . Osteoporosis Mother   . Diabetes Father   . Glaucoma Father   . Parkinson's disease Father   . Glaucoma Brother   . Heart Problems Brother   . Heart attack Brother 52  . Brain cancer Daughter        Brain Tumor  . Hypothyroidism Daughter   . Cancer Daughter   . Heart disease Maternal Aunt   . Heart disease Maternal Uncle     Social History   Socioeconomic History  . Marital status: Divorced    Spouse name: Not on file  . Number of children: 2  . Years of education: Not on file  . Highest education level: Some college, no degree  Occupational History  . Not on file  Social Needs  . Financial resource strain: Not hard at all  . Food insecurity:    Worry: Never true    Inability: Never true  . Transportation needs:    Medical: No    Non-medical: No  Tobacco Use  . Smoking status: Former Smoker    Packs/day: 0.50    Years: 5.00    Pack years: 2.50    Types: Cigarettes    Start date: 01/23/1985    Last attempt to  quit: 1992    Years since quitting: 28.3  . Smokeless tobacco: Never Used  Substance and Sexual Activity  . Alcohol use: Not Currently    Comment: 3 drinks/year  . Drug use: No  . Sexual activity: Not Currently    Partners: Male  Lifestyle  . Physical activity:    Days per week: 0 days    Minutes per session: 0 min  . Stress: Not at all  Relationships  . Social connections:    Talks on phone: More than three times a week    Gets together: Twice a week    Attends religious service: Never    Active member of club or organization: No    Attends meetings of clubs or organizations: Never    Relationship status: Divorced  . Intimate partner violence:    Fear of current or ex partner: No    Emotionally abused: No    Physically abused: No    Forced  sexual activity: No  Other Topics Concern  . Not on file  Social History Narrative  . Not on file     Current Outpatient Medications:  .  Calcium Carb-Cholecalciferol (CALCIUM/VITAMIN D PO), Take by mouth daily., Disp: , Rfl:  .  cetirizine (ZYRTEC) 10 MG tablet, Take 10 mg by mouth daily as needed. , Disp: , Rfl:  .  cyclobenzaprine (FLEXERIL) 10 MG tablet, Take 1 tablet (10 mg total) by mouth at bedtime., Disp: 90 tablet, Rfl: 1 .  ferrous sulfate 325 (65 FE) MG tablet, Take 1 tablet (325 mg total) by mouth daily with breakfast., Disp: 30 tablet, Rfl: 12 .  ketoconazole (NIZORAL) 2 % cream, APPLY TO RASH TWICE DAILY (Patient taking differently: APPLY TO RASH TWICE DAILY PRN), Disp: 90 g, Rfl: 0 .  lisdexamfetamine (VYVANSE) 50 MG capsule, Take 1 capsule (50 mg total) by mouth daily. (Patient taking differently: Take 50 mg by mouth daily as needed. ), Disp: 30 capsule, Rfl: 0 .  PREMARIN vaginal cream, Place 1 Applicatorful vaginally 3 (three) times a week. (Patient taking differently: Place 1 Applicatorful vaginally as needed. ), Disp: 42.5 g, Rfl: 12 .  raloxifene (EVISTA) 60 MG tablet, Take 1 tablet (60 mg total) by mouth daily.,  Disp: 90 tablet, Rfl: 4 .  Cyanocobalamin (B-12) 1000 MCG SUBL, Place 1 tablet under the tongue daily. (Patient not taking: Reported on 05/03/2018), Disp: 30 each, Rfl: 12 .  valACYclovir (VALTREX) 500 MG tablet, Take 1 tablet (500 mg total) by mouth 3 (three) times daily. (Patient not taking: Reported on 05/22/2018), Disp: 30 tablet, Rfl: 0  Allergies  Allergen Reactions  . Topamax  [Topiramate]     severe depression  . Trazodone     Other reaction(s): Dizziness    I personally reviewed active problem list, medication list, allergies, family history, social history with the patient/caregiver today.   ROS  Ten systems reviewed and is negative except as mentioned in HPI   Objective  Virtual encounter, vitals not obtained.  Body mass index is 40.71 kg/m.  Physical Exam  Awake, alert and oriented, in no distress   PHQ2/9: [facility] 05/22/2018 02/22/2018 11/21/2017 10/11/2017 08/21/2017  Decreased Interest 0 0 0 0 1  Down, Depressed, Hopeless 0 0 0 0 0  PHQ - 2 Score 0 0 0 0 1  Altered sleeping 0 0 0 0 0  Tired, decreased energy 0 1 3 3 2   Change in appetite 1 1 3 3 3   Feeling bad or failure about yourself  0 0 0 0 0  Trouble concentrating 0 0 0 0 0  Moving slowly or fidgety/restless 0 0 0 0 0  Suicidal thoughts 0 0 0 0 0  PHQ-9 Score 1 2 6 6 6   Difficult doing work/chores Not difficult at all Not difficult at all - - Not difficult at all  Some recent data might be hidden   PHQ-2/9 Result is negative.    Fall Risk: Fall Risk  05/22/2018 02/22/2018 11/21/2017 10/11/2017 08/21/2017  Falls in the past year? 0 0 No No No  Number falls in past yr: - - - - -  Injury with Fall? - - - - -     Assessment & Plan  1. Spasm of muscle, back  - cyclobenzaprine (FLEXERIL) 10 MG tablet; Take 1 tablet (10 mg total) by mouth at bedtime.  Dispense: 90 tablet; Refill: 1  2. Depression, major, recurrent, moderate (HCC)  Discussed mindfulness  - lisdexamfetamine (  VYVANSE)  50 MG capsule; Take 1 capsule (50 mg total) by mouth daily.  Dispense: 30 capsule; Refill: 0  3. Binge eating disorder  - lisdexamfetamine (VYVANSE) 50 MG capsule; Take 1 capsule (50 mg total) by mouth daily.  Dispense: 30 capsule; Refill: 0  4. PSVT (paroxysmal supraventricular tachycardia) (HCC)  Under the care of Dr. Okey Dupre  5. B12 deficiency  On SL supplementation   6. Bariatric surgery status  Discussed increase in exercise and to resume a healthier diet  7. Senile purpura (HCC)  Stable, still on both arms, gave reassurance again   8. Chronic bilateral low back pain without sciatica   I discussed the assessment and treatment plan with the patient. The patient was provided an opportunity to ask questions and all were answered. The patient agreed with the plan and demonstrated an understanding of the instructions.  The patient was advised to call back or seek an in-person evaluation if the symptoms worsen or if the condition fails to improve as anticipated.  I provided 25 minutes of non-face-to-face time during this encounter.

## 2018-08-11 ENCOUNTER — Other Ambulatory Visit: Payer: Self-pay | Admitting: Family Medicine

## 2018-08-11 DIAGNOSIS — F331 Major depressive disorder, recurrent, moderate: Secondary | ICD-10-CM

## 2018-08-12 ENCOUNTER — Encounter: Payer: Self-pay | Admitting: Family Medicine

## 2018-08-13 ENCOUNTER — Other Ambulatory Visit: Payer: Self-pay | Admitting: Family Medicine

## 2018-08-13 DIAGNOSIS — E8881 Metabolic syndrome: Secondary | ICD-10-CM

## 2018-08-13 DIAGNOSIS — R739 Hyperglycemia, unspecified: Secondary | ICD-10-CM

## 2018-08-13 DIAGNOSIS — E559 Vitamin D deficiency, unspecified: Secondary | ICD-10-CM

## 2018-08-13 DIAGNOSIS — E785 Hyperlipidemia, unspecified: Secondary | ICD-10-CM

## 2018-08-13 DIAGNOSIS — R7989 Other specified abnormal findings of blood chemistry: Secondary | ICD-10-CM

## 2018-08-13 DIAGNOSIS — Z9884 Bariatric surgery status: Secondary | ICD-10-CM

## 2018-08-13 DIAGNOSIS — Z862 Personal history of diseases of the blood and blood-forming organs and certain disorders involving the immune mechanism: Secondary | ICD-10-CM

## 2018-08-13 DIAGNOSIS — E538 Deficiency of other specified B group vitamins: Secondary | ICD-10-CM

## 2018-08-20 ENCOUNTER — Encounter: Payer: Self-pay | Admitting: Family Medicine

## 2018-08-22 ENCOUNTER — Other Ambulatory Visit: Payer: Self-pay | Admitting: Family Medicine

## 2018-08-23 ENCOUNTER — Other Ambulatory Visit: Payer: Self-pay | Admitting: Family Medicine

## 2018-08-23 LAB — CBC WITH DIFFERENTIAL/PLATELET
Absolute Monocytes: 348 cells/uL (ref 200–950)
Basophils Absolute: 48 cells/uL (ref 0–200)
Basophils Relative: 1.1 %
Eosinophils Absolute: 110 cells/uL (ref 15–500)
Eosinophils Relative: 2.5 %
HCT: 43 % (ref 35.0–45.0)
Hemoglobin: 14.2 g/dL (ref 11.7–15.5)
Lymphs Abs: 1373 cells/uL (ref 850–3900)
MCH: 30.3 pg (ref 27.0–33.0)
MCHC: 33 g/dL (ref 32.0–36.0)
MCV: 91.7 fL (ref 80.0–100.0)
MPV: 9.8 fL (ref 7.5–12.5)
Monocytes Relative: 7.9 %
Neutro Abs: 2521 cells/uL (ref 1500–7800)
Neutrophils Relative %: 57.3 %
Platelets: 363 10*3/uL (ref 140–400)
RBC: 4.69 10*6/uL (ref 3.80–5.10)
RDW: 12.7 % (ref 11.0–15.0)
Total Lymphocyte: 31.2 %
WBC: 4.4 10*3/uL (ref 3.8–10.8)

## 2018-08-23 LAB — COMPLETE METABOLIC PANEL WITH GFR
AG Ratio: 1.2 (calc) (ref 1.0–2.5)
ALT: 19 U/L (ref 6–29)
AST: 19 U/L (ref 10–35)
Albumin: 3.8 g/dL (ref 3.6–5.1)
Alkaline phosphatase (APISO): 86 U/L (ref 37–153)
BUN: 16 mg/dL (ref 7–25)
CO2: 26 mmol/L (ref 20–32)
Calcium: 9 mg/dL (ref 8.6–10.4)
Chloride: 104 mmol/L (ref 98–110)
Creat: 0.77 mg/dL (ref 0.50–0.99)
GFR, Est African American: 93 mL/min/{1.73_m2} (ref >=60)
GFR, Est Non African American: 80 mL/min/{1.73_m2} (ref >=60)
Globulin: 3.2 g/dL (calc) (ref 1.9–3.7)
Glucose, Bld: 107 mg/dL — ABNORMAL HIGH (ref 65–99)
Potassium: 4.7 mmol/L (ref 3.5–5.3)
Sodium: 139 mmol/L (ref 135–146)
Total Bilirubin: 0.9 mg/dL (ref 0.2–1.2)
Total Protein: 7 g/dL (ref 6.1–8.1)

## 2018-08-23 LAB — HEMOGLOBIN A1C
Hgb A1c MFr Bld: 5.4 % of total Hgb (ref <5.7)
Mean Plasma Glucose: 108 (calc)
eAG (mmol/L): 6 (calc)

## 2018-08-23 LAB — LIPID PANEL
Cholesterol: 199 mg/dL (ref <200)
HDL: 49 mg/dL — ABNORMAL LOW (ref >=50)
LDL Cholesterol (Calc): 122 mg/dL (calc) — ABNORMAL HIGH
Non-HDL Cholesterol (Calc): 150 mg/dL (calc) — ABNORMAL HIGH (ref <130)
Total CHOL/HDL Ratio: 4.1 (calc) (ref <5.0)
Triglycerides: 165 mg/dL — ABNORMAL HIGH (ref <150)

## 2018-08-23 LAB — TSH: TSH: 6.03 mIU/L — ABNORMAL HIGH (ref 0.40–4.50)

## 2018-08-23 LAB — VITAMIN B12: Vitamin B-12: 1296 pg/mL — ABNORMAL HIGH (ref 200–1100)

## 2018-08-23 LAB — VITAMIN D 25 HYDROXY (VIT D DEFICIENCY, FRACTURES): Vit D, 25-Hydroxy: 23 ng/mL — ABNORMAL LOW (ref 30–100)

## 2018-08-27 ENCOUNTER — Encounter: Payer: Self-pay | Admitting: Family Medicine

## 2018-08-27 ENCOUNTER — Other Ambulatory Visit: Payer: Self-pay

## 2018-08-27 ENCOUNTER — Ambulatory Visit (INDEPENDENT_AMBULATORY_CARE_PROVIDER_SITE_OTHER): Payer: Federal, State, Local not specified - PPO | Admitting: Family Medicine

## 2018-08-27 VITALS — HR 79 | Ht 62.0 in | Wt 223.6 lb

## 2018-08-27 DIAGNOSIS — F50819 Binge eating disorder, unspecified: Secondary | ICD-10-CM

## 2018-08-27 DIAGNOSIS — Z9884 Bariatric surgery status: Secondary | ICD-10-CM

## 2018-08-27 DIAGNOSIS — R7989 Other specified abnormal findings of blood chemistry: Secondary | ICD-10-CM | POA: Diagnosis not present

## 2018-08-27 DIAGNOSIS — F325 Major depressive disorder, single episode, in full remission: Secondary | ICD-10-CM

## 2018-08-27 DIAGNOSIS — E538 Deficiency of other specified B group vitamins: Secondary | ICD-10-CM

## 2018-08-27 DIAGNOSIS — M62830 Muscle spasm of back: Secondary | ICD-10-CM | POA: Diagnosis not present

## 2018-08-27 DIAGNOSIS — E559 Vitamin D deficiency, unspecified: Secondary | ICD-10-CM | POA: Diagnosis not present

## 2018-08-27 DIAGNOSIS — E88819 Insulin resistance, unspecified: Secondary | ICD-10-CM

## 2018-08-27 DIAGNOSIS — F5081 Binge eating disorder: Secondary | ICD-10-CM

## 2018-08-27 DIAGNOSIS — E8881 Metabolic syndrome: Secondary | ICD-10-CM

## 2018-08-27 DIAGNOSIS — E785 Hyperlipidemia, unspecified: Secondary | ICD-10-CM

## 2018-08-27 MED ORDER — CYCLOBENZAPRINE HCL 10 MG PO TABS: 10.0000 mg | ORAL_TABLET | Freq: Every day | ORAL | 1 refills | Status: DC

## 2018-08-27 MED ORDER — B-12 1000 MCG SL SUBL: 1.0000 | SUBLINGUAL_TABLET | Freq: Every day | SUBLINGUAL | 0 refills | Status: DC

## 2018-08-27 MED ORDER — LISDEXAMFETAMINE DIMESYLATE 50 MG PO CAPS: 50.0000 mg | ORAL_CAPSULE | Freq: Every day | ORAL | 0 refills | Status: DC

## 2018-08-27 MED ORDER — LEVOTHYROXINE SODIUM 25 MCG PO TABS: 25.0000 ug | ORAL_TABLET | Freq: Every day | ORAL | 1 refills | Status: DC

## 2018-08-27 NOTE — Progress Notes (Signed)
Name: Terri Castillo   MRN: [id number]    DOB: 12-Aug-1950   Date:08/27/2018       Progress Note  Subjective  Chief Complaint  Chief Complaint  Patient presents with  . Medication Refill  . Depression  . Binge eating disorder  . Chronic back pain  . Vaginal Atrophy    I connected with  Janyth Pupa  on 08/27/18 at 11:20 AM EDT by a video enabled telemedicine application and verified that I am speaking with the correct person using two identifiers.  I discussed the limitations of evaluation and management by telemedicine and the availability of in person appointments. The patient expressed understanding and agreed to proceed. Staff also discussed with the patient that there may be a patient responsible charge related to this service. Patient Location: at home Provider Location: [place]  HPI  Depression MildRecurrent:Athaliah has failed Prozac in the past ( but wanted to go back since it was the one with the least amount of side effects) also failed Duloxetine,Wellbutrin and Lexapro, Pristiq worked but too expensive .[name] sinceVyvansewasstarted because of binge eating disorder12/2018-shenoticed increase in energy and motivation but currently not taking medication and is still feeling well. .  She ran out of Prozac in January and noticed more energy, since than did not want to go back on medication.   History of bariatric surgery: she had gastric bypass in 2010, her lowest weight was 180 lbs, but wasgradually gained weight since,, she states Vyvanse helps but does not want to take it daily. Weight is stable.Anemia resolved, hemoccult cards negative. 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 10/2016 Weight was 226 lbs, itcurbed her appetite initially , however starting eat more again, we went up on dose of vyvanse to 50 mg but was only taking prn, currently sleeping well melatonin and would  like to resume medication  Insulin Resistance: shehas polyphagia again but deniespolydipsia or polyuria.Since COVID-19, she has been eating anything but will try to change her diet , elevated fasting glucose but normal A1C on labs done July 2020  B12 deficiency: sheis only on oral supplementation and recent B12 was high, advised to go down to taking it every other day  Chronic back pain:  She still had daily intermittent pain. Taking Flexeril for muscle spasms every night and seems to help with symptoms  Rhythm change: she noticed her iwatchpicked up Afib back in September 2019 she saw Dr. Saunders Revel virtually because of COVID-19 and is still being monitored, but no recent symptoms.    Patient Active Problem List   Diagnosis Date Noted  . PSVT (paroxysmal supraventricular tachycardia) ([place]) 05/03/2018  . PVC (premature ventricular contraction) 05/03/2018  . PAC (premature atrial contraction) 05/03/2018  . Palpitations 02/04/2018  . Morbid obesity ([place]) 02/04/2018  . Inflammatory spondylopathy of lumbar region ([place]) 05/22/2017  . Morbid obesity with BMI of 40.0-44.9, adult ([place]) 02/12/2017  . Insulin resistance 08/11/2016  . Senile purpura ([place]) 08/11/2016  . Facet arthropathy, lumbar 02/02/2016  . Spondylisthesis 01/12/2016  . Chronic bilateral low back pain 11/01/2015  . Iron deficiency anemia 09/04/2014  . Hyperglycemia 09/04/2014  . Perioral dermatitis 09/04/2014  . 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 B12 deficiency 07/02/2014  . Cervical pain 07/02/2014  . Arthralgia of lower leg 07/02/2014  . Insomnia 07/02/2014  . Depression, major, recurrent, mild ([place]) 07/02/2014  . Dyslipidemia 07/02/2014  . Dermatitis, eczematoid 07/02/2014  . Genital herpes in women 07/02/2014  .  Gastric reflux 07/02/2014  . Bariatric surgery status 07/02/2014  . Herpes simplex 07/02/2014  . H/O: pneumonia 07/02/2014  . Eczema intertrigo 07/02/2014  . Adult BMI 30+ 07/02/2014  . OP (osteoporosis)  07/02/2014  . 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 Perennial allergic rhinitis with seasonal variation 07/02/2014  . Basal cell papilloma 07/02/2014  . Vitamin D deficiency 07/02/2014    Past Surgical History:  Procedure Laterality Date  . ABDOMINAL HYSTERECTOMY  1994  . CHOLECYSTECTOMY  2000  . EYE SURGERY  2000   Cataract Removal  . EYE SURGERY  2002   Cataract Removal  . GASTRIC BYPASS  2000   Mini  . GASTRIC BYPASS  2010    Family History  Problem Relation Age of Onset  . Dementia Mother   . COPD Mother   . Osteoporosis Mother   . Diabetes Father   . Glaucoma Father   . Parkinson's disease Father   . Glaucoma Brother   . Heart Problems Brother   . Heart attack Brother 52  . Brain cancer Daughter        Brain Tumor  . Hypothyroidism Daughter   . Cancer Daughter   . Heart disease Maternal Aunt   . Heart disease Maternal Uncle     Social History   Socioeconomic History  . Marital status: Divorced    Spouse name: Not on file  . Number of children: 2  . Years of education: Not on file  . Highest education level: Some college, no degree  Occupational History  . Not on file  Social Needs  . Financial resource strain: Not hard at all  . Food insecurity    Worry: Never true    Inability: Never true  . Transportation needs    Medical: No    Non-medical: No  Tobacco Use  . Smoking status: Former Smoker    Packs/day: 0.50    Years: 5.00    Pack years: 2.50    Types: Cigarettes    Start date: 01/23/1985    Quit date: 1992    Years since quitting: 28.6  . Smokeless tobacco: Never Used  Substance and Sexual Activity  . Alcohol use: Not Currently    Comment: 3 drinks/year  . Drug use: No  . Sexual activity: Not Currently    Partners: Male  Lifestyle  . Physical activity    Days per week: 0 days    Minutes per session: 0 min  . Stress: Not at all  Relationships  . Social connections    Talks on phone: More than three times a week    Gets together: Twice a  week    Attends religious service: Never    Active member of club or organization: No    Attends meetings of clubs or organizations: Never    Relationship status: Divorced  . Intimate partner violence    Fear of current or ex partner: No    Emotionally abused: No    Physically abused: No    Forced sexual activity: No  Other Topics Concern  . Not on file  Social History Narrative  . Not on file     Current Outpatient Medications:  .  Calcium Carb-Cholecalciferol (CALCIUM/VITAMIN D PO), Take by mouth daily., Disp: , Rfl:  .  cetirizine (ZYRTEC) 10 MG tablet, Take 10 mg by mouth daily as needed. , Disp: , Rfl:  .  Cyanocobalamin (B-12) 1000 MCG SUBL, Place 1 tablet under the tongue daily., Disp: 30 each, Rfl: 12 .  cyclobenzaprine (FLEXERIL) 10 MG tablet, Take 1 tablet (10 mg total) by mouth at bedtime., Disp: 90 tablet, Rfl: 1 .  ferrous sulfate 325 (65 FE) MG tablet, Take 1 tablet (325 mg total) by mouth daily with breakfast., Disp: 30 tablet, Rfl: 12 .  ketoconazole (NIZORAL) 2 % cream, APPLY TO RASH TWICE DAILY (Patient taking differently: APPLY TO RASH TWICE DAILY PRN), Disp: 90 g, Rfl: 0 .  lisdexamfetamine (VYVANSE) 50 MG capsule, Take 1 capsule (50 mg total) by mouth daily., Disp: 30 capsule, Rfl: 0 .  PREMARIN vaginal cream, Place 1 Applicatorful vaginally 3 (three) times a week. (Patient taking differently: Place 1 Applicatorful vaginally as needed. ), Disp: 42.5 g, Rfl: 12 .  raloxifene (EVISTA) 60 MG tablet, Take 1 tablet (60 mg total) by mouth daily., Disp: 90 tablet, Rfl: 4 .  valACYclovir (VALTREX) 500 MG tablet, Take 1 tablet (500 mg total) by mouth 3 (three) times daily., Disp: 30 tablet, Rfl: 0 .  Vitamin D, Ergocalciferol, (DRISDOL) 1.25 MG (50000 UT) CAPS capsule, Take 1 capsule (50,000 Units total) by mouth every 7 (seven) days. (Patient not taking: Reported on 08/27/2018), Disp: 12 capsule, Rfl: 3  Allergies  Allergen Reactions  . Topamax  [Topiramate]     severe  depression  . Trazodone     Other reaction(s): Dizziness    I personally reviewed active problem list, medication list, allergies, family history, social history with the patient/caregiver today.   ROS  Ten systems reviewed and is negative except as mentioned in HPI  Objective  Virtual encounter, vitals not obtained.  Body mass index is 40.9 kg/m.  Physical Exam  Awake, alert and oriented   PHQ2/9: [facility] 08/27/2018 05/22/2018 02/22/2018 11/21/2017 10/11/2017  Decreased Interest 0 0 0 0 0  Down, Depressed, Hopeless 0 0 0 0 0  PHQ - 2 Score 0 0 0 0 0  Altered sleeping 0 0 0 0 0  Tired, decreased energy 0 0 1 3 3   Change in appetite 1 1 1 3 3   Feeling bad or failure about yourself  0 0 0 0 0  Trouble concentrating 0 0 0 0 0  Moving slowly or fidgety/restless 0 0 0 0 0  Suicidal thoughts 0 0 0 0 0  PHQ-9 Score 1 1 2 6 6   Difficult doing work/chores Not difficult at all Not difficult at all Not difficult at all - -  Some recent data might be hidden   PHQ-2/9 Result is negative.    Fall Risk: Fall Risk  08/27/2018 05/22/2018 02/22/2018 11/21/2017 10/11/2017  Falls in the past year? 0 0 0 No No  Number falls in past yr: 0 - - - -  Injury with Fall? 0 - - - -     Assessment & Plan  1. B12 deficiency  - Cyanocobalamin (B-12) 1000 MCG SUBL; Place 1 tablet under the tongue daily. Every other day  Dispense: 30 tablet; Refill: 0  2. Spasm of muscle, back  - cyclobenzaprine (FLEXERIL) 10 MG tablet; Take 1 tablet (10 mg total) by mouth at bedtime.  Dispense: 90 tablet; Refill: 1  3. Abnormal TSH  - levothyroxine (SYNTHROID) 25 MCG tablet; Take 1 tablet (25 mcg total) by mouth daily before breakfast.  Dispense: 30 tablet; Refill: 1 - TSH; Future  4. Vitamin D deficiency   5. Dyslipidemia   6. Bariatric surgery status   7. Insulin resistance  Reviewed labs  8. Binge eating disorder  - lisdexamfetamine (VYVANSE) 50  MG capsule; Take 1 capsule (50  mg total) by mouth daily.  Dispense: 30 capsule; Refill: 0 - lisdexamfetamine (VYVANSE) 50 MG capsule; Take 1 capsule (50 mg total) by mouth daily. Fill 09/26/2018  Dispense: 30 capsule; Refill: 0 - lisdexamfetamine (VYVANSE) 50 MG capsule; Take 1 capsule (50 mg total) by mouth daily. Fill 10/25/2018  Dispense: 30 capsule; Refill: 0  9. Major depression in remission (HCC)  - lisdexamfetamine (VYVANSE) 50 MG capsule; Take 1 capsule (50 mg total) by mouth daily.  Dispense: 30 capsule; Refill: 0  I discussed the assessment and treatment plan with the patient. The patient was provided an opportunity to ask questions and all were answered. The patient agreed with the plan and demonstrated an understanding of the instructions.  The patient was advised to call back or seek an in-person evaluation if the symptoms worsen or if the condition fails to improve as anticipated.  I provided 25  minutes of non-face-to-face time during this encounter.

## 2018-08-28 ENCOUNTER — Encounter: Payer: Self-pay | Admitting: Family Medicine

## 2018-10-14 IMAGING — CR DG LUMBAR SPINE 2-3V
1 series · 3 of 3 positions shown · non-contrast
Comparison: None.

CLINICAL DATA: Back pain for several years, no known injury,
initial encounter

EXAM:
LUMBAR SPINE - 3 VIEW

[Series 1: dg lumbar spine 2-3 views · 0.14mm/px · 3 of 3 slices shown]
[im 1/3]
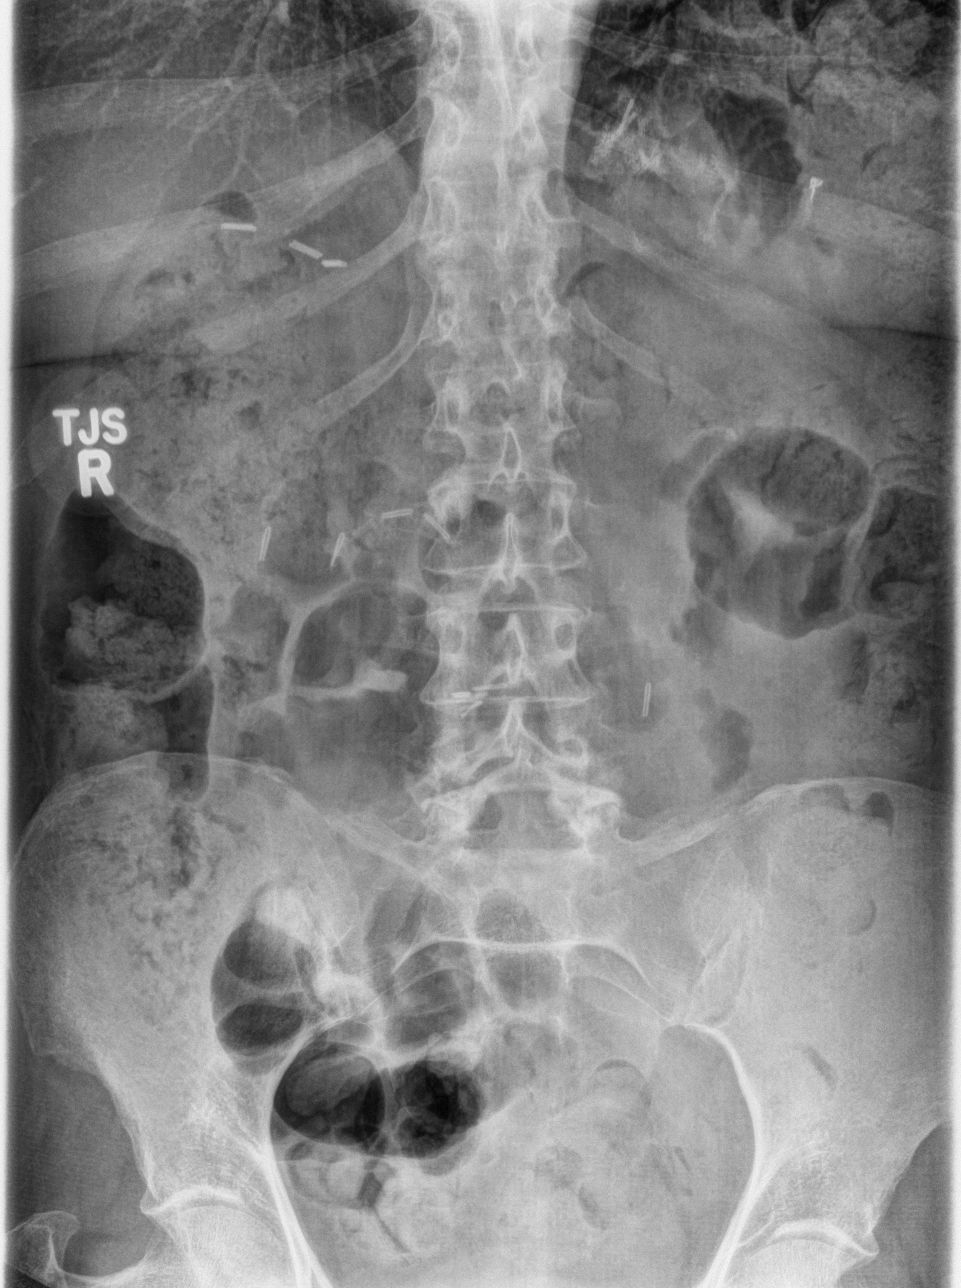
[im 2/3]
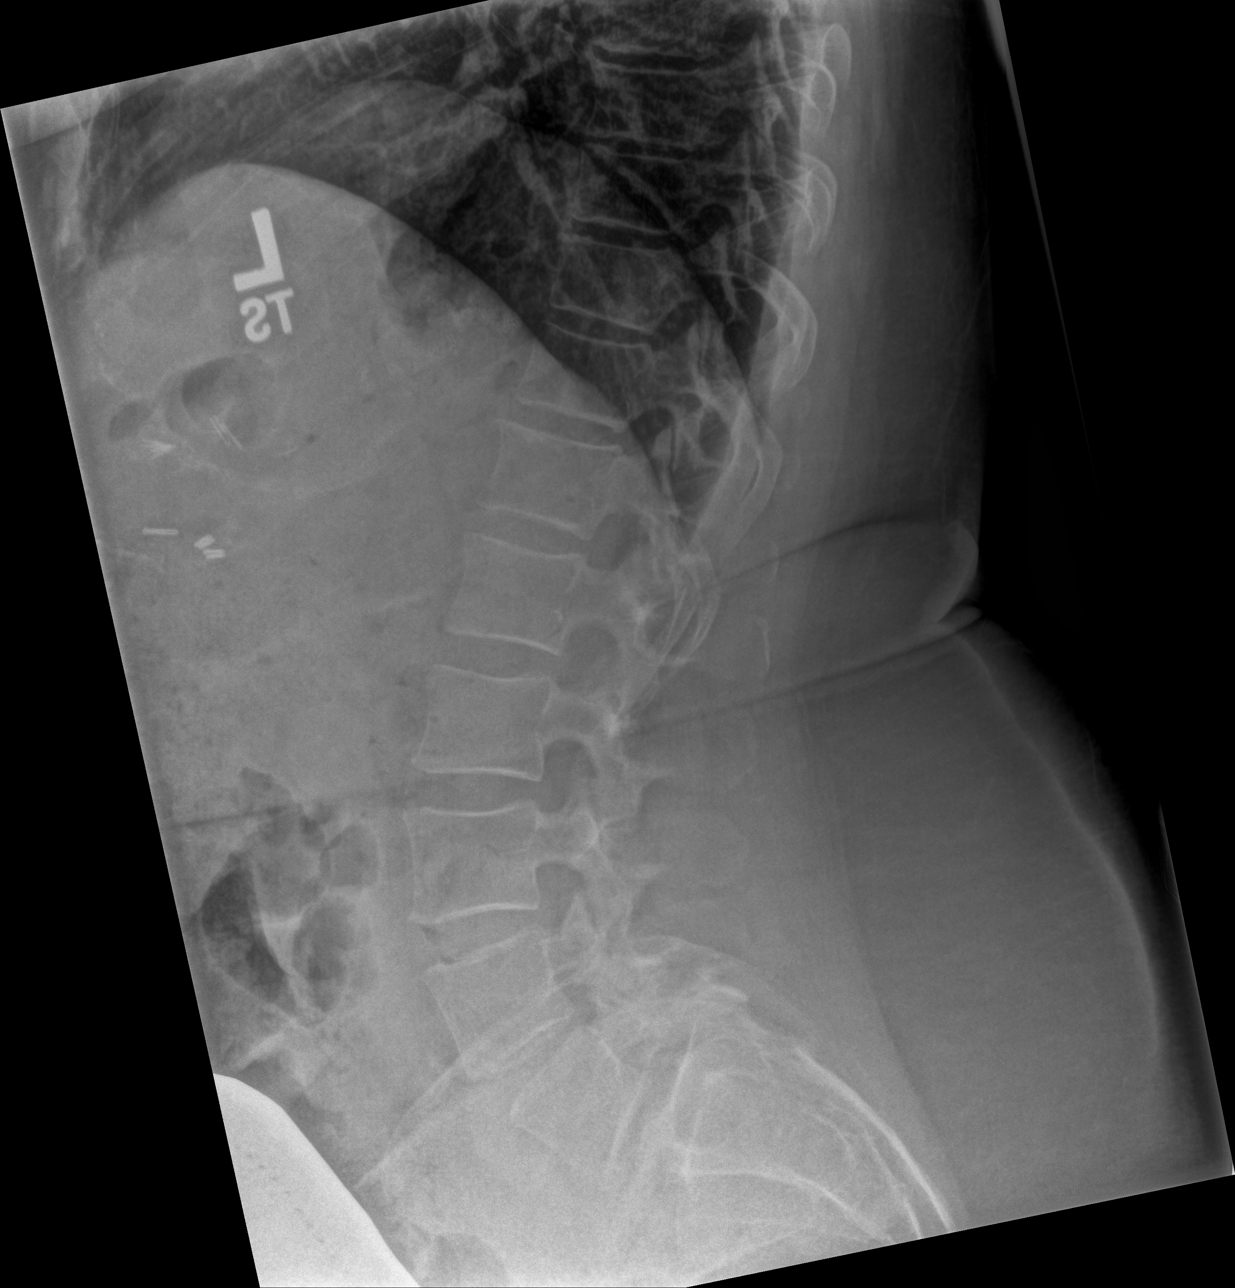
[im 3/3]
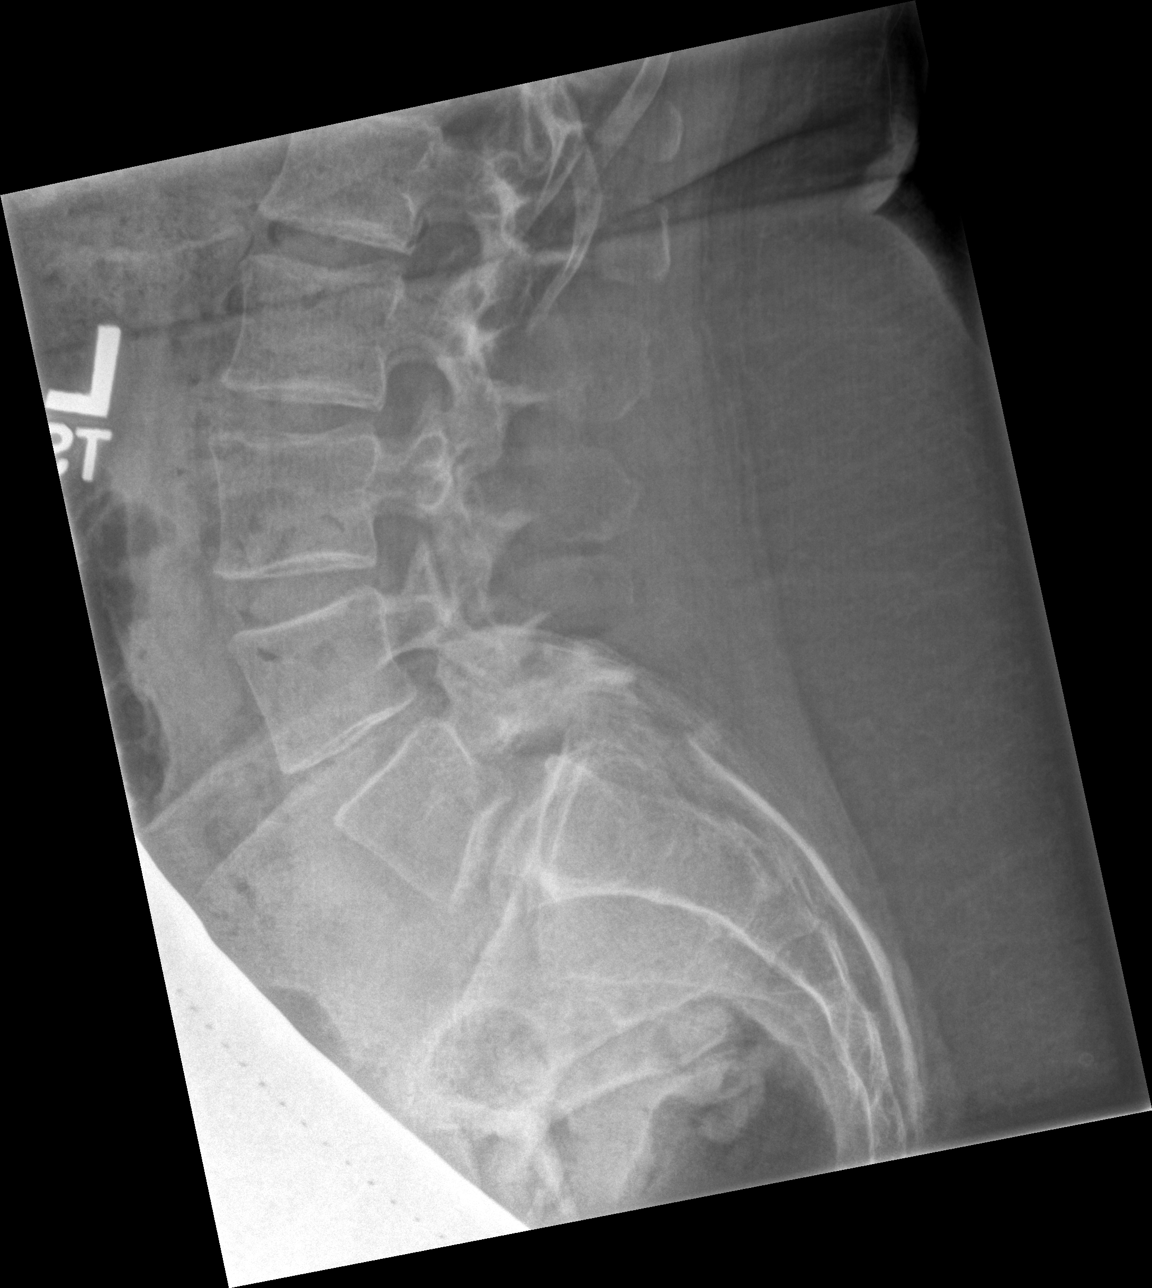

[3 of 3 positions shown; findings below may reference images not displayed]

FINDINGS: Five lumbar type vertebral bodies are well visualized. There is
grade 2 anterolisthesis of L5 on S1 likely related to pars defects
although incompletely evaluated on this exam. Vertebral body height
is well maintained. Fecal material is noted throughout the colon
consistent with a degree of constipation.
IMPRESSION: L5-S1 anterolisthesis as described.

Changes consistent with constipation.

## 2018-10-16 ENCOUNTER — Other Ambulatory Visit: Payer: Self-pay | Admitting: Family Medicine

## 2018-10-16 ENCOUNTER — Ambulatory Visit (INDEPENDENT_AMBULATORY_CARE_PROVIDER_SITE_OTHER): Payer: Federal, State, Local not specified - PPO

## 2018-10-16 VITALS — BP 130/84 | HR 88

## 2018-10-16 DIAGNOSIS — Z0130 Encounter for examination of blood pressure without abnormal findings: Secondary | ICD-10-CM

## 2018-10-16 DIAGNOSIS — R7989 Other specified abnormal findings of blood chemistry: Secondary | ICD-10-CM

## 2018-10-16 LAB — TSH: TSH: 3.98 mIU/L (ref 0.40–4.50)

## 2018-10-16 NOTE — Progress Notes (Signed)
Patient is here for a blood pressure check. Patient denies chest pain, palpitations, shortness of breath or visual disturbances. At previous visit blood pressure was 140/90 with a heart rate of 90. Today during nurse visit first check blood pressure was 130/84 and heart rate was 88. She does not take any blood pressure medications.

## 2018-10-22 ENCOUNTER — Other Ambulatory Visit: Payer: Self-pay | Admitting: Family Medicine

## 2018-10-22 DIAGNOSIS — Z1231 Encounter for screening mammogram for malignant neoplasm of breast: Secondary | ICD-10-CM

## 2018-11-13 ENCOUNTER — Other Ambulatory Visit: Payer: Self-pay | Admitting: Family Medicine

## 2018-11-13 DIAGNOSIS — R7989 Other specified abnormal findings of blood chemistry: Secondary | ICD-10-CM

## 2018-11-27 ENCOUNTER — Other Ambulatory Visit: Payer: Self-pay | Admitting: Family Medicine

## 2018-11-27 DIAGNOSIS — M810 Age-related osteoporosis without current pathological fracture: Secondary | ICD-10-CM

## 2018-12-05 ENCOUNTER — Ambulatory Visit
Admission: RE | Admit: 2018-12-05 | Discharge: 2018-12-05 | Disposition: A | Discharge status: Home / Self Care | Payer: Medicare Other | Source: Ambulatory Visit | Attending: Family Medicine | Admitting: Family Medicine

## 2018-12-05 DIAGNOSIS — Z1231 Encounter for screening mammogram for malignant neoplasm of breast: Secondary | ICD-10-CM

## 2019-01-08 ENCOUNTER — Other Ambulatory Visit: Payer: Self-pay | Admitting: Family Medicine

## 2019-01-08 DIAGNOSIS — R7989 Other specified abnormal findings of blood chemistry: Secondary | ICD-10-CM

## 2019-01-09 ENCOUNTER — Other Ambulatory Visit: Payer: Self-pay | Admitting: Family Medicine

## 2019-01-09 DIAGNOSIS — E538 Deficiency of other specified B group vitamins: Secondary | ICD-10-CM

## 2019-01-09 NOTE — Telephone Encounter (Signed)
Requested medication (s) are due for refill today: yes  Requested medication (s) are on the active medication list: yes  Last refill:  09/14/2018  Future visit scheduled: no  Notes to clinic:  Medication not assigned to a protocol, review manually   Requested Prescriptions  Pending Prescriptions Disp Refills   [facility] VITAMIN B12 1000 MCG tablet [Pharmacy Med Name: [facility] B-12 1,000 MCG TABLET] 100 tablet 3    Sig: TAKE 1 TABLET BY MOUTH EVERY DAY      Off-Protocol Failed - 01/09/2019  3:53 AM      Failed - Medication not assigned to a protocol, review manually.      Passed - Valid encounter within last 12 months    Recent Outpatient Visits           4 months ago Abnormal TSH   [facility] [place], Drue Stager, MD   7 months ago Depression, major, recurrent, mild [facility])   [facility], Drue Stager, MD   10 months ago Depression, major, recurrent, mild [facility])   [facility] Steele Sizer, MD   1 year ago Dyslipidemia   [facility] Steele Sizer, MD   1 year ago Fluttering sensation of heart   [facility] Steele Sizer, MD

## 2019-02-09 ENCOUNTER — Other Ambulatory Visit: Payer: Self-pay | Admitting: Family Medicine

## 2019-02-09 DIAGNOSIS — R7989 Other specified abnormal findings of blood chemistry: Secondary | ICD-10-CM

## 2019-02-19 ENCOUNTER — Other Ambulatory Visit: Payer: Self-pay | Admitting: Family Medicine

## 2019-02-19 DIAGNOSIS — M62830 Muscle spasm of back: Secondary | ICD-10-CM

## 2019-02-19 NOTE — Telephone Encounter (Signed)
Requested medication (s) are due for refill today: yes  Requested medication (s) are on the active medication list: yes  Last refill:  11/23/2018  Future visit scheduled:no  Notes to clinic:  this refill cannot be delegated    Requested Prescriptions  Pending Prescriptions Disp Refills   cyclobenzaprine (FLEXERIL) 10 MG tablet [Pharmacy Med Name: CYCLOBENZAPRINE 10 MG TABLET] 90 tablet 1    Sig: TAKE 1 TABLET BY MOUTH EVERYDAY AT BEDTIME      Not Delegated - Analgesics:  Muscle Relaxants Failed - 02/19/2019  2:28 PM      Failed - This refill cannot be delegated      Passed - Valid encounter within last 6 months    Recent Outpatient Visits           5 months ago Abnormal TSH   [facility] Alba Cory, MD   9 months ago Depression, major, recurrent, mild [facility])   [facility] [place], Danna Hefty, MD   12 months ago Depression, major, recurrent, mild [facility])   [facility] Alba Cory, MD   1 year ago Dyslipidemia   [facility] Alba Cory, MD   1 year ago Fluttering sensation of heart   [facility] Alba Cory, MD

## 2019-02-24 NOTE — Telephone Encounter (Signed)
Pt is scheduled for 03/03/2019

## 2019-03-03 ENCOUNTER — Encounter: Payer: Self-pay | Admitting: Family Medicine

## 2019-03-03 ENCOUNTER — Other Ambulatory Visit: Payer: Self-pay

## 2019-03-03 ENCOUNTER — Ambulatory Visit (INDEPENDENT_AMBULATORY_CARE_PROVIDER_SITE_OTHER): Payer: Federal, State, Local not specified - PPO | Admitting: Family Medicine

## 2019-03-03 VITALS — Wt 226.0 lb

## 2019-03-03 DIAGNOSIS — G8929 Other chronic pain: Secondary | ICD-10-CM

## 2019-03-03 DIAGNOSIS — E559 Vitamin D deficiency, unspecified: Secondary | ICD-10-CM | POA: Diagnosis not present

## 2019-03-03 DIAGNOSIS — Z6841 Body Mass Index (BMI) 40.0 and over, adult: Secondary | ICD-10-CM

## 2019-03-03 DIAGNOSIS — E538 Deficiency of other specified B group vitamins: Secondary | ICD-10-CM

## 2019-03-03 DIAGNOSIS — F325 Major depressive disorder, single episode, in full remission: Secondary | ICD-10-CM | POA: Diagnosis not present

## 2019-03-03 DIAGNOSIS — Z9884 Bariatric surgery status: Secondary | ICD-10-CM

## 2019-03-03 DIAGNOSIS — M545 Low back pain: Secondary | ICD-10-CM

## 2019-03-03 DIAGNOSIS — F5081 Binge eating disorder: Secondary | ICD-10-CM

## 2019-03-03 DIAGNOSIS — E785 Hyperlipidemia, unspecified: Secondary | ICD-10-CM

## 2019-03-03 DIAGNOSIS — E039 Hypothyroidism, unspecified: Secondary | ICD-10-CM

## 2019-03-03 DIAGNOSIS — D692 Other nonthrombocytopenic purpura: Secondary | ICD-10-CM

## 2019-03-03 DIAGNOSIS — F50819 Binge eating disorder, unspecified: Secondary | ICD-10-CM

## 2019-03-03 MED ORDER — LISDEXAMFETAMINE DIMESYLATE 50 MG PO CAPS: 50.0000 mg | ORAL_CAPSULE | Freq: Every day | ORAL | 0 refills | Status: DC

## 2019-03-03 MED ORDER — LEVOTHYROXINE SODIUM 25 MCG PO TABS: 25.0000 ug | ORAL_TABLET | Freq: Every day | ORAL | 1 refills | Status: DC

## 2019-03-03 MED ORDER — ESCITALOPRAM OXALATE 10 MG PO TABS: 10.0000 mg | ORAL_TABLET | Freq: Every day | ORAL | 1 refills | Status: DC

## 2019-03-03 NOTE — Progress Notes (Signed)
Name: Terri Castillo   MRN: [id number]    DOB: Oct 21, 1950   Date:03/03/2019       Progress Note  Subjective  Chief Complaint  Chief Complaint  Patient presents with  . Medication Refill    Depression medication needed.   . Depression    I connected with  Janyth Pupa  on 03/03/19 at 10:00 AM EST by a video enabled telemedicine application and verified that I am speaking with the correct person using two identifiers.  I discussed the limitations of evaluation and management by telemedicine and the availability of in person appointments. The patient expressed understanding and agreed to proceed. Staff also discussed with the patient that there may be a patient responsible charge related to this service. Patient Location: at home Provider Location: [place] has failed Prozac in the past ( but wanted to go back since it was the one with the least amount of side effects) also failed Duloxetine,Wellbutrin and Lexapro, Pristiq worked but too expensive.[name] sinceVyvansewasstarted because of binge eating disorder12/2018-shenoticed increase in energy and motivationbut has been off medication. She states felt very down, crying all the time, no energy , she was feeling very mood and resumed lexapro 20 mg a few months ago and now that she is doing well and is down on 10 mg per day and is doing well on current regiment   History of bariatric surgery: she had gastric bypass in 2010, her lowest weight was 180 lbs, but wasgradually gained weight since,, she states Vyvanse helps but does not want to take it daily.Weight is stable. Unchanged   Bing eating disorder: Started on Vyvanse 10/2016 Weight was 226 lbs, itcurbed her appetite initially , however starting eat more again, she has been out of Vyvanse and went on bing eating animal crackers but has stopped doing it 29 days ago. She would like to try  medication again   Insulin Resistance: shehas polyphagia again but deniespolydipsia or polyuria. elevated fasting glucose but normal A1C on labs done July 2020, she is trying to have healthier snacks and avoiding eating after 9 pm   B12 deficiency: sheis taking supplements a few times a week, since last level was high  Chronic back pain:  She still had daily intermittent pain. Taking Flexeril for muscle spasms every nightand seems to help with symptoms, and is controlling symptoms   Rhythm change: she noticed her iwatchpicked up Afib back in September 2019 she saw Dr. Domenic Polite because of COVID-19 and is still being monitored, very seldom has palpitations, usually triggered by stress or caffeine   Patient Active Problem List   Diagnosis Date Noted  . PSVT (paroxysmal supraventricular tachycardia) ([place]) 05/03/2018  . PVC (premature ventricular contraction) 05/03/2018  . PAC (premature atrial contraction) 05/03/2018  . Palpitations 02/04/2018  . Morbid obesity ([place]) 02/04/2018  . 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 Morbid obesity with BMI of 40.0-44.9, adult ([place]) 02/12/2017  . Insulin resistance 08/11/2016  . Senile purpura ([place]) 08/11/2016  . Facet arthropathy, lumbar 02/02/2016  . Spondylisthesis 01/12/2016  . Chronic bilateral low back pain 11/01/2015  . Iron deficiency anemia 09/04/2014  . Hyperglycemia 09/04/2014  . Perioral dermatitis 09/04/2014  . 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 B12 deficiency 07/02/2014  . Cervical pain 07/02/2014  . Arthralgia of lower leg 07/02/2014  . Insomnia 07/02/2014  . Depression, major, recurrent, mild ([place]) 07/02/2014  . Dyslipidemia 07/02/2014  . Dermatitis, eczematoid 07/02/2014  .  Genital herpes in women 07/02/2014  . Gastric reflux 07/02/2014  . Bariatric surgery status 07/02/2014  . Herpes simplex 07/02/2014  . H/O: pneumonia 07/02/2014  . Eczema intertrigo 07/02/2014  . Adult BMI 30+ 07/02/2014  .  OP (osteoporosis) 07/02/2014  . 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 Perennial allergic rhinitis with seasonal variation 07/02/2014  . Basal cell papilloma 07/02/2014  . Vitamin D deficiency 07/02/2014    Past Surgical History:  Procedure Laterality Date  . ABDOMINAL HYSTERECTOMY  1994  . CHOLECYSTECTOMY  2000  . EYE SURGERY  2000   Cataract Removal  . EYE SURGERY  2002   Cataract Removal  . GASTRIC BYPASS  2000   Mini  . GASTRIC BYPASS  2010    Family History  Problem Relation Age of Onset  . Dementia Mother   . COPD Mother   . Osteoporosis Mother   . Diabetes Father   . Glaucoma Father   . Parkinson's disease Father   . Glaucoma Brother   . Heart Problems Brother   . Heart attack Brother 52  . Brain cancer Daughter        Brain Tumor  . Hypothyroidism Daughter   . Cancer Daughter   . Heart disease Maternal Aunt   . Heart disease Maternal Uncle      Current Outpatient Medications:  .  Calcium Carb-Cholecalciferol (CALCIUM/VITAMIN D PO), Take by mouth daily., Disp: , Rfl:  .  cetirizine (ZYRTEC) 10 MG tablet, Take 10 mg by mouth daily as needed. , Disp: , Rfl:  .  Cyanocobalamin (B-12) 1000 MCG SUBL, Place 1 tablet under the tongue daily. Every other day, Disp: 30 tablet, Rfl: 0 .  cyclobenzaprine (FLEXERIL) 10 MG tablet, TAKE 1 TABLET BY MOUTH EVERYDAY AT BEDTIME, Disp: 90 tablet, Rfl: 1 .  ferrous sulfate 325 (65 FE) MG tablet, Take 1 tablet (325 mg total) by mouth daily with breakfast., Disp: 30 tablet, Rfl: 12 .  ketoconazole (NIZORAL) 2 % cream, APPLY TO RASH TWICE DAILY (Patient taking differently: APPLY TO RASH TWICE DAILY PRN), Disp: 90 g, Rfl: 0 .  levothyroxine (SYNTHROID) 25 MCG tablet, TAKE 1 TABLET BY MOUTH EVERY DAY BEFORE BREAKFAST, Disp: 30 tablet, Rfl: 1 .  lisdexamfetamine (VYVANSE) 50 MG capsule, Take 1 capsule (50 mg total) by mouth daily., Disp: 30 capsule, Rfl: 0 .  lisdexamfetamine (VYVANSE) 50 MG capsule, Take 1 capsule (50 mg total) by mouth daily.  Fill 09/26/2018, Disp: 30 capsule, Rfl: 0 .  lisdexamfetamine (VYVANSE) 50 MG capsule, Take 1 capsule (50 mg total) by mouth daily. Fill 10/25/2018, Disp: 30 capsule, Rfl: 0 .  PREMARIN vaginal cream, Place 1 Applicatorful vaginally 3 (three) times a week. (Patient taking differently: Place 1 Applicatorful vaginally as needed. ), Disp: 42.5 g, Rfl: 12 .  raloxifene (EVISTA) 60 MG tablet, TAKE 1 TABLET BY MOUTH EVERY DAY, Disp: 90 tablet, Rfl: 4 .  valACYclovir (VALTREX) 500 MG tablet, Take 1 tablet (500 mg total) by mouth 3 (three) times daily., Disp: 30 tablet, Rfl: 0 .  Vitamin D, Ergocalciferol, (DRISDOL) 1.25 MG (50000 UT) CAPS capsule, Take 1 capsule (50,000 Units total) by mouth every 7 (seven) days., Disp: 12 capsule, Rfl: 3  Allergies  Allergen Reactions  . Topamax  [Topiramate]     severe depression  . Trazodone     Other reaction(s): Dizziness    I personally reviewed active problem list, medication list, allergies, family history, social history with the patient/caregiver today.   ROS  Constitutional: Negative for  fever or weight change.  Respiratory: Negative for cough and shortness of breath.   Cardiovascular: Negative for chest pain or palpitations.  Gastrointestinal: Negative for abdominal pain, no bowel changes.  Musculoskeletal: Negative for gait problem or joint swelling.  Skin: Negative for rash.  Neurological: Negative for dizziness or headache.  No other specific complaints in a complete review of systems (except as listed in HPI above).  Objective  Virtual encounter, vitals not obtained.  Body mass index is 41.34 kg/m.  Physical Exam  Awake, alert and oriented   PHQ2/9: Depression screen [facility] 2/9 03/03/2019 08/27/2018 05/22/2018 02/22/2018 11/21/2017  Decreased Interest 0 0 0 0 0  Down, Depressed, Hopeless 0 0 0 0 0  PHQ - 2 Score 0 0 0 0 0  Altered sleeping 0 0 0 0 0  Tired, decreased energy 0 0 0 1 3  Change in appetite 0 1 1 1 3   Feeling bad or  failure about yourself  0 0 0 0 0  Trouble concentrating 0 0 0 0 0  Moving slowly or fidgety/restless 0 0 0 0 0  Suicidal thoughts 0 0 0 0 0  PHQ-9 Score 0 1 1 2 6   Difficult doing work/chores - Not difficult at all Not difficult at all Not difficult at all -  Some recent data might be hidden   PHQ-2/9 Result is negative.    Fall Risk: Fall Risk  03/03/2019 08/27/2018 05/22/2018 02/22/2018 11/21/2017  Falls in the past year? 0 0 0 0 No  Number falls in past yr: 0 0 - - -  Injury with Fall? 0 0 - - -    Assessment & Plan  1. Major depression in remission (HCC)  - lisdexamfetamine (VYVANSE) 50 MG capsule; Take 1 capsule (50 mg total) by mouth daily.  Dispense: 30 capsule; Refill: 0 - lisdexamfetamine (VYVANSE) 50 MG capsule; Take 1 capsule (50 mg total) by mouth daily. Fill 03/31/2019  Dispense: 30 capsule; Refill: 0 - lisdexamfetamine (VYVANSE) 50 MG capsule; Take 1 capsule (50 mg total) by mouth daily.  Dispense: 30 capsule; Refill: 0 - escitalopram (LEXAPRO) 10 MG tablet; Take 1 tablet (10 mg total) by mouth daily.  Dispense: 90 tablet; Refill: 1  2. Binge eating disorder  - lisdexamfetamine (VYVANSE) 50 MG capsule; Take 1 capsule (50 mg total) by mouth daily.  Dispense: 30 capsule; Refill: 0 - lisdexamfetamine (VYVANSE) 50 MG capsule; Take 1 capsule (50 mg total) by mouth daily. Fill 03/31/2019  Dispense: 30 capsule; Refill: 0 - lisdexamfetamine (VYVANSE) 50 MG capsule; Take 1 capsule (50 mg total) by mouth daily.  Dispense: 30 capsule; Refill: 0  3. Vitamin D deficiency   4. Bariatric surgery status   5. Dyslipidemia   6. B12 deficiency   7. Morbid obesity with BMI of 40.0-44.9, adult [facility])  Discussed with the patient the risk posed by an increased BMI. Discussed importance of portion control, calorie counting and at least 150 minutes of physical activity weekly. Avoid sweet beverages and drink more water. Eat at least 6 servings of fruit and vegetables daily   8. Senile  purpura (HCC)  stable  9. Chronic bilateral low back pain without sciatica   10. Acquired hypothyroidism  - levothyroxine (SYNTHROID) 25 MCG tablet; Take 1 tablet (25 mcg total) by mouth daily before breakfast.  Dispense: 90 tablet; Refill: 1  I discussed the assessment and treatment plan with the patient. The patient was provided an opportunity to ask questions and all were answered. The patient  agreed with the plan and demonstrated an understanding of the instructions.  The patient was advised to call back or seek an in-person evaluation if the symptoms worsen or if the condition fails to improve as anticipated.  I provided 25 minutes of non-face-to-face time during this encounter.

## 2019-04-23 ENCOUNTER — Encounter: Payer: Self-pay | Admitting: Family Medicine

## 2019-06-03 ENCOUNTER — Other Ambulatory Visit: Payer: Self-pay

## 2019-06-03 ENCOUNTER — Ambulatory Visit: Payer: Federal, State, Local not specified - PPO | Admitting: Family Medicine

## 2019-06-03 ENCOUNTER — Encounter: Payer: Self-pay | Admitting: Family Medicine

## 2019-06-03 VITALS — BP 128/76 | HR 92 | Temp 97.2°F | Resp 16 | Ht 62.0 in | Wt 227.3 lb

## 2019-06-03 DIAGNOSIS — F325 Major depressive disorder, single episode, in full remission: Secondary | ICD-10-CM

## 2019-06-03 DIAGNOSIS — E039 Hypothyroidism, unspecified: Secondary | ICD-10-CM

## 2019-06-03 DIAGNOSIS — L308 Other specified dermatitis: Secondary | ICD-10-CM

## 2019-06-03 DIAGNOSIS — F330 Major depressive disorder, recurrent, mild: Secondary | ICD-10-CM

## 2019-06-03 DIAGNOSIS — F5081 Binge eating disorder: Secondary | ICD-10-CM | POA: Diagnosis not present

## 2019-06-03 DIAGNOSIS — M545 Low back pain: Secondary | ICD-10-CM

## 2019-06-03 DIAGNOSIS — M62830 Muscle spasm of back: Secondary | ICD-10-CM

## 2019-06-03 DIAGNOSIS — Z79899 Other long term (current) drug therapy: Secondary | ICD-10-CM

## 2019-06-03 DIAGNOSIS — F50819 Binge eating disorder, unspecified: Secondary | ICD-10-CM

## 2019-06-03 DIAGNOSIS — R739 Hyperglycemia, unspecified: Secondary | ICD-10-CM

## 2019-06-03 DIAGNOSIS — Z9884 Bariatric surgery status: Secondary | ICD-10-CM | POA: Diagnosis not present

## 2019-06-03 DIAGNOSIS — E785 Hyperlipidemia, unspecified: Secondary | ICD-10-CM

## 2019-06-03 DIAGNOSIS — E559 Vitamin D deficiency, unspecified: Secondary | ICD-10-CM

## 2019-06-03 DIAGNOSIS — G8929 Other chronic pain: Secondary | ICD-10-CM

## 2019-06-03 DIAGNOSIS — E538 Deficiency of other specified B group vitamins: Secondary | ICD-10-CM

## 2019-06-03 DIAGNOSIS — Z6841 Body Mass Index (BMI) 40.0 and over, adult: Secondary | ICD-10-CM

## 2019-06-03 DIAGNOSIS — I471 Supraventricular tachycardia, unspecified: Secondary | ICD-10-CM

## 2019-06-03 DIAGNOSIS — Z862 Personal history of diseases of the blood and blood-forming organs and certain disorders involving the immune mechanism: Secondary | ICD-10-CM

## 2019-06-03 DIAGNOSIS — D692 Other nonthrombocytopenic purpura: Secondary | ICD-10-CM

## 2019-06-03 MED ORDER — LISDEXAMFETAMINE DIMESYLATE 50 MG PO CAPS: 50.0000 mg | ORAL_CAPSULE | Freq: Every day | ORAL | 0 refills | Status: DC

## 2019-06-03 MED ORDER — CYCLOBENZAPRINE HCL 10 MG PO TABS: 10.0000 mg | ORAL_TABLET | Freq: Every day | ORAL | 1 refills | Status: DC

## 2019-06-03 MED ORDER — ESCITALOPRAM OXALATE 10 MG PO TABS: 10.0000 mg | ORAL_TABLET | Freq: Every day | ORAL | 1 refills | Status: DC

## 2019-06-03 MED ORDER — VITAMIN D (ERGOCALCIFEROL) 1.25 MG (50000 UNIT) PO CAPS: 50000.0000 [IU] | ORAL_CAPSULE | ORAL | 3 refills | Status: DC

## 2019-06-03 MED ORDER — TRIAMCINOLONE ACETONIDE 0.1 % EX CREA: 1.0000 "application " | TOPICAL_CREAM | Freq: Two times a day (BID) | CUTANEOUS | 0 refills | Status: AC

## 2019-06-03 NOTE — Progress Notes (Signed)
Name: Terri Castillo   MRN: [id number]    DOB: 01-24-1951   Date:06/03/2019       Progress Note  Subjective  Chief Complaint  Chief Complaint  Patient presents with  . Hypothyroidism    3 month follow up  . ADHD  . Depression    HPI  Depression MildRecurrent:Terri Castillo has failed Prozac in the past ( but wanted to go back since it was the one with the least amount of side effects) also failed Duloxetine,Wellbutrin and Lexapro, Pristiq worked but too expensive.[name] sinceVyvansewasstarted because of binge eating disorder12/2018and she states it has helped her energy level, she worries about her grandson that moved in with her father instead of being with his mother. She also feels invisible, getting older, not working... phq 9 is okay   History of bariatric surgery: she had gastric bypass in 2010, her lowest weight was 180 lbs, but wasgradually gained weight since, weight is stable in the 220's lbs range  Bing eating disorder: Started on Vyvanse 10/2016 Weight was 226 lbs, itcurbed her appetite initially , however starting eat more , she was not very compliant with medication last year, she states since 01/2019 she has been taking it most days. Last weight in our office was prior to pandemic 01/2018 at 223 lbs, today is 227 lbs. She wants to continue medications for now.   Insulin Resistance: shehas polyphagia again but deniespolydipsia or polyuria. elevated fasting glucose but normal A1C on labs done July 2020, we will recheck labs today   B12 deficiency: sheis taking supplements a few times a week, we will recheck labs   Chronic back pain: She still had daily intermittent pain. Taking Flexeril for muscle spasms every nightand seems to help with symptoms, no radiculitis   Rhythm change: she noticed her iwatchpicked up Afib back in September 2019 she saw Dr. Minus Breeding because of COVID-19 and is still being monitored, very seldom has palpitations,  usually triggered by stress or caffeine, Vyvanse does not seem to cause palpitation   Patient Active Problem List   Diagnosis Date Noted  . PSVT (paroxysmal supraventricular tachycardia) (HCC) 05/03/2018  . PVC (premature ventricular contraction) 05/03/2018  . PAC (premature atrial contraction) 05/03/2018  . Palpitations 02/04/2018  . Morbid obesity (HCC) 02/04/2018  . 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 Morbid obesity with BMI of 40.0-44.9, adult (HCC) 02/12/2017  . Insulin resistance 08/11/2016  . Senile purpura (HCC) 08/11/2016  . Facet arthropathy, lumbar 02/02/2016  . Spondylisthesis 01/12/2016  . Chronic bilateral low back pain 11/01/2015  . Iron deficiency anemia 09/04/2014  . Hyperglycemia 09/04/2014  . Perioral dermatitis 09/04/2014  . 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 B12 deficiency 07/02/2014  . Cervical pain 07/02/2014  . Arthralgia of lower leg 07/02/2014  . Insomnia 07/02/2014  . 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 Dyslipidemia 07/02/2014  . Dermatitis, eczematoid 07/02/2014  . Genital herpes in women 07/02/2014  . Gastric reflux 07/02/2014  . Bariatric surgery status 07/02/2014  . Herpes simplex 07/02/2014  . H/O: pneumonia 07/02/2014  . Eczema intertrigo 07/02/2014  . Adult BMI 30+ 07/02/2014  . OP (osteoporosis) 07/02/2014  . 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 Perennial allergic rhinitis with seasonal variation 07/02/2014  . Basal cell papilloma 07/02/2014  . Vitamin D deficiency 07/02/2014    Past Surgical History:  Procedure Laterality Date  . ABDOMINAL HYSTERECTOMY  1994  . CHOLECYSTECTOMY  2000  . EYE SURGERY  2000   Cataract Removal  . EYE SURGERY  2002   Cataract Removal  . GASTRIC BYPASS  2000   Mini  . GASTRIC BYPASS  2010    Family History  Problem Relation Age of Onset  . Dementia Mother   . COPD Mother   . Osteoporosis Mother   . Diabetes Father   . Glaucoma Father   . Parkinson's disease  Father   . Glaucoma Brother   . Heart Problems Brother   . Heart attack Brother 52  . Brain cancer Daughter        Brain Tumor  . Hypothyroidism Daughter   . Cancer Daughter   . Heart disease Maternal Aunt   . Heart disease Maternal Uncle     Social History   Tobacco Use  . Smoking status: Former Smoker    Packs/day: 0.50    Years: 5.00    Pack years: 2.50    Types: Cigarettes    Start date: 01/23/1985    Quit date: 1992    Years since quitting: 29.3  . Smokeless tobacco: Never Used  Substance Use Topics  . Alcohol use: Not Currently    Comment: 3 drinks/year     Current Outpatient Medications:  .  Calcium Carb-Cholecalciferol (CALCIUM/VITAMIN D PO), Take by mouth daily., Disp: , Rfl:  .  cetirizine (ZYRTEC) 10 MG tablet, Take 10 mg by mouth daily as needed. , Disp: , Rfl:  .  Cyanocobalamin (B-12) 1000 MCG SUBL, Place 1 tablet under the tongue daily. Every other day, Disp: 30 tablet, Rfl: 0 .  cyclobenzaprine (FLEXERIL) 10 MG tablet, TAKE 1 TABLET BY MOUTH EVERYDAY AT BEDTIME, Disp: 90 tablet, Rfl: 1 .  escitalopram (LEXAPRO) 10 MG tablet, Take 1 tablet (10 mg total) by mouth daily., Disp: 90 tablet, Rfl: 1 .  ferrous sulfate 325 (65 FE) MG tablet, Take 1 tablet (325 mg total) by mouth daily with breakfast., Disp: 30 tablet, Rfl: 12 .  ketoconazole (NIZORAL) 2 % cream, APPLY TO RASH TWICE DAILY (Patient taking differently: APPLY TO RASH TWICE DAILY PRN), Disp: 90 g, Rfl: 0 .  levothyroxine (SYNTHROID) 25 MCG tablet, Take 1 tablet (25 mcg total) by mouth daily before breakfast., Disp: 90 tablet, Rfl: 1 .  lisdexamfetamine (VYVANSE) 50 MG capsule, Take 1 capsule (50 mg total) by mouth daily., Disp: 30 capsule, Rfl: 0 .  lisdexamfetamine (VYVANSE) 50 MG capsule, Take 1 capsule (50 mg total) by mouth daily. Fill 03/31/2019, Disp: 30 capsule, Rfl: 0 .  lisdexamfetamine (VYVANSE) 50 MG capsule, Take 1 capsule (50 mg total) by mouth daily., Disp: 30 capsule, Rfl: 0 .  PREMARIN  vaginal cream, Place 1 Applicatorful vaginally 3 (three) times a week. (Patient taking differently: Place 1 Applicatorful vaginally as needed. ), Disp: 42.5 g, Rfl: 12 .  raloxifene (EVISTA) 60 MG tablet, TAKE 1 TABLET BY MOUTH EVERY DAY, Disp: 90 tablet, Rfl: 4 .  valACYclovir (VALTREX) 500 MG tablet, Take 1 tablet (500 mg total) by mouth 3 (three) times daily., Disp: 30 tablet, Rfl: 0 .  Vitamin D, Ergocalciferol, (DRISDOL) 1.25 MG (50000 UT) CAPS capsule, Take 1 capsule (50,000 Units total) by mouth every 7 (seven) days., Disp: 12 capsule, Rfl: 3  Allergies  Allergen Reactions  . Topamax  [Topiramate]     severe depression  . Trazodone     Other reaction(s): Dizziness    I personally reviewed active problem list, medication list, allergies, family history, social history, health maintenance with the patient/caregiver today.   ROS  Constitutional: Negative for fever or weight  change.  Respiratory: Negative for cough and shortness of breath.   Cardiovascular: Negative for chest pain or palpitations.  Gastrointestinal: Negative for abdominal pain, no bowel changes.  Musculoskeletal: Negative for gait problem or joint swelling.  Skin: Negative for rash.  Neurological: Negative for dizziness or headache.  No other specific complaints in a complete review of systems (except as listed in HPI above).  Objective  Vitals:   06/03/19 1021  BP: 128/76  Pulse: 92  Resp: 16  Temp: (!) 97.2 F (36.2 C)  TempSrc: Temporal  SpO2: 98%  Weight: 227 lb 4.8 oz (103.1 kg)  Height: 5\' 2"  (1.575 m)    Body mass index is 41.57 kg/m.  Physical Exam  Constitutional: Patient appears well-developed and well-nourished. Obese  No distress.  HEENT: head atraumatic, normocephalic, pupils equal and reactive to light Cardiovascular: Normal rate, regular rhythm and normal heart sounds.  No murmur heard. No BLE edema. Pulmonary/Chest: Effort normal and breath sounds normal. No respiratory  distress. Abdominal: Soft.  There is no tenderness. Psychiatric: Patient has a normal mood and affect. behavior is normal. Judgment and thought content normal. Skin: rash on arms/ some excoriation, a eczematous patch on anterior chest wall   PHQ2/9: Depression screen [facility] 2/9 06/03/2019 03/03/2019 08/27/2018 05/22/2018 02/22/2018  Decreased Interest 0 0 0 0 0  Down, Depressed, Hopeless 1 0 0 0 0  PHQ - 2 Score 1 0 0 0 0  Altered sleeping 0 0 0 0 0  Tired, decreased energy 0 0 0 0 1  Change in appetite 0 0 1 1 1   Feeling bad or failure about yourself  0 0 0 0 0  Trouble concentrating 0 0 0 0 0  Moving slowly or fidgety/restless 0 0 0 0 0  Suicidal thoughts 0 0 0 0 0  PHQ-9 Score 1 0 1 1 2   Difficult doing work/chores Not difficult at all - Not difficult at all Not difficult at all Not difficult at all  Some recent data might be hidden    phq 9 is positive   Fall Risk: Fall Risk  06/03/2019 03/03/2019 08/27/2018 05/22/2018 02/22/2018  Falls in the past year? 0 0 0 0 0  Number falls in past yr: 0 0 0 - -  Injury with Fall? 0 0 0 - -  Follow up Falls evaluation completed - - - -     Functional Status Survey: Is the patient deaf or have difficulty hearing?: No Does the patient have difficulty seeing, even when wearing glasses/contacts?: No Does the patient have difficulty concentrating, remembering, or making decisions?: No Does the patient have difficulty walking or climbing stairs?: No Does the patient have difficulty dressing or bathing?: No Does the patient have difficulty doing errands alone such as visiting a doctor's office or shopping?: No    Assessment & Plan  1. Acquired hypothyroidism  - TSH  2. Morbid obesity with BMI of 40.0-44.9, adult [facility])  Discussed with the patient the risk posed by an increased BMI. Discussed importance of portion control, calorie counting and at least 150 minutes of physical activity weekly. Avoid sweet beverages and drink more water. Eat at least 6  servings of fruit and vegetables daily   3. Bariatric surgery status   4. Binge eating disorder  - lisdexamfetamine (VYVANSE) 50 MG capsule; Take 1 capsule (50 mg total) by mouth daily.  Dispense: 30 capsule; Refill: 0 - lisdexamfetamine (VYVANSE) 50 MG capsule; Take 1 capsule (50 mg total) by mouth daily. June  10 th, 2021  Dispense: 30 capsule; Refill: 0 - lisdexamfetamine (VYVANSE) 50 MG capsule; Take 1 capsule (50 mg total) by mouth daily.  Dispense: 30 capsule; Refill: 0  5. B12 deficiency  - Vitamin B12  6. Vitamin D deficiency  - CBC with Differential/Platelet - VITAMIN D 25 Hydroxy (Vit-D Deficiency, Fractures) - Vitamin D, Ergocalciferol, (DRISDOL) 1.25 MG (50000 UNIT) CAPS capsule; Take 1 capsule (50,000 Units total) by mouth every 7 (seven) days.  Dispense: 12 capsule; Refill: 3  7. Dyslipidemia  - Lipid panel  8. Senile purpura (HCC)  On both arms   9. Chronic bilateral low back pain without sciatica  stable  10. History of iron deficiency anemia  - CBC with Differential/Platelet  11. PSVT (paroxysmal supraventricular tachycardia) (HCC)  Advised to not go up on Vyvasne because of PSVT  12. Mild recurrent major depression (HCC)  - lisdexamfetamine (VYVANSE) 50 MG capsule; Take 1 capsule (50 mg total) by mouth daily.  Dispense: 30 capsule; Refill: 0 - lisdexamfetamine (VYVANSE) 50 MG capsule; Take 1 capsule (50 mg total) by mouth daily. June 10 th, 2021  Dispense: 30 capsule; Refill: 0 - lisdexamfetamine (VYVANSE) 50 MG capsule; Take 1 capsule (50 mg total) by mouth daily.  Dispense: 30 capsule; Refill: 0 - escitalopram (LEXAPRO) 10 MG tablet; Take 1 tablet (10 mg total) by mouth daily.  Dispense: 90 tablet; Refill: 1  13. Spasm of muscle, back  - cyclobenzaprine (FLEXERIL) 10 MG tablet; Take 1 tablet (10 mg total) by mouth at bedtime.  Dispense: 90 tablet; Refill: 1  14. Other eczema  - triamcinolone cream (KENALOG) 0.1 %; Apply 1 application topically  2 (two) times daily. Mix with lotion at home  Dispense: 453.6 g; Refill: 0  15. Long-term use of high-risk medication  - COMPLETE METABOLIC PANEL WITH GFR  16. Hyperglycemia  - Hemoglobin A1c

## 2019-06-30 ENCOUNTER — Encounter: Payer: Self-pay | Admitting: Family Medicine

## 2019-07-09 ENCOUNTER — Other Ambulatory Visit: Payer: Self-pay | Admitting: Family Medicine

## 2019-07-09 DIAGNOSIS — F5081 Binge eating disorder: Secondary | ICD-10-CM

## 2019-07-09 DIAGNOSIS — F330 Major depressive disorder, recurrent, mild: Secondary | ICD-10-CM

## 2019-08-04 ENCOUNTER — Encounter: Payer: Self-pay | Admitting: Family Medicine

## 2019-08-11 ENCOUNTER — Encounter: Payer: Self-pay | Admitting: Family Medicine

## 2019-08-11 DIAGNOSIS — F330 Major depressive disorder, recurrent, mild: Secondary | ICD-10-CM

## 2019-08-11 DIAGNOSIS — F5081 Binge eating disorder: Secondary | ICD-10-CM

## 2019-08-15 ENCOUNTER — Other Ambulatory Visit: Payer: Self-pay

## 2019-08-15 ENCOUNTER — Encounter: Payer: Self-pay | Admitting: Family Medicine

## 2019-08-15 DIAGNOSIS — N951 Menopausal and female climacteric states: Secondary | ICD-10-CM

## 2019-08-17 MED ORDER — PREMARIN 0.625 MG/GM VA CREA: 1.0000 | TOPICAL_CREAM | VAGINAL | 12 refills | Status: AC

## 2019-08-28 ENCOUNTER — Other Ambulatory Visit: Payer: Self-pay | Admitting: Family Medicine

## 2019-08-28 DIAGNOSIS — E039 Hypothyroidism, unspecified: Secondary | ICD-10-CM

## 2019-08-28 LAB — COMPLETE METABOLIC PANEL WITH GFR
AG Ratio: 1.2 (calc) (ref 1.0–2.5)
ALT: 28 U/L (ref 6–29)
AST: 26 U/L (ref 10–35)
Albumin: 3.8 g/dL (ref 3.6–5.1)
Alkaline phosphatase (APISO): 95 U/L (ref 37–153)
BUN: 16 mg/dL (ref 7–25)
CO2: 28 mmol/L (ref 20–32)
Calcium: 9.2 mg/dL (ref 8.6–10.4)
Chloride: 104 mmol/L (ref 98–110)
Creat: 0.79 mg/dL (ref 0.50–0.99)
GFR, Est African American: 90 mL/min/{1.73_m2} (ref >=60)
GFR, Est Non African American: 77 mL/min/{1.73_m2} (ref >=60)
Globulin: 3.2 g/dL (calc) (ref 1.9–3.7)
Glucose, Bld: 95 mg/dL (ref 65–99)
Potassium: 5.2 mmol/L (ref 3.5–5.3)
Sodium: 139 mmol/L (ref 135–146)
Total Bilirubin: 0.8 mg/dL (ref 0.2–1.2)
Total Protein: 7 g/dL (ref 6.1–8.1)

## 2019-08-28 LAB — CBC WITH DIFFERENTIAL/PLATELET
Absolute Monocytes: 361 cells/uL (ref 200–950)
Basophils Absolute: 40 cells/uL (ref 0–200)
Basophils Relative: 0.9 %
Eosinophils Absolute: 101 cells/uL (ref 15–500)
Eosinophils Relative: 2.3 %
HCT: 44.8 % (ref 35.0–45.0)
Hemoglobin: 14.8 g/dL (ref 11.7–15.5)
Lymphs Abs: 1043 cells/uL (ref 850–3900)
MCH: 30.6 pg (ref 27.0–33.0)
MCHC: 33 g/dL (ref 32.0–36.0)
MCV: 92.6 fL (ref 80.0–100.0)
MPV: 10.3 fL (ref 7.5–12.5)
Monocytes Relative: 8.2 %
Neutro Abs: 2856 cells/uL (ref 1500–7800)
Neutrophils Relative %: 64.9 %
Platelets: 342 10*3/uL (ref 140–400)
RBC: 4.84 10*6/uL (ref 3.80–5.10)
RDW: 12.7 % (ref 11.0–15.0)
Total Lymphocyte: 23.7 %
WBC: 4.4 10*3/uL (ref 3.8–10.8)

## 2019-08-28 LAB — LIPID PANEL
Cholesterol: 139 mg/dL (ref <200)
HDL: 41 mg/dL — ABNORMAL LOW (ref >=50)
LDL Cholesterol (Calc): 80 mg/dL (calc)
Non-HDL Cholesterol (Calc): 98 mg/dL (calc) (ref <130)
Total CHOL/HDL Ratio: 3.4 (calc) (ref <5.0)
Triglycerides: 96 mg/dL (ref <150)

## 2019-08-28 LAB — HEMOGLOBIN A1C
Hgb A1c MFr Bld: 5.2 % of total Hgb (ref <5.7)
Mean Plasma Glucose: 103 (calc)
eAG (mmol/L): 5.7 (calc)

## 2019-08-28 LAB — VITAMIN D 25 HYDROXY (VIT D DEFICIENCY, FRACTURES): Vit D, 25-Hydroxy: 65 ng/mL (ref 30–100)

## 2019-08-28 LAB — VITAMIN B12: Vitamin B-12: 962 pg/mL (ref 200–1100)

## 2019-08-28 LAB — TSH: TSH: 3.42 mIU/L (ref 0.40–4.50)

## 2019-08-31 ENCOUNTER — Other Ambulatory Visit: Payer: Self-pay | Admitting: Family Medicine

## 2019-09-03 ENCOUNTER — Encounter: Payer: Self-pay | Admitting: Family Medicine

## 2019-09-03 ENCOUNTER — Ambulatory Visit (INDEPENDENT_AMBULATORY_CARE_PROVIDER_SITE_OTHER): Payer: Federal, State, Local not specified - PPO | Admitting: Family Medicine

## 2019-09-03 DIAGNOSIS — F5081 Binge eating disorder: Secondary | ICD-10-CM

## 2019-09-03 DIAGNOSIS — M25561 Pain in right knee: Secondary | ICD-10-CM

## 2019-09-03 DIAGNOSIS — M810 Age-related osteoporosis without current pathological fracture: Secondary | ICD-10-CM | POA: Diagnosis not present

## 2019-09-03 DIAGNOSIS — F330 Major depressive disorder, recurrent, mild: Secondary | ICD-10-CM

## 2019-09-03 DIAGNOSIS — E039 Hypothyroidism, unspecified: Secondary | ICD-10-CM

## 2019-09-03 DIAGNOSIS — E785 Hyperlipidemia, unspecified: Secondary | ICD-10-CM

## 2019-09-03 DIAGNOSIS — Z9884 Bariatric surgery status: Secondary | ICD-10-CM

## 2019-09-03 DIAGNOSIS — E559 Vitamin D deficiency, unspecified: Secondary | ICD-10-CM

## 2019-09-03 MED ORDER — LISDEXAMFETAMINE DIMESYLATE 50 MG PO CAPS: 50.0000 mg | ORAL_CAPSULE | Freq: Every day | ORAL | 0 refills | Status: DC

## 2019-09-03 MED ORDER — ESCITALOPRAM OXALATE 20 MG PO TABS: 20.0000 mg | ORAL_TABLET | Freq: Every day | ORAL | 1 refills | Status: DC

## 2019-09-03 MED ORDER — RALOXIFENE HCL 60 MG PO TABS: 60.0000 mg | ORAL_TABLET | Freq: Every day | ORAL | 4 refills | Status: DC

## 2019-09-03 NOTE — Progress Notes (Signed)
Name: Terri Castillo   MRN: [id number]    DOB: May 19, 1950   Date:09/03/2019       Progress Note  Subjective  Chief Complaint  Chief Complaint  Patient presents with   Depression   Hypothyroidism   Dyslipidemia    I connected with  [name] on 09/03/19 at 11:20 AM EDT by telephone and verified that I am speaking with the correct person using two identifiers.  I discussed the limitations, risks, security and privacy concerns of performing an evaluation and management service by telephone and the availability of in person appointments. Staff also discussed with the patient that there may be a patient responsible charge related to this service. Patient Location: at work  Provider Location: [facility]   HPI  Depression MildRecurrent:Diamonds has failed Prozac in the past ( but wanted to go back since it was the one with the least amount of side effects) also failed Duloxetine,Wellbutrin and Lexapro, Pristiq worked but too expensive.[name] sinceVyvansewasstarted because of binge eatingdisorder12/2018and she states it has helped her energy level. She states she would like to go up on Lexapro because she has been crying more than usual lately. She is working part time and has helped her - staying busy at  [employer]  / helps her emotionally.   History of bariatric surgery: she had gastric bypass in 2010, her lowest weight was 180 lbs, but wasgradually gained weight since, weight is stable in the 220's lbs range, currently on Octavia diet at 221 lbs . Continue life style changes. 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 Last weight in our office was prior to pandemic 01/2018  at 223 lbs,last visit it was 227 lbs, at home today 208 lbs , she started [employer] in June, starting weight ( prior to diet change ) was 221 lbs  Insulin Resistance: shehas polyphagia again but deniespolydipsia or polyuria. elevated fasting glucose but normal A1C reviewed labs done this week.   B12 deficiency: sheistaking supplements a few times a week, last level was normal   Chronic back pain: She still had daily intermittent pain. Taking Flexeril for muscle spasms every nightand seems to help with symptoms, no radiculitis   Rhythm change: she noticed her iwatchpicked up Afib back in September 2019 she saw Dr. Minus Breeding because of COVID-19 and is still being monitored,very seldom has palpitations, usually triggered by stress or caffeine, Vyvanse does not seem to cause palpitation . Unchanged   Hypothyroidism: normal TSH, continue current dose of levothyroxine  Right knee pain: going on for about 2 months, initially very tender and swollen, still feels unsteady, has mild effusion and a dull ache, she has seen Dr. 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   Morbid obesity with BMI of 40.0-44.9, adult (HCC) 02/12/2017   Insulin resistance 08/11/2016   Senile  purpura (HCC) 08/11/2016   Facet arthropathy, lumbar 02/02/2016   Spondylisthesis 01/12/2016   Chronic bilateral low back pain 11/01/2015   Iron deficiency anemia 09/04/2014   Hyperglycemia 09/04/2014   Perioral dermatitis 09/04/2014   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   B12 deficiency 07/02/2014   Cervical pain  07/02/2014   Arthralgia of lower leg 07/02/2014   Insomnia 07/02/2014   Depression, major, recurrent, mild (HCC) 07/02/2014   Dyslipidemia 07/02/2014   Dermatitis, eczematoid 07/02/2014   Genital herpes in women 07/02/2014   Gastric reflux 07/02/2014   Bariatric surgery status 07/02/2014   Herpes simplex 07/02/2014   H/O: pneumonia 07/02/2014   Eczema intertrigo 07/02/2014   Adult BMI 30+ 07/02/2014   OP (osteoporosis) 07/02/2014   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   Perennial allergic rhinitis with seasonal variation 07/02/2014   Basal cell papilloma 07/02/2014   Vitamin D deficiency 07/02/2014    Past Surgical History:  Procedure Laterality Date   ABDOMINAL HYSTERECTOMY  1994   CHOLECYSTECTOMY  2000   EYE SURGERY  2000   Cataract Removal   EYE SURGERY  2002   Cataract Removal   GASTRIC BYPASS  2000   Mini   GASTRIC BYPASS  2010    Family History  Problem Relation Age of Onset   Dementia Mother    COPD Mother    Osteoporosis Mother    Diabetes Father    Glaucoma Father    Parkinson's disease Father    Glaucoma Brother    Heart Problems Brother    Heart attack Brother [id number]   Brain cancer Daughter        Brain Tumor   Hypothyroidism Daughter    Cancer Daughter    Heart disease Maternal Aunt    Heart disease Maternal Uncle       Current Outpatient Medications:    Calcium Carb-Cholecalciferol (CALCIUM/VITAMIN D PO), Take by mouth daily., Disp: , Rfl:    cetirizine (ZYRTEC) 10 MG tablet, Take 10 mg by mouth daily as needed. , Disp: , Rfl:    Cyanocobalamin (B-12) 1000 MCG SUBL, Place 1 tablet under the tongue daily. Every other day, Disp: 30 tablet, Rfl: 0   cyclobenzaprine (FLEXERIL) 10 MG tablet, Take 1 tablet (10 mg total) by mouth at bedtime., Disp: 90 tablet, Rfl: 1   diclofenac Sodium (VOLTAREN) 1 % GEL, Voltaren 1 % topical gel, Disp: , Rfl:    escitalopram (LEXAPRO) 10 MG tablet, Take 1 tablet (10 mg total) by mouth  daily., Disp: 90 tablet, Rfl: 1   ferrous sulfate 325 (65 FE) MG tablet, Take 1 tablet (325 mg total) by mouth daily with breakfast., Disp: 30 tablet, Rfl: 12   ketoconazole (NIZORAL) 2 % cream, APPLY TO RASH TWICE DAILY (Patient taking differently: APPLY TO RASH TWICE DAILY PRN), Disp: 90 g, Rfl: 0   levothyroxine (SYNTHROID) 25 MCG tablet, TAKE 1 TABLET BY MOUTH DAILY BEFORE BREAKFAST., Disp: 90 tablet, Rfl: 1   lisdexamfetamine (VYVANSE) 50 MG capsule, Take 1 capsule (50 mg total) by mouth daily., Disp: 30 capsule, Rfl: 0   lisdexamfetamine (VYVANSE) 50 MG capsule, Take 1 capsule (50 mg total) by mouth daily. June 10 th, 2021, Disp: 30 capsule, Rfl: 0   lisdexamfetamine (VYVANSE) 50 MG capsule, Take 1 capsule (50 mg total) by mouth daily., Disp: 30 capsule, Rfl: 0   PREMARIN vaginal cream, Place 1 Applicatorful vaginally 3 (three) times a week., Disp: 42.5 g, Rfl: 12   raloxifene (EVISTA) 60 MG tablet, TAKE 1 TABLET BY MOUTH EVERY DAY,  Disp: 90 tablet, Rfl: 4   triamcinolone cream (KENALOG) 0.1 %, Apply 1 application topically 2 (two) times daily. Mix with lotion at home, Disp: 453.6 g, Rfl: 0   valACYclovir (VALTREX) 500 MG tablet, Take 1 tablet (500 mg total) by mouth 3 (three) times daily., Disp: 30 tablet, Rfl: 0   Vitamin D, Ergocalciferol, (DRISDOL) 1.25 MG (50000 UNIT) CAPS capsule, Take 1 capsule (50,000 Units total) by mouth every 7 (seven) days., Disp: 12 capsule, Rfl: 3  Allergies  Allergen Reactions   Topamax  [Topiramate]     severe depression   Trazodone     Other reaction(s): Dizziness    I personally reviewed active problem list, medication list, allergies, family history, social history, health maintenance with the patient/caregiver today.   ROS  Ten systems reviewed and is negative except as mentioned in HPI   Objective  Virtual encounter, vitals not obtained.  There is no height or weight on file to calculate BMI.  Physical Exam  Awake, alert and  oriented   PHQ2/9: Depression screen [facility] 2/9 09/03/2019 06/03/2019 03/03/2019 08/27/2018 05/22/2018  Decreased Interest 0 0 0 0 0  Down, Depressed, Hopeless 0 1 0 0 0  PHQ - 2 Score 0 1 0 0 0  Altered sleeping 0 0 0 0 0  Tired, decreased energy 0 0 0 0 0  Change in appetite 0 0 0 1 1  Feeling bad or failure about yourself  0 0 0 0 0  Trouble concentrating 0 0 0 0 0  Moving slowly or fidgety/restless 0 0 0 0 0  Suicidal thoughts 0 0 0 0 0  PHQ-9 Score 0 1 0 1 1  Difficult doing work/chores - Not difficult at all - Not difficult at all Not difficult at all  Some recent data might be hidden   PHQ-2/9 Result is negative.    Fall Risk: Fall Risk  09/03/2019 06/03/2019 03/03/2019 08/27/2018 05/22/2018  Falls in the past year? 0 0 0 0 0  Number falls in past yr: 0 0 0 0 -  Injury with Fall? 0 0 0 0 -  Follow up - Falls evaluation completed - - -     Assessment & Plan  1. Binge eating disorder  - lisdexamfetamine (VYVANSE) 50 MG capsule; Take 1 capsule (50 mg total) by mouth daily.  Dispense: 30 capsule; Refill: 0 - lisdexamfetamine (VYVANSE) 50 MG capsule; Take 1 capsule (50 mg total) by mouth daily. Fill 10/03/2019  Dispense: 30 capsule; Refill: 0 - lisdexamfetamine (VYVANSE) 50 MG capsule; Take 1 capsule (50 mg total) by mouth daily.  Dispense: 30 capsule; Refill: 0  2. Acquired hypothyroidism   3. Mild recurrent major depression (HCC)  - escitalopram (LEXAPRO) 20 MG tablet; Take 1 tablet (20 mg total) by mouth daily.  Dispense: 90 tablet; Refill: 1 - lisdexamfetamine (VYVANSE) 50 MG capsule; Take 1 capsule (50 mg total) by mouth daily.  Dispense: 30 capsule; Refill: 0 - lisdexamfetamine (VYVANSE) 50 MG capsule; Take 1 capsule (50 mg total) by mouth daily. Fill 10/03/2019  Dispense: 30 capsule; Refill: 0 - lisdexamfetamine (VYVANSE) 50 MG capsule; Take 1 capsule (50 mg total) by mouth daily.  Dispense: 30 capsule; Refill: 0  4. Osteoporosis without current pathological fracture,  unspecified osteoporosis type  - raloxifene (EVISTA) 60 MG tablet; Take 1 tablet (60 mg total) by mouth daily.  Dispense: 90 tablet; Refill: 4  5. Bariatric surgery status   6. Dyslipidemia  Doing well on current regiment  7. Vitamin D deficiency  Continue supplements   8. Right anterior knee pain  - Ambulatory referral to Orthopedic Surgery   I discussed the assessment and treatment plan with the patient. The patient was provided an opportunity to ask questions and all were answered. The patient agreed with the plan and demonstrated an understanding of the instructions.   The patient was advised to call back or seek an in-person evaluation if the symptoms worsen or if the condition fails to improve as anticipated.  I provided 25  minutes of non-face-to-face time during this encounter.  Ruel Favors, MD

## 2019-10-10 ENCOUNTER — Encounter: Payer: Self-pay | Admitting: Family Medicine

## 2019-11-10 ENCOUNTER — Other Ambulatory Visit: Payer: Self-pay | Admitting: Family Medicine

## 2019-11-10 DIAGNOSIS — Z1231 Encounter for screening mammogram for malignant neoplasm of breast: Secondary | ICD-10-CM

## 2019-12-08 NOTE — Progress Notes (Signed)
Name: Terri Castillo   MRN: [id number]  [name]  DOB: 12/21/1950   Date:12/09/2019       Progress Note  Subjective  Chief Complaint  Follow Up  HPI   Depression MildRecurrent:Terri Castillo has failed Prozac in the past ( but wanted to go back since it was the one with the least amount of side effects) also failed Duloxetine,Wellbutrin and Lexapro, Pristiq worked but too expensive.[name] sinceVyvansewasstarted because of binge eatingdisorder12/2018and she states it has helped her energy level. She asked to go up on the dose of Lexapro and since went to 20 mg no longer crying and is doing well . She is working part time and has helped her - staying busy at  [employer]  / helps her emotionally.   History of bariatric surgery: she had gastric bypass in 2010, her lowest weight was 180 lbs, but wasgradually gained weight since, weight is stable in the 220's lbs range, she started [employer] diet on June 28 th, 2021 at a weight of 221 lbs and is down to 193.2 lbs She states she is feeling good. Continue life style changes. Eating snacks from [employer] 5 times a day and one meal a day - lean protein and green   Bing eating disorder: Started on Vyvanse 10/2016 Weight was 226 lbs, itcurbed her appetite initially , however starting eat more , she was not very compliant with medication she takes three or four times a week Last weight in our office was prior to pandemic 01/2018 at 223 lbs,last visit it was 227 lbs, , she started [employer] diet in June, starting weight ( prior to diet change ) was 221 lbs, today weight is down to 193.2 lbs.   Insulin Resistance: shehas polyphagia again but deniespolydipsia or polyuria. elevated fasting glucose but normal A1C on her last labs.   B12 deficiency: sheistaking supplements a few times a week. Unchanged   Chronic back pain: She still had daily intermittent pain. Taking Flexeril for muscle spasms every nightand seems to help with symptoms, no  radiculitis . She is stable, doing well.   Rhythm change: she noticed her iwatchpicked up Afib back in September 2019 she saw Dr. Minus BreedingEndvirtually because of COVID-19 and is still being monitored,very seldom has palpitations, Vyvanse does not seem to cause palpitation . She has been feeling well.   Hypothyroidism: normal TSH, continue current dose of levothyroxine, she denies hair loss, she has chronic dry skin. She states she has noticed some constipation because of change in diet, she has been taking Miralax to control symptoms . Discussed looking for blood in stools and if symptoms gets worse consider colonoscopy sooner   Right knee pain: going on for about 5 months, initially very tender and swollen, still feels unsteady, has mild effusion and a dull ache, she was seen at [facility] , had x-rays and was told she has OA, she was given Meloxicam, explained not safe since she had bariatric surgery   Senile purpura: stable on arms, reassurance given   Patient Active Problem List   Diagnosis Date Noted  . PSVT (paroxysmal supraventricular tachycardia) (HCC) 05/03/2018  . PVC (premature ventricular contraction) 05/03/2018  . PAC (premature atrial contraction) 05/03/2018  . Palpitations 02/04/2018  . Morbid obesity (HCC) 02/04/2018  . 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 Morbid obesity with BMI of 40.0-44.9, adult (HCC) 02/12/2017  . Insulin resistance 08/11/2016  . Senile purpura (HCC) 08/11/2016  . Facet arthropathy, lumbar 02/02/2016  . Spondylisthesis 01/12/2016  .  Chronic bilateral low back pain 11/01/2015  . Iron deficiency anemia 09/04/2014  . Hyperglycemia 09/04/2014  . Perioral dermatitis 09/04/2014  . 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 B12 deficiency 07/02/2014  . Cervical pain 07/02/2014  . Arthralgia of lower leg 07/02/2014  . Insomnia 07/02/2014  . 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 Dyslipidemia 07/02/2014  . Dermatitis,  eczematoid 07/02/2014  . Genital herpes in women 07/02/2014  . Gastric reflux 07/02/2014  . Bariatric surgery status 07/02/2014  . Herpes simplex 07/02/2014  . H/O: pneumonia 07/02/2014  . Eczema intertrigo 07/02/2014  . Adult BMI 30+ 07/02/2014  . OP (osteoporosis) 07/02/2014  . 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 Perennial allergic rhinitis with seasonal variation 07/02/2014  . Basal cell papilloma 07/02/2014  . Vitamin D deficiency 07/02/2014    Past Surgical History:  Procedure Laterality Date  . ABDOMINAL HYSTERECTOMY  1994  . CHOLECYSTECTOMY  2000  . EYE SURGERY  2000   Cataract Removal  . EYE SURGERY  2002   Cataract Removal  . GASTRIC BYPASS  2000   Mini  . GASTRIC BYPASS  2010    Family History  Problem Relation Age of Onset  . Dementia Mother   . COPD Mother   . Osteoporosis Mother   . Diabetes Father   . Glaucoma Father   . Parkinson's disease Father   . Glaucoma Brother   . Heart Problems Brother   . Heart attack Brother 52  . Brain cancer Daughter        Brain Tumor  . Hypothyroidism Daughter   . Cancer Daughter   . Heart disease Maternal Aunt   . Heart disease Maternal Uncle     Social History   Tobacco Use  . Smoking status: Former Smoker    Packs/day: 0.50    Years: 5.00    Pack years: 2.50    Types: Cigarettes    Start date: 01/23/1985    Quit date: 1992    Years since quitting: 29.8  . Smokeless tobacco: Never Used  Substance Use Topics  . Alcohol use: Not Currently    Comment: 3 drinks/year     Current Outpatient Medications:  .  Calcium Carb-Cholecalciferol (CALCIUM/VITAMIN D PO), Take by mouth daily., Disp: , Rfl:  .  cetirizine (ZYRTEC) 10 MG tablet, Take 10 mg by mouth daily as needed. , Disp: , Rfl:  .  Cyanocobalamin (B-12) 1000 MCG SUBL, Place 1 tablet under the tongue daily. Every other day, Disp: 30 tablet, Rfl: 0 .  cyclobenzaprine (FLEXERIL) 10 MG tablet, Take 1 tablet (10 mg total) by mouth at bedtime., Disp: 90 tablet, Rfl:  1 .  diclofenac Sodium (VOLTAREN) 1 % GEL, Voltaren 1 % topical gel, Disp: , Rfl:  .  escitalopram (LEXAPRO) 20 MG tablet, Take 1 tablet (20 mg total) by mouth daily., Disp: 90 tablet, Rfl: 1 .  ferrous sulfate 325 (65 FE) MG tablet, Take 1 tablet (325 mg total) by mouth daily with breakfast., Disp: 30 tablet, Rfl: 12 .  ketoconazole (NIZORAL) 2 % cream, APPLY TO RASH TWICE DAILY (Patient taking differently: APPLY TO RASH TWICE DAILY PRN), Disp: 90 g, Rfl: 0 .  levothyroxine (SYNTHROID) 25 MCG tablet, TAKE 1 TABLET BY MOUTH DAILY BEFORE BREAKFAST., Disp: 90 tablet, Rfl: 1 .  lisdexamfetamine (VYVANSE) 50 MG capsule, Take 1 capsule (50 mg total) by mouth daily., Disp: 30 capsule, Rfl: 0 .  lisdexamfetamine (VYVANSE) 50 MG capsule, Take 1 capsule (50 mg total) by mouth daily. Fill 10/03/2019,  Disp: 30 capsule, Rfl: 0 .  lisdexamfetamine (VYVANSE) 50 MG capsule, Take 1 capsule (50 mg total) by mouth daily., Disp: 30 capsule, Rfl: 0 .  PREMARIN vaginal cream, Place 1 Applicatorful vaginally 3 (three) times a week., Disp: 42.5 g, Rfl: 12 .  raloxifene (EVISTA) 60 MG tablet, Take 1 tablet (60 mg total) by mouth daily., Disp: 90 tablet, Rfl: 4 .  triamcinolone cream (KENALOG) 0.1 %, Apply 1 application topically 2 (two) times daily. Mix with lotion at home, Disp: 453.6 g, Rfl: 0 .  valACYclovir (VALTREX) 500 MG tablet, Take 1 tablet (500 mg total) by mouth 3 (three) times daily., Disp: 30 tablet, Rfl: 0 .  Vitamin D, Ergocalciferol, (DRISDOL) 1.25 MG (50000 UNIT) CAPS capsule, Take 1 capsule (50,000 Units total) by mouth every 7 (seven) days., Disp: 12 capsule, Rfl: 3  Allergies  Allergen Reactions  . Topamax  [Topiramate]     severe depression  . Trazodone     Other reaction(s): Dizziness    I personally reviewed active problem list, medication list, allergies, family history, social history, health maintenance, notes from last encounter with the patient/caregiver today.   ROS  Constitutional:  Negative for fever, positive for weight change.  Respiratory: Negative for cough and shortness of breath.   Cardiovascular: Negative for chest pain or palpitations.  Gastrointestinal: Negative for abdominal pain, no bowel changes.  Musculoskeletal: Negative for gait problem or joint swelling.  Skin: Negative for rash.  Neurological: Negative for dizziness or headache.  No other specific complaints in a complete review of systems (except as listed in HPI above).  Objective  Vitals:   12/09/19 0744  BP: 122/78  Pulse: 71  Resp: 14  SpO2: 99%  Weight: 193 lb 3.2 oz (87.6 kg)  Height: 5\' 2"  (1.575 m)    Body mass index is 35.34 kg/m.  Physical Exam  Constitutional: Patient appears well-developed and well-nourished. Obese  No distress.  HEENT: head atraumatic, normocephalic, pupils equal and reactive to light, neck supple Cardiovascular: Normal rate, regular rhythm and normal heart sounds.  No murmur heard. No BLE edema. Pulmonary/Chest: Effort normal and breath sounds normal. No respiratory distress. Abdominal: Soft.  There is no tenderness. Psychiatric: Patient has a normal mood and affect. behavior is normal. Judgment and thought content normal.  PHQ2/9: Depression screen [facility] 2/9 12/09/2019 12/09/2019 09/03/2019 06/03/2019 03/03/2019  Decreased Interest 0 0 0 0 0  Down, Depressed, Hopeless 0 0 0 1 0  PHQ - 2 Score 0 0 0 1 0  Altered sleeping 0 - 0 0 0  Tired, decreased energy 0 - 0 0 0  Change in appetite 0 - 0 0 0  Feeling bad or failure about yourself  0 - 0 0 0  Trouble concentrating 0 - 0 0 0  Moving slowly or fidgety/restless 0 - 0 0 0  Suicidal thoughts 0 - 0 0 0  PHQ-9 Score 0 - 0 1 0  Difficult doing work/chores Not difficult at all - - Not difficult at all -  Some recent data might be hidden    phq 9 is negative   Fall Risk: Fall Risk  12/09/2019 09/03/2019 06/03/2019 03/03/2019 08/27/2018  Falls in the past year? 0 0 0 0 0  Number falls in past yr: 0 0 0 0 0   Injury with Fall? 0 0 0 0 0  Follow up - - Falls evaluation completed - -     Functional Status Survey: Is the patient deaf or have  difficulty hearing?: No Does the patient have difficulty seeing, even when wearing glasses/contacts?: No Does the patient have difficulty concentrating, remembering, or making decisions?: No Does the patient have difficulty walking or climbing stairs?: No Does the patient have difficulty dressing or bathing?: No Does the patient have difficulty doing errands alone such as visiting a doctor's office or shopping?: No    Assessment & Plan  1. Acquired hypothyroidism  - levothyroxine (SYNTHROID) 25 MCG tablet; Take 1 tablet (25 mcg total) by mouth daily before breakfast.  Dispense: 90 tablet; Refill: 1  2. Bariatric surgery status  Doing well, advised her not to take Nsai's  3. Vitamin D deficiency  - Vitamin D, Ergocalciferol, (DRISDOL) 1.25 MG (50000 UNIT) CAPS capsule; Take 1 capsule (50,000 Units total) by mouth every 7 (seven) days.  Dispense: 12 capsule; Refill: 3  4. B12 deficiency  Doing well on supplementation   5. Mild recurrent major depression (HCC)  - escitalopram (LEXAPRO) 20 MG tablet; Take 1 tablet (20 mg total) by mouth daily.  Dispense: 90 tablet; Refill: 1 - lisdexamfetamine (VYVANSE) 50 MG capsule; Take 1 capsule (50 mg total) by mouth daily.  Dispense: 30 capsule; Refill: 0 - lisdexamfetamine (VYVANSE) 50 MG capsule; Take 1 capsule (50 mg total) by mouth daily. Fill 01/07/2020  Dispense: 30 capsule; Refill: 0 - lisdexamfetamine (VYVANSE) 50 MG capsule; Take 1 capsule (50 mg total) by mouth daily.  Dispense: 30 capsule; Refill: 0  6. Dyslipidemia   7. Senile purpura (HCC)  Reassurance given   8. PSVT (paroxysmal supraventricular tachycardia) (HCC)  Doing well now  9. Binge eating disorder  - lisdexamfetamine (VYVANSE) 50 MG capsule; Take 1 capsule (50 mg total) by mouth daily.  Dispense: 30 capsule; Refill: 0 -  lisdexamfetamine (VYVANSE) 50 MG capsule; Take 1 capsule (50 mg total) by mouth daily. Fill 01/07/2020  Dispense: 30 capsule; Refill: 0 - lisdexamfetamine (VYVANSE) 50 MG capsule; Take 1 capsule (50 mg total) by mouth daily.  Dispense: 30 capsule; Refill: 0  10. Major depression in remission (HCC)   11. Insulin resistance   12. Primary osteoarthritis of right knee  Advised to use topical medication   13. Osteoporosis without current pathological fracture, unspecified osteoporosis type  - raloxifene (EVISTA) 60 MG tablet; Take 1 tablet (60 mg total) by mouth daily.  Dispense: 90 tablet; Refill: 4  14. Morbid obesity (HCC)  BMI above 35 with co-morbidities, OA, dyslipidemia, pre-diabetes   15. Spasm of muscle, back  - cyclobenzaprine (FLEXERIL) 10 MG tablet; Take 1 tablet (10 mg total) by mouth at bedtime.  Dispense: 90 tablet; Refill: 1

## 2019-12-09 ENCOUNTER — Other Ambulatory Visit: Payer: Self-pay

## 2019-12-09 ENCOUNTER — Encounter: Payer: Self-pay | Admitting: Family Medicine

## 2019-12-09 ENCOUNTER — Ambulatory Visit: Payer: Federal, State, Local not specified - PPO | Admitting: Family Medicine

## 2019-12-09 VITALS — BP 122/78 | HR 71 | Temp 98.5°F | Resp 14 | Ht 62.0 in | Wt 193.2 lb

## 2019-12-09 DIAGNOSIS — F330 Major depressive disorder, recurrent, mild: Secondary | ICD-10-CM

## 2019-12-09 DIAGNOSIS — I471 Supraventricular tachycardia: Secondary | ICD-10-CM

## 2019-12-09 DIAGNOSIS — E559 Vitamin D deficiency, unspecified: Secondary | ICD-10-CM | POA: Diagnosis not present

## 2019-12-09 DIAGNOSIS — D692 Other nonthrombocytopenic purpura: Secondary | ICD-10-CM

## 2019-12-09 DIAGNOSIS — E8881 Metabolic syndrome: Secondary | ICD-10-CM

## 2019-12-09 DIAGNOSIS — E538 Deficiency of other specified B group vitamins: Secondary | ICD-10-CM

## 2019-12-09 DIAGNOSIS — M62830 Muscle spasm of back: Secondary | ICD-10-CM

## 2019-12-09 DIAGNOSIS — M1711 Unilateral primary osteoarthritis, right knee: Secondary | ICD-10-CM

## 2019-12-09 DIAGNOSIS — E039 Hypothyroidism, unspecified: Secondary | ICD-10-CM

## 2019-12-09 DIAGNOSIS — Z9884 Bariatric surgery status: Secondary | ICD-10-CM

## 2019-12-09 DIAGNOSIS — M810 Age-related osteoporosis without current pathological fracture: Secondary | ICD-10-CM

## 2019-12-09 DIAGNOSIS — F5081 Binge eating disorder: Secondary | ICD-10-CM

## 2019-12-09 DIAGNOSIS — E785 Hyperlipidemia, unspecified: Secondary | ICD-10-CM

## 2019-12-09 DIAGNOSIS — F325 Major depressive disorder, single episode, in full remission: Secondary | ICD-10-CM

## 2019-12-09 MED ORDER — VITAMIN D (ERGOCALCIFEROL) 1.25 MG (50000 UNIT) PO CAPS: 50000.0000 [IU] | ORAL_CAPSULE | ORAL | 3 refills | Status: DC

## 2019-12-09 MED ORDER — ESCITALOPRAM OXALATE 20 MG PO TABS: 20.0000 mg | ORAL_TABLET | Freq: Every day | ORAL | 1 refills | Status: DC

## 2019-12-09 MED ORDER — LISDEXAMFETAMINE DIMESYLATE 50 MG PO CAPS: 50.0000 mg | ORAL_CAPSULE | Freq: Every day | ORAL | 0 refills | Status: DC

## 2019-12-09 MED ORDER — RALOXIFENE HCL 60 MG PO TABS: 60.0000 mg | ORAL_TABLET | Freq: Every day | ORAL | 4 refills | Status: DC

## 2019-12-09 MED ORDER — CYCLOBENZAPRINE HCL 10 MG PO TABS: 10.0000 mg | ORAL_TABLET | Freq: Every day | ORAL | 1 refills | Status: DC

## 2019-12-09 MED ORDER — LEVOTHYROXINE SODIUM 25 MCG PO TABS: 25.0000 ug | ORAL_TABLET | Freq: Every day | ORAL | 1 refills | Status: DC

## 2019-12-10 ENCOUNTER — Ambulatory Visit
Admission: RE | Admit: 2019-12-10 | Discharge: 2019-12-10 | Disposition: A | Discharge status: Home / Self Care | Payer: Federal, State, Local not specified - PPO | Source: Ambulatory Visit | Attending: Family Medicine | Admitting: Family Medicine

## 2019-12-10 DIAGNOSIS — Z1231 Encounter for screening mammogram for malignant neoplasm of breast: Secondary | ICD-10-CM | POA: Diagnosis not present

## 2019-12-15 ENCOUNTER — Encounter: Payer: Self-pay | Admitting: Family Medicine

## 2020-02-18 ENCOUNTER — Other Ambulatory Visit: Payer: Self-pay | Admitting: Family Medicine

## 2020-02-18 DIAGNOSIS — F330 Major depressive disorder, recurrent, mild: Secondary | ICD-10-CM

## 2020-02-18 DIAGNOSIS — F5081 Binge eating disorder: Secondary | ICD-10-CM

## 2020-03-09 NOTE — Progress Notes (Unsigned)
Name: Terri Castillo   MRN: [id number]    DOB: 03/18/1950   Date:03/11/2020       Progress Note  Subjective  Chief Complaint  Follow up   HPI   Depression MildRecurrent:Ermalinda has failed Prozac in the past ( but wanted to go back since it was the one with the least amount of side effects) also failed Duloxetine , Wellbutrin and Pristiq ( worked but too expensive)She took Lexapro for a while but stopped because her daughter asked her to due to possible side effects.[name] sinceVyvansewasstarted because of binge eatingdisorder12/2018and she states it has helped her energy level. She asked to go up on the dose of Lexapro and since went to 20 mg . She has been doing well, she states back in January she had a crying spell at work - she got upset, but not on a regular basis.   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 elevated fasting glucose but normal A1C. She is on a healthy diet and likely will improve by the time we re  check it   B12 deficiency: sheistaking supplements a few times a week. Unchanged   Chronic back pain: She still had daily intermittent pain. Taking Flexeril for muscle spasms every nightand seems to help with symptoms, no radiculitis . She states a little worse this past week but usually well controlled   Rhythm change: she noticed her iwatchpicked up Afib back in September 2019 she saw Dr. Minus Breeding because of COVID-19 and is still being monitored,very seldom has palpitations, Vyvanse does not seem to cause palpitation . She has been feeling well. There was a history of SVT but doing well at this time  Hypothyroidism: normal TSH, continue current dose of levothyroxine, she denies hair loss, she has chronic dry skin. She states she has noticed some constipation because of change in diet, she has been taking Miralax to control symptoms  She is now having bowel movements almost daily and doing well   Right knee pain: going on for about one year  initially very tender and swollen, still feels unsteady, has mild effusion and a dull ache, she was seen at [facility] , had x-rays and was told she has OA, she was given Meloxicam but advised her not to take it, she states only taking it seldom She states weight loss has helped decrease the pain   Senile purpura: stable on arms, unchanged. Reassurance given  Patient Active Problem List   Diagnosis Date Noted  . PSVT (paroxysmal supraventricular tachycardia) (HCC) 05/03/2018  . PVC (premature ventricular contraction) 05/03/2018  . PAC (premature atrial contraction) 05/03/2018  . Palpitations 02/04/2018  . Morbid obesity (HCC) 02/04/2018  . 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 Insulin resistance 08/11/2016  . Senile purpura (HCC) 08/11/2016  . Facet arthropathy, lumbar 02/02/2016  . Spondylisthesis 01/12/2016  . Chronic bilateral low back pain 11/01/2015  . Iron deficiency anemia 09/04/2014  . Hyperglycemia  09/04/2014  . Perioral dermatitis 09/04/2014  . 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 B12 deficiency 07/02/2014  . Cervical pain 07/02/2014  . Arthralgia of lower leg 07/02/2014  . Insomnia 07/02/2014  . 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 Dyslipidemia 07/02/2014  . Dermatitis, eczematoid 07/02/2014  . Genital herpes in women 07/02/2014  . Gastric reflux 07/02/2014  . Bariatric surgery status 07/02/2014  . Herpes simplex 07/02/2014  . H/O: pneumonia 07/02/2014  . Eczema intertrigo 07/02/2014  . Adult BMI 30+ 07/02/2014  . OP (osteoporosis) 07/02/2014  . 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 Perennial allergic rhinitis with seasonal variation 07/02/2014  . Basal cell papilloma 07/02/2014  . Vitamin D deficiency 07/02/2014    Past Surgical History:  Procedure Laterality Date  . ABDOMINAL HYSTERECTOMY  1994  . CHOLECYSTECTOMY  2000  . EYE SURGERY  2000   Cataract Removal  . EYE SURGERY  2002   Cataract Removal  . GASTRIC BYPASS  2000   Mini  . GASTRIC BYPASS  2010    Family History  Problem Relation Age of Onset  . Dementia Mother   . COPD Mother   . Osteoporosis Mother   . Diabetes Father   . Glaucoma Father   . Parkinson's disease Father   . Glaucoma Brother   . Heart Problems Brother   . Heart attack Brother 52  . Brain cancer Daughter        Brain Tumor  . Hypothyroidism Daughter   . Cancer Daughter   . Heart disease Maternal Aunt   . Heart disease Maternal Uncle   . Breast cancer Neg Hx     Social History   Tobacco Use  . Smoking status: Former Smoker    Packs/day: 0.50    Years: 5.00    Pack years: 2.50    Types: Cigarettes    Start date: 01/23/1985    Quit date: 1992    Years since quitting: 30.1  . Smokeless tobacco: Never Used  Substance Use Topics  . Alcohol use: Not Currently    Comment: 3 drinks/year     Current Outpatient Medications:  .  Calcium Carb-Cholecalciferol (CALCIUM/VITAMIN D PO), Take by mouth daily., Disp: , Rfl:  .   cetirizine (ZYRTEC) 10 MG tablet, Take 10 mg by mouth daily as needed. , Disp: , Rfl:  .  cyclobenzaprine (FLEXERIL) 10 MG tablet, Take 1 tablet (10 mg total) by mouth at bedtime., Disp: 90 tablet, Rfl: 1 .  diclofenac Sodium (VOLTAREN) 1 % GEL, Voltaren 1 % topical gel, Disp: , Rfl:  .  escitalopram (LEXAPRO) 20 MG tablet, Take 1 tablet (20 mg total) by mouth daily., Disp: 90 tablet, Rfl: 1 .  ferrous sulfate 325 (65 FE) MG tablet, Take 1 tablet (325 mg total) by mouth daily with breakfast., Disp: 30 tablet, Rfl: 12 .  ketoconazole (NIZORAL) 2 % cream, APPLY TO RASH TWICE DAILY (Patient taking differently: APPLY TO RASH TWICE DAILY PRN), Disp: 90 g, Rfl: 0 .  levothyroxine (SYNTHROID) 25 MCG tablet, Take 1 tablet (25 mcg total) by mouth daily before breakfast., Disp: 90 tablet, Rfl: 1 .  meloxicam (MOBIC) 15 MG tablet, Take 15 mg by mouth daily., Disp: , Rfl:  .  PREMARIN vaginal cream, Place 1 Applicatorful vaginally 3 (three) times a week., Disp: 42.5 g, Rfl: 12 .  raloxifene (EVISTA) 60 MG tablet, Take 1 tablet (60 mg total) by mouth daily., Disp: 90 tablet, Rfl: 4 .  triamcinolone cream (KENALOG) 0.1 %, Apply 1 application topically 2 (two) times daily. Mix with lotion at home, Disp: 453.6 g, Rfl: 0 .  valACYclovir (VALTREX) 500 MG tablet, Take 1 tablet (500 mg total) by mouth 3 (three) times daily., Disp: 30 tablet, Rfl: 0 .  Vitamin D, Ergocalciferol, (DRISDOL) 1.25 MG (50000 UNIT) CAPS capsule, Take 1 capsule (50,000 Units total) by mouth every 7 (seven) days., Disp: 12 capsule, Rfl: 3 .  Cyanocobalamin (B-12) 1000 MCG SUBL, Place 1 tablet under the tongue daily. Every other day, Disp: 45 tablet, Rfl: 1 .  lisdexamfetamine (VYVANSE) 50 MG capsule, Take 1 capsule (50 mg total) by mouth daily., Disp: 30 capsule, Rfl: 0 .  lisdexamfetamine (VYVANSE) 50 MG capsule, Take 1 capsule (50 mg total) by mouth daily. Fill 04/08/2020, Disp: 30 capsule, Rfl: 0 .  lisdexamfetamine (VYVANSE) 50 MG capsule,  Take 1 capsule (50 mg total) by mouth daily., Disp: 30 capsule, Rfl: 0  Allergies  Allergen Reactions  . Topamax  [Topiramate]     severe depression  . Trazodone     Other reaction(s): Dizziness    I personally reviewed active problem list, medication list, allergies, family history, social history, health maintenance, notes from last encounter with the patient/caregiver today.   ROS  Constitutional: Negative for fever or weight change.  Respiratory: Negative for cough and shortness of breath.   Cardiovascular: Negative for chest pain or palpitations.  Gastrointestinal: Negative for abdominal pain, no bowel changes.  Musculoskeletal: Negative for gait problem , no longer having joint swelling.  Skin: Negative for rash.  Neurological: Negative for dizziness or headache.  No other specific complaints in a complete review of systems (except as listed in HPI above).  Objective  Vitals:   03/11/20 0749  BP: 122/84  Pulse: 92  Resp: 16  Temp: 98.2 F (36.8 C)  TempSrc: Oral  SpO2: 98%  Weight: 190 lb 12.8 oz (86.5 kg)    Body mass index is 34.9 kg/m.  Physical Exam  Constitutional: Patient appears well-developed and well-nourished. Overweight.   No distress.  HEENT: head atraumatic, normocephalic, pupils equal and reactive to light, neck supple, throat within normal limits Cardiovascular: Normal rate, regular rhythm and normal heart sounds.  No murmur heard. No BLE edema. Pulmonary/Chest: Effort normal and breath sounds normal. No respiratory distress. Abdominal: Soft.  There is no tenderness. Psychiatric: Patient has a normal mood and affect. behavior is normal. Judgment and thought content normal.  PHQ2/9: Depression screen [facility] 2/9 03/11/2020 12/09/2019 12/09/2019 09/03/2019 06/03/2019  Decreased Interest 0 0 0 0 0  Down, Depressed, Hopeless 0 0 0 0 1  PHQ - 2 Score 0 0 0 0 1  Altered sleeping 1 0 - 0 0  Tired, decreased energy 0 0 - 0 0  Change in appetite 1 0 - 0 0   Feeling bad or failure about yourself  0 0 - 0 0  Trouble concentrating 0 0 - 0 0  Moving slowly or fidgety/restless 0 0 - 0 0  Suicidal  thoughts 0 0 - 0 0  PHQ-9 Score 2 0 - 0 1  Difficult doing work/chores Not difficult at all Not difficult at all - - Not difficult at all  Some recent data might be hidden    phq 9 is negative   Fall Risk: Fall Risk  03/11/2020 12/09/2019 09/03/2019 06/03/2019 03/03/2019  Falls in the past year? 0 0 0 0 0  Number falls in past yr: 0 0 0 0 0  Injury with Fall? 0 0 0 0 0  Follow up Falls evaluation completed - - Falls evaluation completed -     Functional Status Survey: Is the patient deaf or have difficulty hearing?: No Does the patient have difficulty seeing, even when wearing glasses/contacts?: No Does the patient have difficulty concentrating, remembering, or making decisions?: No Does the patient have difficulty walking or climbing stairs?: No Does the patient have difficulty dressing or bathing?: No Does the patient have difficulty doing errands alone such as visiting a doctor's office or shopping?: No   Assessment & Plan  1. B12 deficiency  - Cyanocobalamin (B-12) 1000 MCG SUBL; Place 1 tablet under the tongue daily. Every other day  Dispense: 45 tablet; Refill: 1  2. Mild recurrent major depression (HCC)  - lisdexamfetamine (VYVANSE) 50 MG capsule; Take 1 capsule (50 mg total) by mouth daily.  Dispense: 30 capsule; Refill: 0 - lisdexamfetamine (VYVANSE) 50 MG capsule; Take 1 capsule (50 mg total) by mouth daily. Fill 04/08/2020  Dispense: 30 capsule; Refill: 0 - lisdexamfetamine (VYVANSE) 50 MG capsule; Take 1 capsule (50 mg total) by mouth daily.  Dispense: 30 capsule; Refill: 0  3. Binge eating disorder  - lisdexamfetamine (VYVANSE) 50 MG capsule; Take 1 capsule (50 mg total) by mouth daily.  Dispense: 30 capsule; Refill: 0 - lisdexamfetamine (VYVANSE) 50 MG capsule; Take 1 capsule (50 mg total) by mouth daily. Fill 04/08/2020   Dispense: 30 capsule; Refill: 0 - lisdexamfetamine (VYVANSE) 50 MG capsule; Take 1 capsule (50 mg total) by mouth daily.  Dispense: 30 capsule; Refill: 0  4. Vitamin D deficiency   5. Acquired hypothyroidism   6. Senile purpura (HCC)  Stable   7. Dyslipidemia   8. Primary osteoarthritis of right knee   9. Insulin resistance  Doing well with life style modifications

## 2020-03-11 ENCOUNTER — Other Ambulatory Visit: Payer: Self-pay

## 2020-03-11 ENCOUNTER — Encounter: Payer: Self-pay | Admitting: Family Medicine

## 2020-03-11 ENCOUNTER — Ambulatory Visit: Payer: Federal, State, Local not specified - PPO | Admitting: Family Medicine

## 2020-03-11 VITALS — BP 122/84 | HR 92 | Temp 98.2°F | Resp 16 | Ht 62.0 in | Wt 190.8 lb

## 2020-03-11 DIAGNOSIS — F5081 Binge eating disorder: Secondary | ICD-10-CM

## 2020-03-11 DIAGNOSIS — E8881 Metabolic syndrome: Secondary | ICD-10-CM

## 2020-03-11 DIAGNOSIS — E559 Vitamin D deficiency, unspecified: Secondary | ICD-10-CM

## 2020-03-11 DIAGNOSIS — D692 Other nonthrombocytopenic purpura: Secondary | ICD-10-CM

## 2020-03-11 DIAGNOSIS — E538 Deficiency of other specified B group vitamins: Secondary | ICD-10-CM

## 2020-03-11 DIAGNOSIS — E039 Hypothyroidism, unspecified: Secondary | ICD-10-CM

## 2020-03-11 DIAGNOSIS — F330 Major depressive disorder, recurrent, mild: Secondary | ICD-10-CM

## 2020-03-11 DIAGNOSIS — E785 Hyperlipidemia, unspecified: Secondary | ICD-10-CM

## 2020-03-11 DIAGNOSIS — M1711 Unilateral primary osteoarthritis, right knee: Secondary | ICD-10-CM

## 2020-03-11 MED ORDER — LISDEXAMFETAMINE DIMESYLATE 50 MG PO CAPS: 50.0000 mg | ORAL_CAPSULE | Freq: Every day | ORAL | 0 refills | Status: DC

## 2020-03-11 MED ORDER — B-12 1000 MCG SL SUBL: 1.0000 | SUBLINGUAL_TABLET | Freq: Every day | SUBLINGUAL | 1 refills | Status: AC

## 2020-06-14 NOTE — Progress Notes (Signed)
Name: Terri Castillo   MRN: [id number]    DOB: April 07, 1950   Date:06/15/2020       Progress Note  Subjective  Chief Complaint  Follow Up  HPI  Depression Mildin Remission:Little has tried and failed a lot of medications including Prozac,  Duloxetine , Wellbutrin and Pristiq ( worked but too expensive)She has been on Lexapro for a while and is doing well. .[name] sinceVyvansewasstarted because of binge eatingdisorder12/2018and she states it has helped her energy level. She feels well, in remission.  History of bariatric surgery: she had gastric bypass in 2010, her lowest weight was 180 lbs, but wasgradually gained weight since, weight is stable in the 220's lbs range, she started [employer] on June 28 th, 2021 at a weight of 221 lbs and is down to 177 lbs She states she is not losing as fast , explained that it happens when you reach a plateau  Eating snacks from [employer] 5 times a day and one meal a day - lean protein and green. She is doing great.   Bing eating disorder: Started on Vyvanse 10/2016 Weight was 226 lbs, itcurbed her appetite initially , however starting eat more , she was not very compliant with medication she takes three or four times a week Last weight in our office was prior to pandemic 01/2018 at 223 lbs, she started [employer] in June 2021 , starting weight ( prior to diet change ) was 221 lbs, today weight is down to 177 lbs. She is doing well.   Insulin Resistance: shehas polyphagia again but deniespolydipsia or polyuria. elevated fasting glucose but normal A1C. We will recheck next visit  B12 deficiency: sheistaking supplements a few times a week.Recheck next visit   Chronic back pain: She still had daily intermittent pain. Taking Flexeril for muscle spasms every nightand seems to help with symptoms, no radiculitis . She is doing well at this time  Rhythm change: she noticed her iwatchpicked up [employer] back in September 2019 she saw  Dr. Minus Breeding because of COVID-19 and is still being monitored,very seldom has palpitations, Vyvanse does not seem to cause palpitation . She has been feeling well. There was a history of SVT but doing well at this time  Hypothyroidism: normal TSH, continue current dose of levothyroxine, she denies hair loss, she has chronic dry skin. We will recheck labs next visit   Right knee pain: going on for about one year  initially very tender and swollen, still feels unsteady, has mild effusion and a dull ache, she was seen at [facility] , had x-rays and was told she has OA, she was given Meloxicam but advised her not to take it, she states only taking it seldom She states weight loss has helped decrease the pain , explained again risk of meloxicam use.   Senile purpura: stable on arms, unchanged. Unchanged   Patient Active Problem List   Diagnosis Date Noted  . PSVT (paroxysmal supraventricular tachycardia) (HCC) 05/03/2018  . PVC (premature ventricular contraction) 05/03/2018  . PAC (premature atrial contraction) 05/03/2018  . Palpitations 02/04/2018  . Morbid obesity (HCC) 02/04/2018  . 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 Insulin resistance 08/11/2016  . Senile purpura (HCC) 08/11/2016  . Facet arthropathy, lumbar 02/02/2016  . Spondylisthesis 01/12/2016  . Chronic bilateral low back pain 11/01/2015  . Iron deficiency anemia 09/04/2014  . Hyperglycemia 09/04/2014  . Perioral dermatitis 09/04/2014  . 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 B12 deficiency 07/02/2014  . Cervical  pain 07/02/2014  . Arthralgia of lower leg 07/02/2014  . Insomnia 07/02/2014  . 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 Dyslipidemia 07/02/2014  . Dermatitis, eczematoid 07/02/2014  . Genital herpes in women 07/02/2014  . Gastric reflux 07/02/2014  . Bariatric surgery status 07/02/2014  . Herpes simplex 07/02/2014  . H/O: pneumonia 07/02/2014  . Eczema intertrigo 07/02/2014   . Adult BMI 30+ 07/02/2014  . OP (osteoporosis) 07/02/2014  . 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 Perennial allergic rhinitis with seasonal variation 07/02/2014  . Basal cell papilloma 07/02/2014  . Vitamin D deficiency 07/02/2014    Past Surgical History:  Procedure Laterality Date  . ABDOMINAL HYSTERECTOMY  1994  . CHOLECYSTECTOMY  2000  . EYE SURGERY  2000   Cataract Removal  . EYE SURGERY  2002   Cataract Removal  . GASTRIC BYPASS  2000   Mini  . GASTRIC BYPASS  2010    Family History  Problem Relation Age of Onset  . Dementia Mother   . COPD Mother   . Osteoporosis Mother   . Diabetes Father   . Glaucoma Father   . Parkinson's disease Father   . Glaucoma Brother   . Heart Problems Brother   . Heart attack Brother 52  . Brain cancer Daughter        Brain Tumor  . Hypothyroidism Daughter   . Cancer Daughter   . Heart disease Maternal Aunt   . Heart disease Maternal Uncle   . Breast cancer Neg Hx     Social History   Tobacco Use  . Smoking status: Former Smoker    Packs/day: 0.50    Years: 5.00    Pack years: 2.50    Types: Cigarettes    Start date: 01/23/1985    Quit date: 1992    Years since quitting: 30.4  . Smokeless tobacco: Never Used  Substance Use Topics  . Alcohol use: Not Currently    Comment: 3 drinks/year     Current Outpatient Medications:  .  Calcium Carb-Cholecalciferol (CALCIUM/VITAMIN D PO), Take by mouth daily., Disp: , Rfl:  .  cetirizine (ZYRTEC) 10 MG tablet, Take 10 mg by mouth daily as needed. , Disp: , Rfl:  .  Cyanocobalamin (B-12) 1000 MCG SUBL, Place 1 tablet under the tongue daily. Every other day, Disp: 45 tablet, Rfl: 1 .  ferrous sulfate 325 (65 FE) MG tablet, Take 1 tablet (325 mg total) by mouth daily with breakfast., Disp: 30 tablet, Rfl: 12 .  ketoconazole (NIZORAL) 2 % cream, APPLY TO RASH TWICE DAILY (Patient taking differently: APPLY TO RASH TWICE DAILY PRN), Disp: 90 g, Rfl: 0 .  meloxicam (MOBIC) 15 MG tablet, Take  15 mg by mouth daily., Disp: , Rfl:  .  PREMARIN vaginal cream, Place 1 Applicatorful vaginally 3 (three) times a week., Disp: 42.5 g, Rfl: 12 .  raloxifene (EVISTA) 60 MG tablet, Take 1 tablet (60 mg total) by mouth daily., Disp: 90 tablet, Rfl: 4 .  triamcinolone cream (KENALOG) 0.1 %, Apply 1 application topically 2 (two) times daily. Mix with lotion at home, Disp: 453.6 g, Rfl: 0 .  valACYclovir (VALTREX) 500 MG tablet, Take 1 tablet (500 mg total) by mouth 3 (three) times daily., Disp: 30 tablet, Rfl: 0 .  Vitamin D, Ergocalciferol, (DRISDOL) 1.25 MG (50000 UNIT) CAPS capsule, Take 1 capsule (50,000 Units total) by mouth every 7 (seven) days., Disp: 12 capsule, Rfl: 3 .  cyclobenzaprine (FLEXERIL) 10 MG tablet, Take 1 tablet (10  mg total) by mouth at bedtime., Disp: 90 tablet, Rfl: 1 .  escitalopram (LEXAPRO) 20 MG tablet, Take 1 tablet (20 mg total) by mouth daily., Disp: 90 tablet, Rfl: 1 .  levothyroxine (SYNTHROID) 25 MCG tablet, Take 1 tablet (25 mcg total) by mouth daily before breakfast., Disp: 90 tablet, Rfl: 1 .  lisdexamfetamine (VYVANSE) 50 MG capsule, Take 1 capsule (50 mg total) by mouth daily., Disp: 30 capsule, Rfl: 0 .  lisdexamfetamine (VYVANSE) 50 MG capsule, Take 1 capsule (50 mg total) by mouth daily. Fill 07/15/2020, Disp: 30 capsule, Rfl: 0 .  lisdexamfetamine (VYVANSE) 50 MG capsule, Take 1 capsule (50 mg total) by mouth daily., Disp: 30 capsule, Rfl: 0  Allergies  Allergen Reactions  . Topamax  [Topiramate]     severe depression  . Trazodone     Other reaction(s): Dizziness    I personally reviewed active problem list, medication list, allergies, family history, social history, health maintenance with the patient/caregiver today.   ROS  Constitutional: Negative for fever, positive for  weight change.  Respiratory: Negative for cough and shortness of breath.   Cardiovascular: Negative for chest pain or palpitations.  Gastrointestinal: Negative for abdominal  pain, no bowel changes.  Musculoskeletal: Negative for gait problem or joint swelling.  Skin: Negative for rash.  Neurological: Negative for dizziness or headache.  No other specific complaints in a complete review of systems (except as listed in HPI above).  Objective  Vitals:   06/15/20 0741  BP: 112/80  Pulse: 88  Resp: 16  Temp: 98.3 F (36.8 C)  TempSrc: Oral  SpO2: 99%  Weight: 177 lb (80.3 kg)  Height: 5\' 2"  (1.575 m)    Body mass index is 32.37 kg/m.  Physical Exam  Constitutional: Patient appears well-developed and well-nourished. Obese  No distress.  HEENT: head atraumatic, normocephalic, pupils equal and reactive to light, neck supple Cardiovascular: Normal rate, regular rhythm and normal heart sounds.  No murmur heard. No BLE edema. Pulmonary/Chest: Effort normal and breath sounds normal. No respiratory distress. Abdominal: Soft.  There is no tenderness. Psychiatric: Patient has a normal mood and affect. behavior is normal. Judgment and thought content normal.  PHQ2/9: Depression screen [facility] 2/9 06/15/2020 03/11/2020 12/09/2019 12/09/2019 09/03/2019  Decreased Interest 0 0 0 0 0  Down, Depressed, Hopeless 0 0 0 0 0  PHQ - 2 Score 0 0 0 0 0  Altered sleeping 0 1 0 - 0  Tired, decreased energy 0 0 0 - 0  Change in appetite 0 1 0 - 0  Feeling bad or failure about yourself  0 0 0 - 0  Trouble concentrating 0 0 0 - 0  Moving slowly or fidgety/restless 0 0 0 - 0  Suicidal thoughts 0 0 0 - 0  PHQ-9 Score 0 2 0 - 0  Difficult doing work/chores - Not difficult at all Not difficult at all - -  Some recent data might be hidden    phq 9 is negative   Fall Risk: Fall Risk  06/15/2020 03/11/2020 12/09/2019 09/03/2019 06/03/2019  Falls in the past year? 0 0 0 0 0  Number falls in past yr: 0 0 0 0 0  Injury with Fall? 0 0 0 0 0  Follow up - Falls evaluation completed - - Falls evaluation completed     Functional Status Survey: Is the patient deaf or have difficulty  hearing?: No Does the patient have difficulty seeing, even when wearing glasses/contacts?: No Does the patient have  difficulty concentrating, remembering, or making decisions?: No Does the patient have difficulty walking or climbing stairs?: No Does the patient have difficulty dressing or bathing?: No Does the patient have difficulty doing errands alone such as visiting a doctor's office or shopping?: No   Assessment & Plan  1. Dyslipidemia  Recheck next visit   2. Bariatric surgery status   3. Acquired hypothyroidism  Last TSH was at goal   4. Binge eating disorder  - lisdexamfetamine (VYVANSE) 50 MG capsule; Take 1 capsule (50 mg total) by mouth daily.  Dispense: 30 capsule; Refill: 0 - lisdexamfetamine (VYVANSE) 50 MG capsule; Take 1 capsule (50 mg total) by mouth daily. Fill 07/15/2020  Dispense: 30 capsule; Refill: 0 - lisdexamfetamine (VYVANSE) 50 MG capsule; Take 1 capsule (50 mg total) by mouth daily.  Dispense: 30 capsule; Refill: 0  5. Senile purpura (HCC)  stable  6. Major depression in remission (HCC)  - escitalopram (LEXAPRO) 20 MG tablet; Take 1 tablet (20 mg total) by mouth daily.  Dispense: 90 tablet; Refill: 1 - lisdexamfetamine (VYVANSE) 50 MG capsule; Take 1 capsule (50 mg total) by mouth daily.  Dispense: 30 capsule; Refill: 0 - lisdexamfetamine (VYVANSE) 50 MG capsule; Take 1 capsule (50 mg total) by mouth daily. Fill 07/15/2020  Dispense: 30 capsule; Refill: 0 - lisdexamfetamine (VYVANSE) 50 MG capsule; Take 1 capsule (50 mg total) by mouth daily.  Dispense: 30 capsule; Refill: 0  7. Spasm of muscle, back  - cyclobenzaprine (FLEXERIL) 10 MG tablet; Take 1 tablet (10 mg total) by mouth at bedtime.  Dispense: 90 tablet; Refill: 1   8. Major depression in remission (HCC)  - escitalopram (LEXAPRO) 20 MG tablet; Take 1 tablet (20 mg total) by mouth daily.  Dispense: 90 tablet; Refill: 1 - lisdexamfetamine (VYVANSE) 50 MG capsule; Take 1 capsule (50 mg  total) by mouth daily.  Dispense: 30 capsule; Refill: 0 - lisdexamfetamine (VYVANSE) 50 MG capsule; Take 1 capsule (50 mg total) by mouth daily. Fill 07/15/2020  Dispense: 30 capsule; Refill: 0 - lisdexamfetamine (VYVANSE) 50 MG capsule; Take 1 capsule (50 mg total) by mouth daily.  Dispense: 30 capsule; Refill: 0  9. Spasm of muscle, back  - cyclobenzaprine (FLEXERIL) 10 MG tablet; Take 1 tablet (10 mg total) by mouth at bedtime.  Dispense: 90 tablet; Refill: 1  8. PSVT (paroxysmal supraventricular tachycardia) (HCC)   9. Primary osteoarthritis of right knee   10. Insulin resistance

## 2020-06-15 ENCOUNTER — Ambulatory Visit: Payer: Federal, State, Local not specified - PPO | Admitting: Family Medicine

## 2020-06-15 ENCOUNTER — Encounter: Payer: Self-pay | Admitting: Family Medicine

## 2020-06-15 ENCOUNTER — Other Ambulatory Visit: Payer: Self-pay

## 2020-06-15 VITALS — BP 112/80 | HR 88 | Temp 98.3°F | Resp 16 | Ht 62.0 in | Wt 177.0 lb

## 2020-06-15 DIAGNOSIS — F5081 Binge eating disorder: Secondary | ICD-10-CM

## 2020-06-15 DIAGNOSIS — I471 Supraventricular tachycardia: Secondary | ICD-10-CM

## 2020-06-15 DIAGNOSIS — E8881 Metabolic syndrome: Secondary | ICD-10-CM

## 2020-06-15 DIAGNOSIS — E039 Hypothyroidism, unspecified: Secondary | ICD-10-CM

## 2020-06-15 DIAGNOSIS — F325 Major depressive disorder, single episode, in full remission: Secondary | ICD-10-CM

## 2020-06-15 DIAGNOSIS — M62830 Muscle spasm of back: Secondary | ICD-10-CM

## 2020-06-15 DIAGNOSIS — Z9884 Bariatric surgery status: Secondary | ICD-10-CM | POA: Diagnosis not present

## 2020-06-15 DIAGNOSIS — E785 Hyperlipidemia, unspecified: Secondary | ICD-10-CM

## 2020-06-15 DIAGNOSIS — M1711 Unilateral primary osteoarthritis, right knee: Secondary | ICD-10-CM

## 2020-06-15 DIAGNOSIS — D692 Other nonthrombocytopenic purpura: Secondary | ICD-10-CM

## 2020-06-15 MED ORDER — LEVOTHYROXINE SODIUM 25 MCG PO TABS: 25.0000 ug | ORAL_TABLET | Freq: Every day | ORAL | 1 refills | Status: DC

## 2020-06-15 MED ORDER — ESCITALOPRAM OXALATE 20 MG PO TABS
20.0000 mg | ORAL_TABLET | Freq: Every day | ORAL | 1 refills | Status: DC
Start: 2020-06-15 — End: 2020-12-15

## 2020-06-15 MED ORDER — LISDEXAMFETAMINE DIMESYLATE 50 MG PO CAPS: 50.0000 mg | ORAL_CAPSULE | Freq: Every day | ORAL | 0 refills | Status: DC

## 2020-06-15 MED ORDER — CYCLOBENZAPRINE HCL 10 MG PO TABS: 10.0000 mg | ORAL_TABLET | Freq: Every day | ORAL | 1 refills | Status: DC

## 2020-07-14 ENCOUNTER — Other Ambulatory Visit: Payer: Self-pay | Admitting: Family Medicine

## 2020-07-14 DIAGNOSIS — E559 Vitamin D deficiency, unspecified: Secondary | ICD-10-CM

## 2020-07-14 NOTE — Telephone Encounter (Signed)
Last seen 5.24.2022 no upcoming appt sch'd

## 2020-07-14 NOTE — Telephone Encounter (Signed)
Requested medication (s) are due for refill today:yes  Requested medication (s) are on the active medication list: yes  Last refill:  12/09/19 #12 3 refills  Future visit scheduled: yes in 2 months   Notes to clinic:  not delegated per protocol     Requested Prescriptions  Pending Prescriptions Disp Refills   Vitamin D, Ergocalciferol, (DRISDOL) 1.25 MG (50000 UNIT) CAPS capsule [Pharmacy Med Name: VITAMIN D2 1.25MG (50,000 UNIT)] 12 capsule 3    Sig: Take 1 capsule (50,000 Units total) by mouth every 7 (seven) days.      Endocrinology:  Vitamins - Vitamin D Supplementation Failed - 07/14/2020 11:21 AM      Failed - 50,000 IU strengths are not delegated      Failed - Phosphate in normal range and within 360 days    No results found for: PHOS        Passed - Ca in normal range and within 360 days    Calcium  Date Value Ref Range Status  08/27/2019 9.2 8.6 - 10.4 mg/dL Final          Passed - Vitamin D in normal range and within 360 days    Vit D, 25-Hydroxy  Date Value Ref Range Status  08/27/2019 65 30 - 100 ng/mL Final    Comment:    Vitamin D Status         25-OH Vitamin D: . Deficiency:                    <20 ng/mL Insufficiency:             20 - 29 ng/mL Optimal:                 > or = 30 ng/mL . For 25-OH Vitamin D testing on patients on  D2-supplementation and patients for whom quantitation  of D2 and D3 fractions is required, the QuestAssureD(TM) 25-OH VIT D, (D2,D3), LC/MS/MS is recommended: order  code [id number] (patients >64yrs). See Note 1 . Note 1 . For additional information, please refer to  http://education.Quest[facility].com/faq/FAQ199  (This link is being provided for informational/ educational purposes only.)           Passed - Valid encounter within last 12 months    Recent Outpatient Visits           4 weeks ago Dyslipidemia   [facility] Alba Cory, MD   4 months ago Vitamin D deficiency   [facility] Alba Cory, MD   7 months ago Acquired hypothyroidism   [facility] Alba Cory, MD   10 months ago Binge eating disorder   [facility] Alba Cory, MD   1 year ago Acquired hypothyroidism   [facility] Alba Cory, MD       Future Appointments             In 2 months Alba Cory, MD [facility]   In 4 months Alba Cory, MD [facility]

## 2020-09-13 ENCOUNTER — Other Ambulatory Visit: Payer: Self-pay | Admitting: Family Medicine

## 2020-09-13 DIAGNOSIS — R739 Hyperglycemia, unspecified: Secondary | ICD-10-CM

## 2020-09-13 DIAGNOSIS — E538 Deficiency of other specified B group vitamins: Secondary | ICD-10-CM

## 2020-09-13 DIAGNOSIS — E039 Hypothyroidism, unspecified: Secondary | ICD-10-CM

## 2020-09-13 DIAGNOSIS — E785 Hyperlipidemia, unspecified: Secondary | ICD-10-CM

## 2020-09-13 DIAGNOSIS — E559 Vitamin D deficiency, unspecified: Secondary | ICD-10-CM

## 2020-09-13 DIAGNOSIS — E88819 Insulin resistance, unspecified: Secondary | ICD-10-CM

## 2020-09-13 DIAGNOSIS — E8881 Metabolic syndrome: Secondary | ICD-10-CM

## 2020-09-13 DIAGNOSIS — Z9884 Bariatric surgery status: Secondary | ICD-10-CM

## 2020-09-13 DIAGNOSIS — Z79899 Other long term (current) drug therapy: Secondary | ICD-10-CM

## 2020-09-14 LAB — COMPLETE METABOLIC PANEL WITH GFR
AG Ratio: 1.3 (calc) (ref 1.0–2.5)
ALT: 26 U/L (ref 6–29)
AST: 20 U/L (ref 10–35)
Albumin: 3.9 g/dL (ref 3.6–5.1)
Alkaline phosphatase (APISO): 66 U/L (ref 37–153)
BUN/Creatinine Ratio: 38 (calc) — ABNORMAL HIGH (ref 6–22)
BUN: 27 mg/dL — ABNORMAL HIGH (ref 7–25)
CO2: 32 mmol/L (ref 20–32)
Calcium: 9.4 mg/dL (ref 8.6–10.4)
Chloride: 105 mmol/L (ref 98–110)
Creat: 0.72 mg/dL (ref 0.50–1.05)
Globulin: 2.9 g/dL (calc) (ref 1.9–3.7)
Glucose, Bld: 92 mg/dL (ref 65–99)
Potassium: 5.1 mmol/L (ref 3.5–5.3)
Sodium: 141 mmol/L (ref 135–146)
Total Bilirubin: 0.8 mg/dL (ref 0.2–1.2)
Total Protein: 6.8 g/dL (ref 6.1–8.1)
eGFR: 91 mL/min/{1.73_m2} (ref >=60)

## 2020-09-14 LAB — THYROID PANEL WITH TSH
Free Thyroxine Index: 2.5 (ref 1.4–3.8)
T3 Uptake: 26 % (ref 22–35)
T4, Total: 9.6 ug/dL (ref 5.1–11.9)
TSH: 4.05 mIU/L (ref 0.40–4.50)

## 2020-09-14 LAB — CBC WITH DIFFERENTIAL/PLATELET
Absolute Monocytes: 431 cells/uL (ref 200–950)
Basophils Absolute: 41 cells/uL (ref 0–200)
Basophils Relative: 0.7 %
Eosinophils Absolute: 142 cells/uL (ref 15–500)
Eosinophils Relative: 2.4 %
HCT: 45.9 % — ABNORMAL HIGH (ref 35.0–45.0)
Hemoglobin: 14.8 g/dL (ref 11.7–15.5)
Lymphs Abs: 1440 cells/uL (ref 850–3900)
MCH: 30.6 pg (ref 27.0–33.0)
MCHC: 32.2 g/dL (ref 32.0–36.0)
MCV: 94.8 fL (ref 80.0–100.0)
MPV: 10.1 fL (ref 7.5–12.5)
Monocytes Relative: 7.3 %
Neutro Abs: 3847 cells/uL (ref 1500–7800)
Neutrophils Relative %: 65.2 %
Platelets: 338 10*3/uL (ref 140–400)
RBC: 4.84 10*6/uL (ref 3.80–5.10)
RDW: 11.7 % (ref 11.0–15.0)
Total Lymphocyte: 24.4 %
WBC: 5.9 10*3/uL (ref 3.8–10.8)

## 2020-09-14 LAB — HEMOGLOBIN A1C
Hgb A1c MFr Bld: 5.1 % of total Hgb (ref <5.7)
Mean Plasma Glucose: 100 mg/dL
eAG (mmol/L): 5.5 mmol/L

## 2020-09-14 LAB — LIPID PANEL
Cholesterol: 142 mg/dL (ref <200)
HDL: 44 mg/dL — ABNORMAL LOW (ref >=50)
LDL Cholesterol (Calc): 78 mg/dL (calc)
Non-HDL Cholesterol (Calc): 98 mg/dL (calc) (ref <130)
Total CHOL/HDL Ratio: 3.2 (calc) (ref <5.0)
Triglycerides: 115 mg/dL (ref <150)

## 2020-09-14 LAB — B12 AND FOLATE PANEL
Folate: 18.2 ng/mL
Vitamin B-12: 730 pg/mL (ref 200–1100)

## 2020-09-14 LAB — VITAMIN D 25 HYDROXY (VIT D DEFICIENCY, FRACTURES): Vit D, 25-Hydroxy: 70 ng/mL (ref 30–100)

## 2020-09-14 NOTE — Progress Notes (Signed)
Name: Terri Castillo   MRN: [id number]    DOB: 05-19-1950   Date:09/15/2020       Progress Note  Subjective  Chief Complaint  Follow Up  HPI  Depression Mild in Remission:  Terri Castillo has tried and failed a lot of medications including Prozac,  Duloxetine , Wellbutrin and Pristiq ( worked but too expensive) She has been on Lexapro for a while and is doing well. . She states since Vyvanse was started because of binge eating disorder 12/2016 and she states it has helped her energy level. She feels well, in remission.Continue current medications.    History of bariatric surgery: she had gastric bypass in 2010, her lowest weight was 180 lbs, but was gradually gained weight since, weight is stable in the 220's lbs range, she started [employer] on June 28 th, 2021 at a weight of 221 lbs and is down to 175 lbs She states she is not losing as fast , explained that it happens when you reach a plateau  Eating snacks from [employer] 5 times a day and one meal a day - lean protein and green. Weight is stable but trending down .    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  She  started [employer] in June 2021 , starting weight ( prior to diet change ) was 221 lbs, today weight is down to 175 lbs. She is doing well.    Insulin Resistance: she has polyphagia again but denies polydipsia or polyuria.  elevated fasting glucose but normal A1C. Continue life style modification   B12 deficiency: she is taking supplements a few times a week and level back to normal range.    Chronic back pain:  She still had daily intermittent pain. Taking Flexeril for muscle spasms every night and seems to help with symptoms, no radiculitis . She is doing well at this time   Rhythm change: she noticed her iwatch picked up Afib back in September 2019 she saw Dr. Saunders Revel had a holder monitor but did not show afib . No recent episodes of palpitation    Hypothyroidism: normal TSH, continue current dose of levothyroxine, she states stable hair loss, no change in bowel movement or denies dry skin   Right knee pain: going on for about one year  initially very tender and swollen, still feels unsteady, has mild effusion and a dull ache, she was seen at [facility] , had x-rays and was told she has OA, she was given Meloxicam but advised her not to take it, she states only taking it seldom She states weight loss has helped decrease the pain , she is only taking Meloxicam seldom, about once a month, advised to try Tylenol instead.   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   Insulin resistance 08/11/2016   Senile purpura ([place]) 08/11/2016   Facet arthropathy, lumbar 02/02/2016   Spondylisthesis 01/12/2016   Chronic bilateral low back pain 11/01/2015   Iron deficiency anemia 09/04/2014   Hyperglycemia 09/04/2014   Perioral dermatitis 09/04/2014   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   B12 deficiency 07/02/2014   Cervical pain 07/02/2014   Arthralgia of lower leg 07/02/2014  Insomnia 07/02/2014   Depression, major, recurrent, mild ([place]) 07/02/2014   Dyslipidemia 07/02/2014   Dermatitis, eczematoid 07/02/2014   Genital herpes in women 07/02/2014   Gastric reflux 07/02/2014   Bariatric surgery status 07/02/2014   Herpes simplex 07/02/2014   H/O: pneumonia 07/02/2014   Eczema intertrigo 07/02/2014   Adult BMI 30+ 07/02/2014   OP (osteoporosis) 07/02/2014   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   Perennial allergic rhinitis with seasonal variation 07/02/2014   Basal cell papilloma 07/02/2014   Vitamin D deficiency 07/02/2014    Past Surgical History:   Procedure Laterality Date   ABDOMINAL HYSTERECTOMY  1994   CHOLECYSTECTOMY  2000   EYE SURGERY  2000   Cataract Removal   EYE SURGERY  2002   Cataract Removal   GASTRIC BYPASS  2000   Mini   GASTRIC BYPASS  2010    Family History  Problem Relation Age of Onset   Dementia Mother    COPD Mother    Osteoporosis Mother    Diabetes Father    Glaucoma Father    Parkinson's disease Father    Glaucoma Brother    Heart Problems Brother    Heart attack Brother 79   Brain cancer Daughter        Brain Tumor   Hypothyroidism Daughter    Cancer Daughter    Heart disease Maternal Aunt    Heart disease Maternal Uncle    Breast cancer Neg Hx     Social History   Tobacco Use   Smoking status: Former    Packs/day: 0.50    Years: 5.00    Pack years: 2.50    Types: Cigarettes    Start date: 01/23/1985    Quit date: 1992    Years since quitting: 30.6   Smokeless tobacco: Never  Substance Use Topics   Alcohol use: Not Currently    Comment: 3 drinks/year     Current Outpatient Medications:    Calcium Carb-Cholecalciferol (CALCIUM/VITAMIN D PO), Take by mouth daily., Disp: , Rfl:    cetirizine (ZYRTEC) 10 MG tablet, Take 10 mg by mouth daily as needed. , Disp: , Rfl:    Cyanocobalamin (B-12) 1000 MCG SUBL, Place 1 tablet under the tongue daily. Every other day, Disp: 45 tablet, Rfl: 1   cyclobenzaprine (FLEXERIL) 10 MG tablet, Take 1 tablet (10 mg total) by mouth at bedtime., Disp: 90 tablet, Rfl: 1   escitalopram (LEXAPRO) 20 MG tablet, Take 1 tablet (20 mg total) by mouth daily., Disp: 90 tablet, Rfl: 1   ferrous sulfate 325 (65 FE) MG tablet, Take 1 tablet (325 mg total) by mouth daily with breakfast., Disp: 30 tablet, Rfl: 12   ketoconazole (NIZORAL) 2 % cream, APPLY TO RASH TWICE DAILY, Disp: 90 g, Rfl: 0   levothyroxine (SYNTHROID) 25 MCG tablet, Take 1 tablet (25 mcg total) by mouth daily before breakfast., Disp: 90 tablet, Rfl: 1   lisdexamfetamine (VYVANSE) 50 MG capsule,  Take 1 capsule (50 mg total) by mouth daily., Disp: 30 capsule, Rfl: 0   lisdexamfetamine (VYVANSE) 50 MG capsule, Take 1 capsule (50 mg total) by mouth daily. Fill 07/15/2020, Disp: 30 capsule, Rfl: 0   lisdexamfetamine (VYVANSE) 50 MG capsule, Take 1 capsule (50 mg total) by mouth daily., Disp: 30 capsule, Rfl: 0   meloxicam (MOBIC) 15 MG tablet, Take 15 mg by mouth daily., Disp: , Rfl:    PREMARIN vaginal cream, Place 1 Applicatorful vaginally 3 (three) times a week., Disp: 42.5 g, Rfl: 12  raloxifene (EVISTA) 60 MG tablet, Take 1 tablet (60 mg total) by mouth daily., Disp: 90 tablet, Rfl: 4   triamcinolone cream (KENALOG) 0.1 %, Apply 1 application topically 2 (two) times daily. Mix with lotion at home, Disp: 453.6 g, Rfl: 0   valACYclovir (VALTREX) 500 MG tablet, Take 1 tablet (500 mg total) by mouth 3 (three) times daily., Disp: 30 tablet, Rfl: 0   Vitamin D, Ergocalciferol, (DRISDOL) 1.25 MG (50000 UNIT) CAPS capsule, TAKE 1 CAPSULE (50,000 UNITS TOTAL) BY MOUTH EVERY 7 (SEVEN) DAYS., Disp: 12 capsule, Rfl: 3  Allergies  Allergen Reactions   Topamax  [Topiramate]     severe depression   Trazodone     Other reaction(s): Dizziness    I personally reviewed active problem list, medication list, allergies, family history, social history, health maintenance with the patient/caregiver today.   ROS  Constitutional: Negative for fever or weight change.  Respiratory: Negative for cough and shortness of breath.   Cardiovascular: Negative for chest pain or palpitations.  Gastrointestinal: Negative for abdominal pain, no bowel changes.  Musculoskeletal: Negative for gait problem or joint swelling.  Skin: Negative for rash.  Neurological: Negative for dizziness or headache.  No other specific complaints in a complete review of systems (except as listed in HPI above).   Objective  Vitals:   09/15/20 0738  BP: 110/78  Pulse: 92  Resp: 16  Temp: 98.2 F (36.8 C)  SpO2: 99%  Weight:  175 lb (79.4 kg)  Height: '5\' 2"'  (1.575 m)    Body mass index is 32.01 kg/m.  Physical Exam  Constitutional: Patient appears well-developed and well-nourished. Obese  No distress.  HEENT: head atraumatic, normocephalic, pupils equal and reactive to light, neck supple Cardiovascular: Normal rate, regular rhythm and normal heart sounds.  No murmur heard. No BLE edema. Pulmonary/Chest: Effort normal and breath sounds normal. No respiratory distress. Abdominal: Soft.  There is no tenderness. Psychiatric: Patient has a normal mood and affect. behavior is normal. Judgment and thought content normal.   Recent Results (from the past 2160 hour(s))  Lipid panel     Status: Abnormal   Collection Time: 09/13/20  8:05 AM  Result Value Ref Range   Cholesterol 142 <200 mg/dL   HDL 44 (L) > OR = 50 mg/dL   Triglycerides 115 <150 mg/dL   LDL Cholesterol (Calc) 78 mg/dL (calc)    Comment: Reference range: <100 . Desirable range <100 mg/dL for primary prevention;   <70 mg/dL for patients with CHD or diabetic patients  with > or = 2 CHD risk factors. Marland Kitchen LDL-C is now calculated using the Martin-Hopkins  calculation, which is a validated novel method providing  better accuracy than the Friedewald equation in the  estimation of LDL-C.  Cresenciano Genre et al. Annamaria Helling. 3710;626(94): 2061-2068  (http://education.[facility].com/faq/FAQ164)    Total CHOL/HDL Ratio 3.2 <5.0 (calc)   Non-HDL Cholesterol (Calc) 98 <130 mg/dL (calc)    Comment: For patients with diabetes plus 1 major ASCVD risk  factor, treating to a non-HDL-C goal of <100 mg/dL  (LDL-C of <70 mg/dL) is considered a therapeutic  option.   CBC with Differential/Platelet     Status: Abnormal   Collection Time: 09/13/20  8:05 AM  Result Value Ref Range   WBC 5.9 3.8 - 10.8 Thousand/uL   RBC 4.84 3.80 - 5.10 Million/uL   Hemoglobin 14.8 11.7 - 15.5 g/dL   HCT 45.9 (H) 35.0 - 45.0 %   MCV 94.8 80.0 - 100.0 fL  MCH 30.6 27.0 - 33.0 pg    MCHC 32.2 32.0 - 36.0 g/dL   RDW 11.7 11.0 - 15.0 %   Platelets 338 140 - 400 Thousand/uL   MPV 10.1 7.5 - 12.5 fL   Neutro Abs 3,847 1,500 - 7,800 cells/uL   Lymphs Abs 1,440 850 - 3,900 cells/uL   Absolute Monocytes 431 200 - 950 cells/uL   Eosinophils Absolute 142 15 - 500 cells/uL   Basophils Absolute 41 0 - 200 cells/uL   Neutrophils Relative % 65.2 %   Total Lymphocyte 24.4 %   Monocytes Relative 7.3 %   Eosinophils Relative 2.4 %   Basophils Relative 0.7 %  COMPLETE METABOLIC PANEL WITH GFR     Status: Abnormal   Collection Time: 09/13/20  8:05 AM  Result Value Ref Range   Glucose, Bld 92 65 - 99 mg/dL    Comment: .            Fasting reference interval .    BUN 27 (H) 7 - 25 mg/dL   Creat 0.72 0.50 - 1.05 mg/dL   eGFR 91 > OR = 60 mL/min/1.80m    Comment: The eGFR is based on the CKD-EPI 2021 equation. To calculate  the new eGFR from a previous Creatinine or Cystatin C result, go to https://www.kidney.org/professionals/ kdoqi/gfr%5Fcalculator    BUN/Creatinine Ratio 38 (H) 6 - 22 (calc)   Sodium 141 135 - 146 mmol/L   Potassium 5.1 3.5 - 5.3 mmol/L   Chloride 105 98 - 110 mmol/L   CO2 32 20 - 32 mmol/L   Calcium 9.4 8.6 - 10.4 mg/dL   Total Protein 6.8 6.1 - 8.1 g/dL   Albumin 3.9 3.6 - 5.1 g/dL   Globulin 2.9 1.9 - 3.7 g/dL (calc)   AG Ratio 1.3 1.0 - 2.5 (calc)   Total Bilirubin 0.8 0.2 - 1.2 mg/dL   Alkaline phosphatase (APISO) 66 37 - 153 U/L   AST 20 10 - 35 U/L   ALT 26 6 - 29 U/L  B12 and Folate Panel     Status: None   Collection Time: 09/13/20  8:05 AM  Result Value Ref Range   Vitamin B-12 730 200 - 1,100 pg/mL   Folate 18.2 ng/mL    Comment:                            Reference Range                            Low:           <3.4                            Borderline:    3.4-5.4                            Normal:        >5.4 .   Hemoglobin A1c     Status: None   Collection Time: 09/13/20  8:05 AM  Result Value Ref Range   Hgb A1c MFr Bld  5.1 <5.7 % of total Hgb    Comment: For the purpose of screening for the presence of diabetes: . <5.7%       Consistent with the absence of diabetes 5.7-6.4%    Consistent with increased risk for  diabetes             (prediabetes) > or =6.5%  Consistent with diabetes . This assay result is consistent with a decreased risk of diabetes. . Currently, no consensus exists regarding use of hemoglobin A1c for diagnosis of diabetes in children. . According to American Diabetes Association (ADA) guidelines, hemoglobin A1c <7.0% represents optimal control in non-pregnant diabetic patients. Different metrics may apply to specific patient populations.  Standards of Medical Care in Diabetes(ADA). .    Mean Plasma Glucose 100 mg/dL   eAG (mmol/L) 5.5 mmol/L  VITAMIN D 25 Hydroxy (Vit-D Deficiency, Fractures)     Status: None   Collection Time: 09/13/20  8:05 AM  Result Value Ref Range   Vit D, 25-Hydroxy 70 30 - 100 ng/mL    Comment: Vitamin D Status         25-OH Vitamin D: . Deficiency:                    <20 ng/mL Insufficiency:             20 - 29 ng/mL Optimal:                 > or = 30 ng/mL . For 25-OH Vitamin D testing on patients on  D2-supplementation and patients for whom quantitation  of D2 and D3 fractions is required, the QuestAssureD(TM) 25-OH VIT D, (D2,D3), LC/MS/MS is recommended: order  code [id number] (patients >70yr). See Note 1 . Note 1 . For additional information, please refer to  http://education.[facility].com/faq/FAQ199  (This link is being provided for informational/ educational purposes only.)   Thyroid Panel With TSH     Status: None   Collection Time: 09/13/20  8:05 AM  Result Value Ref Range   T3 Uptake 26 22 - 35 %   T4, Total 9.6 5.1 - 11.9 mcg/dL   Free Thyroxine Index 2.5 1.4 - 3.8   TSH 4.05 0.40 - 4.50 mIU/L     PHQ2/9: [facility] 09/15/2020 06/15/2020 03/11/2020 12/09/2019 12/09/2019  Decreased Interest 0 0 0 0 0   Down, Depressed, Hopeless 0 0 0 0 0  PHQ - 2 Score 0 0 0 0 0  Altered sleeping 0 0 1 0 -  Tired, decreased energy 0 0 0 0 -  Change in appetite 0 0 1 0 -  Feeling bad or failure about yourself  0 0 0 0 -  Trouble concentrating 0 0 0 0 -  Moving slowly or fidgety/restless 0 0 0 0 -  Suicidal thoughts 0 0 0 0 -  PHQ-9 Score 0 0 2 0 -  Difficult doing work/chores - - Not difficult at all Not difficult at all -  Some recent data might be hidden    phq 9 is negative   Fall Risk: Fall Risk  09/15/2020 06/15/2020 03/11/2020 12/09/2019 09/03/2019  Falls in the past year? 0 0 0 0 0  Number falls in past yr: 0 0 0 0 0  Injury with Fall? 0 0 0 0 0  Risk for fall due to : No Fall Risks - - - -  Follow up Falls prevention discussed - Falls evaluation completed - -      Functional Status Survey: Is the patient deaf or have difficulty hearing?: Yes Does the patient have difficulty seeing, even when wearing glasses/contacts?: No Does the patient have difficulty concentrating, remembering, or making decisions?: No Does the patient have difficulty walking or climbing stairs?:  No Does the patient have difficulty dressing or bathing?: No Does the patient have difficulty doing errands alone such as visiting a doctor's office or shopping?: No    Assessment & Plan  1. Dyslipidemia   2. Senile purpura ([place])   3. Bariatric surgery status   4. Vitamin D deficiency  - Vitamin D, Ergocalciferol, (DRISDOL) 1.25 MG (50000 UNIT) CAPS capsule; Take 1 capsule (50,000 Units total) by mouth every 14 (fourteen) days.  Dispense: 6 capsule; Refill: 1  5. Major depression in remission (HCC)  - lisdexamfetamine (VYVANSE) 50 MG capsule; Take 1 capsule (50 mg total) by mouth daily.  Dispense: 30 capsule; Refill: 0 - lisdexamfetamine (VYVANSE) 50 MG capsule; Take 1 capsule (50 mg total) by mouth daily.  Dispense: 30 capsule; Refill: 0 - lisdexamfetamine (VYVANSE) 50 MG capsule; Take 1 capsule (50 mg  total) by mouth daily.  Dispense: 30 capsule; Refill: 0  6. Acquired hypothyroidism   7. Insulin resistance   8. B12 deficiency   9. Hyperglycemia   10. Binge eating disorder  - lisdexamfetamine (VYVANSE) 50 MG capsule; Take 1 capsule (50 mg total) by mouth daily.  Dispense: 30 capsule; Refill: 0 - lisdexamfetamine (VYVANSE) 50 MG capsule; Take 1 capsule (50 mg total) by mouth daily.  Dispense: 30 capsule; Refill: 0 - lisdexamfetamine (VYVANSE) 50 MG capsule; Take 1 capsule (50 mg total) by mouth daily.  Dispense: 30 capsule; Refill: 0  11. PSVT (paroxysmal supraventricular tachycardia) (HCC)  Stable   12. Osteoporosis without current pathological fracture, unspecified osteoporosis type   13. Primary osteoarthritis of right knee

## 2020-09-15 ENCOUNTER — Ambulatory Visit: Payer: Federal, State, Local not specified - PPO | Admitting: Family Medicine

## 2020-09-15 ENCOUNTER — Encounter: Payer: Self-pay | Admitting: Family Medicine

## 2020-09-15 ENCOUNTER — Other Ambulatory Visit: Payer: Self-pay

## 2020-09-15 VITALS — BP 110/78 | HR 92 | Temp 98.2°F | Resp 16 | Ht 62.0 in | Wt 175.0 lb

## 2020-09-15 DIAGNOSIS — Z9884 Bariatric surgery status: Secondary | ICD-10-CM

## 2020-09-15 DIAGNOSIS — D692 Other nonthrombocytopenic purpura: Secondary | ICD-10-CM | POA: Diagnosis not present

## 2020-09-15 DIAGNOSIS — E785 Hyperlipidemia, unspecified: Secondary | ICD-10-CM | POA: Diagnosis not present

## 2020-09-15 DIAGNOSIS — I471 Supraventricular tachycardia, unspecified: Secondary | ICD-10-CM

## 2020-09-15 DIAGNOSIS — R739 Hyperglycemia, unspecified: Secondary | ICD-10-CM

## 2020-09-15 DIAGNOSIS — M810 Age-related osteoporosis without current pathological fracture: Secondary | ICD-10-CM

## 2020-09-15 DIAGNOSIS — F50819 Binge eating disorder, unspecified: Secondary | ICD-10-CM

## 2020-09-15 DIAGNOSIS — M1711 Unilateral primary osteoarthritis, right knee: Secondary | ICD-10-CM

## 2020-09-15 DIAGNOSIS — E039 Hypothyroidism, unspecified: Secondary | ICD-10-CM

## 2020-09-15 DIAGNOSIS — E8881 Metabolic syndrome: Secondary | ICD-10-CM

## 2020-09-15 DIAGNOSIS — E559 Vitamin D deficiency, unspecified: Secondary | ICD-10-CM

## 2020-09-15 DIAGNOSIS — F325 Major depressive disorder, single episode, in full remission: Secondary | ICD-10-CM

## 2020-09-15 DIAGNOSIS — E88819 Insulin resistance, unspecified: Secondary | ICD-10-CM

## 2020-09-15 DIAGNOSIS — F5081 Binge eating disorder: Secondary | ICD-10-CM

## 2020-09-15 DIAGNOSIS — E538 Deficiency of other specified B group vitamins: Secondary | ICD-10-CM

## 2020-09-15 MED ORDER — VITAMIN D (ERGOCALCIFEROL) 1.25 MG (50000 UNIT) PO CAPS: 50000.0000 [IU] | ORAL_CAPSULE | ORAL | 1 refills | Status: DC

## 2020-09-15 MED ORDER — LISDEXAMFETAMINE DIMESYLATE 50 MG PO CAPS: 50.0000 mg | ORAL_CAPSULE | Freq: Every day | ORAL | 0 refills | Status: DC

## 2020-11-01 ENCOUNTER — Encounter: Payer: Federal, State, Local not specified - PPO | Admitting: Family Medicine

## 2020-11-02 ENCOUNTER — Other Ambulatory Visit: Payer: Self-pay | Admitting: Family Medicine

## 2020-11-02 DIAGNOSIS — Z1231 Encounter for screening mammogram for malignant neoplasm of breast: Secondary | ICD-10-CM

## 2020-11-17 NOTE — Patient Instructions (Signed)
Preventive Care 69 Years and Older, Female Preventive care refers to lifestyle choices and visits with your health care provider that can promote health and wellness. This includes: A yearly physical exam. This is also called an annual wellness visit. Regular dental and eye exams. Immunizations. Screening for certain conditions. Healthy lifestyle choices, such as: Eating a healthy diet. Getting regular exercise. Not using drugs or products that contain nicotine and tobacco. Limiting alcohol use. What can I expect for my preventive care visit? Physical exam Your health care provider will check your: Height and weight. These may be used to calculate your BMI (body mass index). BMI is a measurement that tells if you are at a healthy weight. Heart rate and blood pressure. Body temperature. Skin for abnormal spots. Counseling Your health care provider may ask you questions about your: Past medical problems. Family's medical history. Alcohol, tobacco, and drug use. Emotional well-being. Home life and relationship well-being. Sexual activity. Diet, exercise, and sleep habits. History of falls. Memory and ability to understand (cognition). Work and work Statistician. Pregnancy and menstrual history. Access to firearms. What immunizations do I need? Vaccines are usually given at various ages, according to a schedule. Your health care provider will recommend vaccines for you based on your age, medical history, and lifestyle or other factors, such as travel or where you work. What tests do I need? Blood tests Lipid and cholesterol levels. These may be checked every 5 years, or more often depending on your overall health. Hepatitis C test. Hepatitis B test. Screening Lung cancer screening. You may have this screening every year starting at age 69 if you have a 30-pack-year history of smoking and currently smoke or have quit within the past 15 years. Colorectal cancer screening. All  adults should have this screening starting at age 69 and continuing until age 9. Your health care provider may recommend screening at age 69 if you are at increased risk. You will have tests every 1-10 years, depending on your results and the type of screening test. Diabetes screening. This is done by checking your blood sugar (glucose) after you have not eaten for a while (fasting). You may have this done every 1-3 years. Mammogram. This may be done every 1-2 years. Talk with your health care provider about how often you should have regular mammograms. Abdominal aortic aneurysm (AAA) screening. You may need this if you are a current or former smoker. BRCA-related cancer screening. This may be done if you have a family history of breast, ovarian, tubal, or peritoneal cancers. Other tests STD (sexually transmitted disease) testing, if you are at risk. Bone density scan. This is done to screen for osteoporosis. You may have this done starting at age 69. Talk with your health care provider about your test results, treatment options, and if necessary, the need for more tests. Follow these instructions at home: Eating and drinking  Eat a diet that includes fresh fruits and vegetables, whole grains, lean protein, and low-fat dairy products. Limit your intake of foods with high amounts of sugar, saturated fats, and salt. Take vitamin and mineral supplements as recommended by your health care provider. Do not drink alcohol if your health care provider tells you not to drink. If you drink alcohol: Limit how much you have to 0-1 drink a day. Be aware of how much alcohol is in your drink. In the [place] one drink equals one 12 oz bottle of beer (355 mL), one 5 oz glass of wine (148 mL), or one  1 oz glass of hard liquor (44 mL). Lifestyle Take daily care of your teeth and gums. Brush your teeth every morning and night with fluoride toothpaste. Floss one time each day. Stay active. Exercise for at least  30 minutes 5 or more days each week. Do not use any products that contain nicotine or tobacco, such as cigarettes, e-cigarettes, and chewing tobacco. If you need help quitting, ask your health care provider. Do not use drugs. If you are sexually active, practice safe sex. Use a condom or other form of protection in order to prevent STIs (sexually transmitted infections). Talk with your health care provider about taking a low-dose aspirin or statin. Find healthy ways to cope with stress, such as: Meditation, yoga, or listening to music. Journaling. Talking to a trusted person. Spending time with friends and family. Safety Always wear your seat belt while driving or riding in a vehicle. Do not drive: If you have been drinking alcohol. Do not ride with someone who has been drinking. When you are tired or distracted. While texting. Wear a helmet and other protective equipment during sports activities. If you have firearms in your house, make sure you follow all gun safety procedures. What's next? Visit your health care provider once a year for an annual wellness visit. Ask your health care provider how often you should have your eyes and teeth checked. Stay up to date on all vaccines. This information is not intended to replace advice given to you by your health care provider. Make sure you discuss any questions you have with your health care provider. Document Revised: 03/19/2020 Document Reviewed: 01/03/2018 Elsevier Patient Education  2022 [employer].

## 2020-11-17 NOTE — Progress Notes (Signed)
Name: Terri Castillo   MRN: [id number]    DOB: Feb 05, 1950   Date:11/18/2020       Progress Note  Subjective  Chief Complaint  Annual Exam  HPI  Patient presents for annual CPE.  Diet: balanced  Exercise: discussed importance of increase in physical activity    [place] Office Visit from 03/11/2020 in [facility]  AUDIT-C Score 0      Depression: Phq 9 is  negative Depression screen [facility] 2/9 11/18/2020 09/15/2020 06/15/2020 03/11/2020 12/09/2019  Decreased Interest 0 0 0 0 0  Down, Depressed, Hopeless 0 0 0 0 0  PHQ - 2 Score 0 0 0 0 0  Altered sleeping 0 0 0 1 0  Tired, decreased energy 0 0 0 0 0  Change in appetite 0 0 0 1 0  Feeling bad or failure about yourself  0 0 0 0 0  Trouble concentrating 0 0 0 0 0  Moving slowly or fidgety/restless 0 0 0 0 0  Suicidal thoughts 0 0 0 0 0  PHQ-9 Score 0 0 0 2 0  Difficult doing work/chores - - - Not difficult at all Not difficult at all  Some recent data might be hidden   Hypertension: BP Readings from Last 3 Encounters:  11/18/20 110/72  09/15/20 110/78  06/15/20 112/80   Obesity: Wt Readings from Last 3 Encounters:  11/18/20 177 lb (80.3 kg)  09/15/20 175 lb (79.4 kg)  06/15/20 177 lb (80.3 kg)   BMI Readings from Last 3 Encounters:  11/18/20 32.37 kg/m  09/15/20 32.01 kg/m  06/15/20 32.37 kg/m     Vaccines:   Shingrix: up to date Pneumonia: up to date  Flu: up to date  COVID-66: up to date   Hep C Screening: 12/11/11 STD testing and prevention (HIV/chl/gon/syphilis): N/A Intimate partner violence: negative Sexual History :not sexually active  Menstrual History/LMP/Abnormal Bleeding:s/p hysterectomy  Incontinence Symptoms: no problems   Breast cancer:  - Last Mammogram: Scheduled 12/10/20 - BRCA gene screening: N/A  Osteoporosis: Discussed high calcium and vitamin D supplementation, weight bearing exercises  Cervical cancer screening: N/A  Skin cancer: Discussed  monitoring for atypical lesions  Colorectal cancer: 07/31/12   Lung cancer: Low Dose CT Chest recommended if Age 48-80 years, 20 pack-year currently smoking OR have quit w/in 15years. Patient does not qualify.   ECG: 02/04/18  Advanced Care Planning: A voluntary discussion about advance care planning including the explanation and discussion of advance directives.  Discussed health care proxy and Living will, and the patient was able to identify a health care proxy as daughter Seth Bake   Lipids: Lab Results  Component Value Date   CHOL 142 09/13/2020   CHOL 139 08/27/2019   CHOL 199 08/22/2018   Lab Results  Component Value Date   HDL 44 (L) 09/13/2020   HDL 41 (L) 08/27/2019   HDL 49 (L) 08/22/2018   Lab Results  Component Value Date   LDLCALC 78 09/13/2020   LDLCALC 80 08/27/2019   LDLCALC 122 (H) 08/22/2018   Lab Results  Component Value Date   TRIG 115 09/13/2020   TRIG 96 08/27/2019   TRIG 165 (H) 08/22/2018   Lab Results  Component Value Date   CHOLHDL 3.2 09/13/2020   CHOLHDL 3.4 08/27/2019   CHOLHDL 4.1 08/22/2018   No results found for: LDLDIRECT  Glucose: Glucose, Bld  Date Value Ref Range Status  09/13/2020 92 65 - 99 mg/dL Final    Comment:    .  Fasting reference interval .   08/27/2019 95 65 - 99 mg/dL Final    Comment:    .            Fasting reference interval .   08/22/2018 107 (H) 65 - 99 mg/dL Final    Comment:    .            Fasting reference interval . For someone without known diabetes, a glucose value between 100 and 125 mg/dL is consistent with prediabetes and should be confirmed with a follow-up test. .     Patient Active Problem List   Diagnosis Date Noted   PSVT (paroxysmal supraventricular tachycardia) ([place]) 05/03/2018   PVC (premature ventricular contraction) 05/03/2018   PAC (premature atrial contraction) 05/03/2018   Palpitations 02/04/2018   Inflammatory spondylopathy of lumbar region ([place]) 05/22/2017    Insulin resistance 08/11/2016   Senile purpura ([place]) 08/11/2016   Facet arthropathy, lumbar 02/02/2016   Spondylisthesis 01/12/2016   Chronic bilateral low back pain 11/01/2015   Iron deficiency anemia 09/04/2014   Hyperglycemia 09/04/2014   Perioral dermatitis 09/04/2014   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   B12 deficiency 07/02/2014   Cervical pain 07/02/2014   Arthralgia of lower leg 07/02/2014   Insomnia 07/02/2014   Depression, major, recurrent, mild ([place]) 07/02/2014   Dyslipidemia 07/02/2014   Dermatitis, eczematoid 07/02/2014   Genital herpes in women 07/02/2014   Gastric reflux 07/02/2014   Bariatric surgery status 07/02/2014   Herpes simplex 07/02/2014   H/O: pneumonia 07/02/2014   Eczema intertrigo 07/02/2014   Adult BMI 30+ 07/02/2014   OP (osteoporosis) 07/02/2014   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   Perennial allergic rhinitis with seasonal variation 07/02/2014   Basal cell papilloma 07/02/2014   Vitamin D deficiency 07/02/2014    Past Surgical History:  Procedure Laterality Date   ABDOMINAL HYSTERECTOMY  1994   CHOLECYSTECTOMY  2000   EYE SURGERY  2000   Cataract Removal   EYE SURGERY  2002   Cataract Removal   GASTRIC BYPASS  2000   Mini   GASTRIC BYPASS  2010    Family History  Problem Relation Age of Onset   Dementia Mother    COPD Mother    Osteoporosis Mother    Diabetes Father    Glaucoma Father    Parkinson's disease Father    Glaucoma Brother    Heart Problems Brother    Heart attack Brother 77   Brain cancer Daughter        Brain Tumor   Hypothyroidism Daughter    Cancer Daughter    Heart disease Maternal Aunt    Heart disease Maternal Uncle    Breast cancer Neg Hx     Social History   Socioeconomic History   Marital status: Divorced    Spouse name: Not on file   Number of children: 2   Years of education: Not on file   Highest education level: Some college, no degree  Occupational History   Not on file  Tobacco Use   Smoking status:  Former    Packs/day: 0.50    Years: 5.00    Pack years: 2.50    Types: Cigarettes    Start date: 01/23/1985    Quit date: 1992    Years since quitting: 30.8   Smokeless tobacco: Never  Vaping Use   Vaping Use: Never used  Substance and Sexual Activity   Alcohol use: Not Currently    Comment: 3 drinks/year   Drug use: No  Sexual activity: Not Currently    Partners: Male  Other Topics Concern   Not on file  Social History Narrative   Not on file   Social Determinants of Health   Financial Resource Strain: Low Risk    Difficulty of Paying Living Expenses: Not hard at all  Food Insecurity: No Food Insecurity   Worried About Running Out of Food in the Last Year: Never true   Ran Out of Food in the Last Year: Never true  Transportation Needs: No Transportation Needs   Lack of Transportation (Medical): No   Lack of Transportation (Non-Medical): No  Physical Activity: Inactive   Days of Exercise per Week: 0 days   Minutes of Exercise per Session: 0 min  Stress: No Stress Concern Present   Feeling of Stress : Not at all  Social Connections: Socially Isolated   Frequency of Communication with Friends and Family: More than three times a week   Frequency of Social Gatherings with Friends and Family: Once a week   Attends Religious Services: Never   Active Member of Clubs or Organizations: No   Attends Club or Organization Meetings: Never   Marital Status: Divorced  Intimate Partner Violence: Not At Risk   Fear of Current or Ex-Partner: No   Emotionally Abused: No   Physically Abused: No   Sexually Abused: No     Current Outpatient Medications:    Calcium Carb-Cholecalciferol (CALCIUM/VITAMIN D PO), Take by mouth daily., Disp: , Rfl:    cetirizine (ZYRTEC) 10 MG tablet, Take 10 mg by mouth daily as needed. , Disp: , Rfl:    Cyanocobalamin (B-12) 1000 MCG SUBL, Place 1 tablet under the tongue daily. Every other day, Disp: 45 tablet, Rfl: 1   cyclobenzaprine (FLEXERIL) 10 MG  tablet, Take 1 tablet (10 mg total) by mouth at bedtime., Disp: 90 tablet, Rfl: 1   escitalopram (LEXAPRO) 20 MG tablet, Take 1 tablet (20 mg total) by mouth daily., Disp: 90 tablet, Rfl: 1   ferrous sulfate 325 (65 FE) MG tablet, Take 1 tablet (325 mg total) by mouth daily with breakfast., Disp: 30 tablet, Rfl: 12   ketoconazole (NIZORAL) 2 % cream, APPLY TO RASH TWICE DAILY, Disp: 90 g, Rfl: 0   levothyroxine (SYNTHROID) 25 MCG tablet, Take 1 tablet (25 mcg total) by mouth daily before breakfast., Disp: 90 tablet, Rfl: 1   lisdexamfetamine (VYVANSE) 50 MG capsule, Take 1 capsule (50 mg total) by mouth daily., Disp: 30 capsule, Rfl: 0   lisdexamfetamine (VYVANSE) 50 MG capsule, Take 1 capsule (50 mg total) by mouth daily., Disp: 30 capsule, Rfl: 0   lisdexamfetamine (VYVANSE) 50 MG capsule, Take 1 capsule (50 mg total) by mouth daily., Disp: 30 capsule, Rfl: 0   meloxicam (MOBIC) 15 MG tablet, Take 15 mg by mouth daily., Disp: , Rfl:    PREMARIN vaginal cream, Place 1 Applicatorful vaginally 3 (three) times a week., Disp: 42.5 g, Rfl: 12   raloxifene (EVISTA) 60 MG tablet, Take 1 tablet (60 mg total) by mouth daily., Disp: 90 tablet, Rfl: 4   triamcinolone cream (KENALOG) 0.1 %, Apply 1 application topically 2 (two) times daily. Mix with lotion at home, Disp: 453.6 g, Rfl: 0   valACYclovir (VALTREX) 500 MG tablet, Take 1 tablet (500 mg total) by mouth 3 (three) times daily., Disp: 30 tablet, Rfl: 0   Vitamin D, Ergocalciferol, (DRISDOL) 1.25 MG (50000 UNIT) CAPS capsule, Take 1 capsule (50,000 Units total) by mouth every 14 (fourteen) days., Disp:   6 capsule, Rfl: 1  Allergies  Allergen Reactions   Topamax  [Topiramate]     severe depression   Trazodone     Other reaction(s): Dizziness     ROS  Constitutional: Negative for fever or weight change.  Respiratory: Negative for cough and shortness of breath.   Cardiovascular: Negative for chest pain or palpitations.  Gastrointestinal:  Negative for abdominal pain, no bowel changes.  Musculoskeletal: Negative for gait problem or joint swelling.  Skin: Negative for rash.  Neurological: Negative for dizziness or headache.  No other specific complaints in a complete review of systems (except as listed in HPI above).   Objective  Vitals:   11/18/20 0750  BP: 110/72  Pulse: 91  Resp: 16  Temp: 98.1 F (36.7 C)  SpO2: 97%  Weight: 177 lb (80.3 kg)  Height: 5' 2" (1.575 m)    Body mass index is 32.37 kg/m.  Physical Exam  Constitutional: Patient appears well-developed and well-nourished. No distress.  HENT: Head: Normocephalic and atraumatic. Ears: B TMs ok, no erythema or effusion; Nose: Not done . Mouth/Throat: not done  Eyes: Conjunctivae and EOM are normal. Pupils are equal, round, and reactive to light. No scleral icterus.  Neck: Normal range of motion. Neck supple. No JVD present. No thyromegaly present.  Cardiovascular: Normal rate, regular rhythm and normal heart sounds.  No murmur heard. No BLE edema. Pulmonary/Chest: Effort normal and breath sounds normal. No respiratory distress. Abdominal: Soft. Bowel sounds are normal, no distension. There is no tenderness. no masses Breast: patient refused  FEMALE GENITALIA:  Offered but not interested  RECTAL: refused  Musculoskeletal: Normal range of motion, no joint effusions. No gross deformities Neurological: he is alert and oriented to person, place, and time. No cranial nerve deficit. Coordination, balance, strength, speech and gait are normal.  Skin: Skin is warm and dry. No rash noted. No erythema.  Psychiatric: Patient has a normal mood and affect. behavior is normal. Judgment and thought content normal.   Recent Results (from the past 2160 hour(s))  Lipid panel     Status: Abnormal   Collection Time: 09/13/20  8:05 AM  Result Value Ref Range   Cholesterol 142 <200 mg/dL   HDL 44 (L) > OR = 50 mg/dL   Triglycerides 115 <150 mg/dL   LDL Cholesterol  (Calc) 78 mg/dL (calc)    Comment: Reference range: <100 . Desirable range <100 mg/dL for primary prevention;   <70 mg/dL for patients with CHD or diabetic patients  with > or = 2 CHD risk factors. . LDL-C is now calculated using the Martin-Hopkins  calculation, which is a validated novel method providing  better accuracy than the Friedewald equation in the  estimation of LDL-C.  Martin SS et al. JAMA. 2013;310(19): 2061-2068  (http://education.[facility].com/faq/FAQ164)    Total CHOL/HDL Ratio 3.2 <5.0 (calc)   Non-HDL Cholesterol (Calc) 98 <130 mg/dL (calc)    Comment: For patients with diabetes plus 1 major ASCVD risk  factor, treating to a non-HDL-C goal of <100 mg/dL  (LDL-C of <70 mg/dL) is considered a therapeutic  option.   CBC with Differential/Platelet     Status: Abnormal   Collection Time: 09/13/20  8:05 AM  Result Value Ref Range   WBC 5.9 3.8 - 10.8 Thousand/uL   RBC 4.84 3.80 - 5.10 Million/uL   Hemoglobin 14.8 11.7 - 15.5 g/dL   HCT 45.9 (H) 35.0 - 45.0 %   MCV 94.8 80.0 - 100.0 fL   MCH 30.6 27.0 -   33.0 pg   MCHC 32.2 32.0 - 36.0 g/dL   RDW 11.7 11.0 - 15.0 %   Platelets 338 140 - 400 Thousand/uL   MPV 10.1 7.5 - 12.5 fL   Neutro Abs 3,847 1,500 - 7,800 cells/uL   Lymphs Abs 1,440 850 - 3,900 cells/uL   Absolute Monocytes 431 200 - 950 cells/uL   Eosinophils Absolute 142 15 - 500 cells/uL   Basophils Absolute 41 0 - 200 cells/uL   Neutrophils Relative % 65.2 %   Total Lymphocyte 24.4 %   Monocytes Relative 7.3 %   Eosinophils Relative 2.4 %   Basophils Relative 0.7 %  COMPLETE METABOLIC PANEL WITH GFR     Status: Abnormal   Collection Time: 09/13/20  8:05 AM  Result Value Ref Range   Glucose, Bld 92 65 - 99 mg/dL    Comment: .            Fasting reference interval .    BUN 27 (H) 7 - 25 mg/dL   Creat 0.72 0.50 - 1.05 mg/dL   eGFR 91 > OR = 60 mL/min/1.73m2    Comment: The eGFR is based on the CKD-EPI 2021 equation. To calculate  the new  eGFR from a previous Creatinine or Cystatin C result, go to https://www.kidney.org/professionals/ kdoqi/gfr%5Fcalculator    BUN/Creatinine Ratio 38 (H) 6 - 22 (calc)   Sodium 141 135 - 146 mmol/L   Potassium 5.1 3.5 - 5.3 mmol/L   Chloride 105 98 - 110 mmol/L   CO2 32 20 - 32 mmol/L   Calcium 9.4 8.6 - 10.4 mg/dL   Total Protein 6.8 6.1 - 8.1 g/dL   Albumin 3.9 3.6 - 5.1 g/dL   Globulin 2.9 1.9 - 3.7 g/dL (calc)   AG Ratio 1.3 1.0 - 2.5 (calc)   Total Bilirubin 0.8 0.2 - 1.2 mg/dL   Alkaline phosphatase (APISO) 66 37 - 153 U/L   AST 20 10 - 35 U/L   ALT 26 6 - 29 U/L  B12 and Folate Panel     Status: None   Collection Time: 09/13/20  8:05 AM  Result Value Ref Range   Vitamin B-12 730 200 - 1,100 pg/mL   Folate 18.2 ng/mL    Comment:                            Reference Range                            Low:           <3.4                            Borderline:    3.4-5.4                            Normal:        >5.4 .   Hemoglobin A1c     Status: None   Collection Time: 09/13/20  8:05 AM  Result Value Ref Range   Hgb A1c MFr Bld 5.1 <5.7 % of total Hgb    Comment: For the purpose of screening for the presence of diabetes: . <5.7%       Consistent with the absence of diabetes 5.7-6.4%    Consistent with increased risk for diabetes             (  prediabetes) > or =6.5%  Consistent with diabetes . This assay result is consistent with a decreased risk of diabetes. . Currently, no consensus exists regarding use of hemoglobin A1c for diagnosis of diabetes in children. . According to American Diabetes Association (ADA) guidelines, hemoglobin A1c <7.0% represents optimal control in non-pregnant diabetic patients. Different metrics may apply to specific patient populations.  Standards of Medical Care in Diabetes(ADA). .    Mean Plasma Glucose 100 mg/dL   eAG (mmol/L) 5.5 mmol/L  VITAMIN D 25 Hydroxy (Vit-D Deficiency, Fractures)     Status: None   Collection Time:  09/13/20  8:05 AM  Result Value Ref Range   Vit D, 25-Hydroxy 70 30 - 100 ng/mL    Comment: Vitamin D Status         25-OH Vitamin D: . Deficiency:                    <20 ng/mL Insufficiency:             20 - 29 ng/mL Optimal:                 > or = 30 ng/mL . For 25-OH Vitamin D testing on patients on  D2-supplementation and patients for whom quantitation  of D2 and D3 fractions is required, the [facility](TM) 25-OH VIT D, (D2,D3), LC/MS/MS is recommended: order  code [phone] (patients >78yr). See Note 1 . Note 1 . For additional information, please refer to  http://education.[facility].com/faq/FAQ199  (This link is being provided for informational/ educational purposes only.)   Thyroid Panel With TSH     Status: None   Collection Time: 09/13/20  8:05 AM  Result Value Ref Range   T3 Uptake 26 22 - 35 %   T4, Total 9.6 5.1 - 11.9 mcg/dL   Free Thyroxine Index 2.5 1.4 - 3.8   TSH 4.05 0.40 - 4.50 mIU/L      Fall Risk: Fall Risk  11/18/2020 09/15/2020 06/15/2020 03/11/2020 12/09/2019  Falls in the past year? 0 0 0 0 0  Number falls in past yr: 0 0 0 0 0  Injury with Fall? 0 0 0 0 0  Risk for fall due to : No Fall Risks No Fall Risks - - -  Follow up Falls prevention discussed Falls prevention discussed - Falls evaluation completed -     Functional Status Survey: Is the patient deaf or have difficulty hearing?: No Does the patient have difficulty seeing, even when wearing glasses/contacts?: No Does the patient have difficulty concentrating, remembering, or making decisions?: No Does the patient have difficulty walking or climbing stairs?: No Does the patient have difficulty dressing or bathing?: No Does the patient have difficulty doing errands alone such as visiting a doctor's office or shopping?: No   Assessment & Plan  1. Well adult exam  Discussed increase in physical activity, up to date with immunizations.  -USPSTF grade A and B recommendations  reviewed with patient; age-appropriate recommendations, preventive care, screening tests, etc discussed and encouraged; healthy living encouraged; see AVS for patient education given to patient -Discussed importance of 150 minutes of physical activity weekly, eat two servings of fish weekly, eat one serving of tree nuts ( cashews, pistachios, pecans, almonds..[place] [employer] every other day, eat 6 servings of fruit/vegetables daily and drink plenty of water and avoid sweet beverages.

## 2020-11-18 ENCOUNTER — Other Ambulatory Visit: Payer: Self-pay | Admitting: Emergency Medicine

## 2020-11-18 ENCOUNTER — Other Ambulatory Visit: Payer: Self-pay

## 2020-11-18 ENCOUNTER — Ambulatory Visit (INDEPENDENT_AMBULATORY_CARE_PROVIDER_SITE_OTHER): Payer: Federal, State, Local not specified - PPO | Admitting: Family Medicine

## 2020-11-18 ENCOUNTER — Encounter: Payer: Self-pay | Admitting: Family Medicine

## 2020-11-18 VITALS — BP 110/72 | HR 91 | Temp 98.1°F | Resp 16 | Ht 62.0 in | Wt 177.0 lb

## 2020-11-18 DIAGNOSIS — Z0000 Encounter for general adult medical examination without abnormal findings: Secondary | ICD-10-CM | POA: Diagnosis not present

## 2020-11-18 DIAGNOSIS — J302 Other seasonal allergic rhinitis: Secondary | ICD-10-CM

## 2020-11-18 MED ORDER — FLUTICASONE PROPIONATE 50 MCG/ACT NA SUSP: 2.0000 | Freq: Every day | NASAL | 1 refills | Status: DC

## 2020-11-26 ENCOUNTER — Encounter: Payer: Self-pay | Admitting: Family Medicine

## 2020-12-10 ENCOUNTER — Other Ambulatory Visit: Payer: Self-pay

## 2020-12-10 ENCOUNTER — Ambulatory Visit
Admission: RE | Admit: 2020-12-10 | Discharge: 2020-12-10 | Disposition: A | Discharge status: Home / Self Care | Payer: Federal, State, Local not specified - PPO | Source: Ambulatory Visit | Attending: Family Medicine | Admitting: Family Medicine

## 2020-12-10 DIAGNOSIS — Z1231 Encounter for screening mammogram for malignant neoplasm of breast: Secondary | ICD-10-CM | POA: Insufficient documentation

## 2020-12-14 ENCOUNTER — Encounter: Payer: Self-pay | Admitting: Family Medicine

## 2020-12-14 NOTE — Progress Notes (Signed)
Name: Terri Castillo   MRN: [id number]    DOB: 1950/12/02   Date:12/15/2020       Progress Note  Subjective  Chief Complaint  Follow up   I connected with  Terri Castillo  on 12/15/20 at  3:20 PM EST by a video enabled telemedicine application and verified that I am speaking with the correct person using two identifiers.  I discussed the limitations of evaluation and management by telemedicine and the availability of in person appointments. The patient expressed understanding and agreed to proceed with the virtual visit  Staff also discussed with the patient that there may be a patient responsible charge related to this service. Patient Location: at work  Provider Location: [facility] Additional Individuals present: alone   HPI  Depression Mild in Remission:  Terri Castillo has tried and failed a lot of medications including Prozac,  Duloxetine , Wellbutrin and Pristiq ( worked but too expensive) She has been on Lexapro for a while and is doing well. . She states since Vyvanse was started because of binge eating disorder 12/2016 and she states it has helped her energy level. She feels well, in remission.Continue current medications.    History of bariatric surgery: she had gastric bypass in 2010, her lowest weight was 180 lbs, but was gradually gained weight since, weight is stable in the 220's lbs range, she started Terri Castillo on June 28 th, 2021 at a weight of 221 lbs and is down to 175 lbs She states she is not losing as fast , explained that it happens when you reach a plateau  Eating snacks from [employer] 5 times a day and one meal a day - lean protein and green. Weight is stable but trending down .    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  She  started Terri Castillo in June 2021 , starting weight ( prior to diet change ) was 221 lbs, weight has been stable in the mid 170 lbs   Insulin Resistance: she has polyphagia again  but denies polydipsia or polyuria.  elevated fasting glucose but normal A1C. Continue life style modification and Vyvanse has helped curb her appetite   B12 deficiency: she is taking supplements a few times a week and level back to normal range.    Chronic back pain:  She still had daily intermittent pain. Taking Flexeril for muscle spasms every night and seems to help with symptoms, no radiculitis  Symptoms are stable.    Rhythm change: she noticed her iwatch picked up Afib back in September 2019 she saw Dr. Okey Dupre had a holder monitor but did not show afib . No recent episodes of palpitation , and denies decrease in exercise tolerance   Hypothyroidism: normal TSH, continue current dose of levothyroxine, she states stable hair loss, no change in bowel movement or denies dry skin . Unchanged   Senile purpura: stable on arms. Stable.    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  B12 deficiency 07/02/2014   Cervical pain 07/02/2014   Arthralgia of lower leg 07/02/2014   Insomnia 07/02/2014   Depression, major, recurrent, mild (HCC) 07/02/2014   Dyslipidemia 07/02/2014   Dermatitis, eczematoid 07/02/2014   Genital herpes in women 07/02/2014   Gastric reflux 07/02/2014   Bariatric surgery status 07/02/2014   Herpes simplex 07/02/2014   H/O: pneumonia 07/02/2014   Eczema intertrigo 07/02/2014   Adult BMI 30+ 07/02/2014   OP (osteoporosis) 07/02/2014   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    Perennial allergic rhinitis with seasonal variation 07/02/2014   Basal cell papilloma 07/02/2014   Vitamin D deficiency 07/02/2014    Past Surgical History:  Procedure Laterality Date   ABDOMINAL HYSTERECTOMY  1994   CHOLECYSTECTOMY  2000   EYE SURGERY  2000   Cataract Removal   EYE SURGERY  2002   Cataract Removal   GASTRIC BYPASS  2000   Mini   GASTRIC BYPASS  2010    Family History  Problem Relation Age of Onset   Dementia Mother    COPD Mother    Osteoporosis Mother    Diabetes Father    Glaucoma Father    Parkinson's disease Father    Glaucoma Brother    Heart Problems Brother    Heart attack Brother 35   Brain cancer Daughter        Brain Tumor   Hypothyroidism Daughter    Cancer Daughter    Heart disease Maternal Aunt    Heart disease Maternal Uncle    Breast cancer Neg Hx     Social History   Socioeconomic History   Marital status: Divorced    Spouse name: Not on file   Number of children: 2   Years of education: Not on file   Highest education level: Some college, no degree  Occupational History   Not on file  Tobacco Use   Smoking status: Former    Packs/day: 0.50    Years: 5.00    Pack years: 2.50    Types: Cigarettes    Start date: 01/23/1985    Quit date: 1992    Years since quitting: 30.9   Smokeless tobacco: Never  Vaping Use   Vaping Use: Never used  Substance and Sexual Activity   Alcohol use: Not Currently    Comment: 3 drinks/year   Drug use: No   Sexual activity: Not Currently    Partners: Male  Other Topics Concern   Not on file  Social History Narrative   Not on file   Social Determinants of Health   Castillo Resource Strain: Low Risk    Difficulty of Paying Living Expenses: Not hard at all  Food Insecurity: No Food Insecurity   Worried About Programme researcher, broadcasting/film/video in the Last Year: Never true   Barista in the Last Year: Never true  Transportation Needs: No Transportation Needs   Lack of Transportation (Medical):  No   Lack of Transportation (Non-Medical): No  Physical Activity: Inactive   Days of Exercise per Week: 0 days   Minutes of Exercise per Session: 0 min  Stress: No Stress Concern Present   Feeling of Stress : Not at all  Social Connections: Socially Isolated   Frequency of Communication with Friends and Family: More than three times a week   Frequency of Social Gatherings with Friends and Family: Once a week   Attends Religious Services: Never   Database administrator or Organizations: No   Attends Club or  Organization Meetings: Never   Marital Status: Divorced  Catering manager Violence: Not At Risk   Fear of Current or Ex-Partner: No   Emotionally Abused: No   Physically Abused: No   Sexually Abused: No     Current Outpatient Medications:    Calcium Carb-Cholecalciferol (CALCIUM/VITAMIN D PO), Take by mouth daily., Disp: , Rfl:    cetirizine (ZYRTEC) 10 MG tablet, Take 10 mg by mouth daily as needed. , Disp: , Rfl:    Cyanocobalamin (B-12) 1000 MCG SUBL, Place 1 tablet under the tongue daily. Every other day, Disp: 45 tablet, Rfl: 1   cyclobenzaprine (FLEXERIL) 10 MG tablet, Take 1 tablet (10 mg total) by mouth at bedtime., Disp: 90 tablet, Rfl: 1   escitalopram (LEXAPRO) 20 MG tablet, Take 1 tablet (20 mg total) by mouth daily., Disp: 90 tablet, Rfl: 1   ferrous sulfate 325 (65 FE) MG tablet, Take 1 tablet (325 mg total) by mouth daily with breakfast., Disp: 30 tablet, Rfl: 12   fluticasone (FLONASE) 50 MCG/ACT nasal spray, Place 2 sprays into both nostrils daily., Disp: 48 g, Rfl: 1   ketoconazole (NIZORAL) 2 % cream, APPLY TO RASH TWICE DAILY, Disp: 90 g, Rfl: 0   levothyroxine (SYNTHROID) 25 MCG tablet, Take 1 tablet (25 mcg total) by mouth daily before breakfast., Disp: 90 tablet, Rfl: 1   lisdexamfetamine (VYVANSE) 50 MG capsule, Take 1 capsule (50 mg total) by mouth daily., Disp: 30 capsule, Rfl: 0   lisdexamfetamine (VYVANSE) 50 MG capsule, Take 1 capsule (50 mg total) by  mouth daily., Disp: 30 capsule, Rfl: 0   meloxicam (MOBIC) 15 MG tablet, Take 15 mg by mouth daily., Disp: , Rfl:    PREMARIN vaginal cream, Place 1 Applicatorful vaginally 3 (three) times a week., Disp: 42.5 g, Rfl: 12   raloxifene (EVISTA) 60 MG tablet, Take 1 tablet (60 mg total) by mouth daily., Disp: 90 tablet, Rfl: 4   triamcinolone cream (KENALOG) 0.1 %, Apply 1 application topically 2 (two) times daily. Mix with lotion at home, Disp: 453.6 g, Rfl: 0   valACYclovir (VALTREX) 500 MG tablet, Take 1 tablet (500 mg total) by mouth 3 (three) times daily., Disp: 30 tablet, Rfl: 0   Vitamin D, Ergocalciferol, (DRISDOL) 1.25 MG (50000 UNIT) CAPS capsule, Take 1 capsule (50,000 Units total) by mouth every 14 (fourteen) days., Disp: 6 capsule, Rfl: 1  Allergies  Allergen Reactions   Topamax  [Topiramate]     severe depression   Trazodone     Other reaction(s): Dizziness    I personally reviewed active problem list, medication list, allergies, family history, social history with the patient/caregiver today.   ROS  Ten systems reviewed and is negative except as mentioned in HPI   Objective  Virtual encounter, vitals not obtained.  Body mass index is 32.37 kg/m.  Physical Exam  Awake, alert and oriented   PHQ2/9: Depression screen [facility] 2/9 12/15/2020 11/18/2020 09/15/2020 06/15/2020 03/11/2020  Decreased Interest 0 0 0 0 0  Down, Depressed, Hopeless 0 0 0 0 0  PHQ - 2 Score 0 0 0 0 0  Altered sleeping 0 0 0 0 1  Tired, decreased energy 0 0 0 0 0  Change in appetite 0 0 0 0 1  Feeling bad or failure about yourself  0 0 0 0 0  Trouble concentrating 0 0 0 0 0  Moving slowly or fidgety/restless 0 0 0 0 0  Suicidal thoughts 0 0 0 0 0  PHQ-9  Score 0 0 0 0 2  Difficult doing work/chores - - - - Not difficult at all  Some recent data might be hidden   PHQ-2/9 Result is negative.    Fall Risk: Fall Risk  12/15/2020 11/18/2020 09/15/2020 06/15/2020 03/11/2020  Falls in the past year? 0  0 0 0 0  Number falls in past yr: - 0 0 0 0  Injury with Fall? - 0 0 0 0  Risk for fall due to : - No Fall Risks No Fall Risks - -  Follow up Falls prevention discussed Falls prevention discussed Falls prevention discussed - Falls evaluation completed     Assessment & Plan  1. Dyslipidemia   2. PSVT (paroxysmal supraventricular tachycardia) (HCC)   3. B12 deficiency   4. Senile purpura (HCC)   5. Morbid obesity (HCC)  Discussed with the patient the risk posed by an increased BMI. Discussed importance of portion control, calorie counting and at least 150 minutes of physical activity weekly. Avoid sweet beverages and drink more water. Eat at least 6 servings of fruit and vegetables daily    6. Hyperglycemia   7. Vitamin D deficiency   8. Acquired hypothyroidism  - levothyroxine (SYNTHROID) 25 MCG tablet; Take 1 tablet (25 mcg total) by mouth daily before breakfast.  Dispense: 90 tablet; Refill: 1  9. Binge eating disorder  - lisdexamfetamine (VYVANSE) 50 MG capsule; Take 1 capsule (50 mg total) by mouth daily.  Dispense: 30 capsule; Refill: 0 - lisdexamfetamine (VYVANSE) 50 MG capsule; Take 1 capsule (50 mg total) by mouth daily.  Dispense: 30 capsule; Refill: 0 - lisdexamfetamine (VYVANSE) 50 MG capsule; Take 1 capsule (50 mg total) by mouth daily.  Dispense: 30 capsule; Refill: 0  10. Spasm of muscle, back  - cyclobenzaprine (FLEXERIL) 10 MG tablet; Take 1 tablet (10 mg total) by mouth at bedtime.  Dispense: 90 tablet; Refill: 1  11. Major depression in remission (HCC)  - escitalopram (LEXAPRO) 20 MG tablet; Take 1 tablet (20 mg total) by mouth daily.  Dispense: 90 tablet; Refill: 1 - lisdexamfetamine (VYVANSE) 50 MG capsule; Take 1 capsule (50 mg total) by mouth daily.  Dispense: 30 capsule; Refill: 0 - lisdexamfetamine (VYVANSE) 50 MG capsule; Take 1 capsule (50 mg total) by mouth daily.  Dispense: 30 capsule; Refill: 0 - lisdexamfetamine (VYVANSE) 50 MG capsule;  Take 1 capsule (50 mg total) by mouth daily.  Dispense: 30 capsule; Refill: 0   I discussed the assessment and treatment plan with the patient. The patient was provided an opportunity to ask questions and all were answered. The patient agreed with the plan and demonstrated an understanding of the instructions.  The patient was advised to call back or seek an in-person evaluation if the symptoms worsen or if the condition fails to improve as anticipated.  I provided 25  minutes of non-face-to-face time during this encounter.

## 2020-12-15 ENCOUNTER — Telehealth (INDEPENDENT_AMBULATORY_CARE_PROVIDER_SITE_OTHER): Payer: Federal, State, Local not specified - PPO | Admitting: Family Medicine

## 2020-12-15 ENCOUNTER — Encounter: Payer: Self-pay | Admitting: Family Medicine

## 2020-12-15 VITALS — Ht 62.0 in | Wt 177.0 lb

## 2020-12-15 DIAGNOSIS — I471 Supraventricular tachycardia, unspecified: Secondary | ICD-10-CM

## 2020-12-15 DIAGNOSIS — E785 Hyperlipidemia, unspecified: Secondary | ICD-10-CM | POA: Diagnosis not present

## 2020-12-15 DIAGNOSIS — F5081 Binge eating disorder: Secondary | ICD-10-CM

## 2020-12-15 DIAGNOSIS — E039 Hypothyroidism, unspecified: Secondary | ICD-10-CM

## 2020-12-15 DIAGNOSIS — M62830 Muscle spasm of back: Secondary | ICD-10-CM

## 2020-12-15 DIAGNOSIS — D692 Other nonthrombocytopenic purpura: Secondary | ICD-10-CM

## 2020-12-15 DIAGNOSIS — E538 Deficiency of other specified B group vitamins: Secondary | ICD-10-CM

## 2020-12-15 DIAGNOSIS — F325 Major depressive disorder, single episode, in full remission: Secondary | ICD-10-CM

## 2020-12-15 DIAGNOSIS — E559 Vitamin D deficiency, unspecified: Secondary | ICD-10-CM

## 2020-12-15 DIAGNOSIS — F50819 Binge eating disorder, unspecified: Secondary | ICD-10-CM

## 2020-12-15 DIAGNOSIS — R739 Hyperglycemia, unspecified: Secondary | ICD-10-CM

## 2020-12-15 MED ORDER — LEVOTHYROXINE SODIUM 25 MCG PO TABS: 25.0000 ug | ORAL_TABLET | Freq: Every day | ORAL | 1 refills | Status: DC

## 2020-12-15 MED ORDER — LISDEXAMFETAMINE DIMESYLATE 50 MG PO CAPS: 50.0000 mg | ORAL_CAPSULE | Freq: Every day | ORAL | 0 refills | Status: DC

## 2020-12-15 MED ORDER — CYCLOBENZAPRINE HCL 10 MG PO TABS: 10.0000 mg | ORAL_TABLET | Freq: Every day | ORAL | 1 refills | Status: DC

## 2020-12-15 MED ORDER — ESCITALOPRAM OXALATE 20 MG PO TABS: 20.0000 mg | ORAL_TABLET | Freq: Every day | ORAL | 1 refills | Status: DC

## 2020-12-19 ENCOUNTER — Other Ambulatory Visit: Payer: Self-pay | Admitting: Family Medicine

## 2020-12-19 DIAGNOSIS — M810 Age-related osteoporosis without current pathological fracture: Secondary | ICD-10-CM

## 2021-01-20 ENCOUNTER — Telehealth: Payer: Federal, State, Local not specified - PPO | Admitting: Physician Assistant

## 2021-01-20 ENCOUNTER — Encounter: Payer: Self-pay | Admitting: Family Medicine

## 2021-01-20 DIAGNOSIS — R3989 Other symptoms and signs involving the genitourinary system: Secondary | ICD-10-CM

## 2021-01-20 MED ORDER — CEPHALEXIN 500 MG PO CAPS: 500.0000 mg | ORAL_CAPSULE | Freq: Two times a day (BID) | ORAL | 0 refills | Status: AC

## 2021-01-20 NOTE — Progress Notes (Deleted)
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   B12 deficiency 07/02/2014   Cervical pain 07/02/2014   Arthralgia of lower leg 07/02/2014   Insomnia 07/02/2014   Depression, major, recurrent, mild (HCC) 07/02/2014   Dyslipidemia 07/02/2014   Dermatitis, eczematoid 07/02/2014   Genital herpes in women 07/02/2014   Gastric reflux 07/02/2014   Bariatric surgery status 07/02/2014   Herpes simplex 07/02/2014   H/O: pneumonia 07/02/2014   Eczema intertrigo 07/02/2014   Adult BMI 30+ 07/02/2014   OP (osteoporosis) 07/02/2014   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   Perennial allergic rhinitis with seasonal variation 07/02/2014   Basal cell papilloma 07/02/2014   Vitamin D deficiency 07/02/2014    Past Surgical History:  Procedure Laterality Date   ABDOMINAL HYSTERECTOMY  1994   CHOLECYSTECTOMY  2000   EYE SURGERY  2000   Cataract Removal   EYE SURGERY  2002   Cataract Removal   GASTRIC BYPASS  2000   Mini   GASTRIC BYPASS  2010    Family History  Problem Relation Age of Onset   Dementia Mother    COPD Mother    Osteoporosis Mother    Diabetes Father    Glaucoma Father    Parkinson's disease Father    Glaucoma Brother    Heart Problems Brother    Heart attack Brother 79   Brain cancer Daughter        Brain Tumor    Hypothyroidism Daughter    Cancer Daughter    Heart disease Maternal Aunt    Heart disease Maternal Uncle    Breast cancer Neg Hx     Social History   Tobacco Use   Smoking status: Former    Packs/day: 0.50    Years: 5.00    Pack years: 2.50    Types: Cigarettes    Start date: 01/23/1985    Quit date: 1992    Years since quitting: 31.0   Smokeless tobacco: Never  Substance Use Topics   Alcohol use: Not Currently    Comment: 3 drinks/year     Current Outpatient Medications:    Calcium Carb-Cholecalciferol (CALCIUM/VITAMIN D PO), Take by mouth daily., Disp: , Rfl:    cetirizine (ZYRTEC) 10 MG tablet, Take 10 mg by mouth daily as needed. , Disp: , Rfl:    Cyanocobalamin (B-12) 1000 MCG SUBL, Place 1 tablet under the tongue daily. Every other day, Disp: 45 tablet, Rfl: 1   cyclobenzaprine (FLEXERIL) 10 MG tablet, Take 1 tablet (10 mg total) by mouth at bedtime., Disp: 90 tablet, Rfl: 1   escitalopram (LEXAPRO) 20 MG tablet, Take 1 tablet (20 mg total) by mouth daily., Disp: 90 tablet, Rfl: 1   ferrous sulfate 325 (65 FE) MG tablet, Take 1 tablet (325 mg total) by mouth daily with breakfast., Disp: 30 tablet, Rfl: 12   fluticasone (FLONASE) 50  MCG/ACT nasal spray, Place 2 sprays into both nostrils daily., Disp: 48 g, Rfl: 1   ketoconazole (NIZORAL) 2 % cream, APPLY TO RASH TWICE DAILY, Disp: 90 g, Rfl: 0   levothyroxine (SYNTHROID) 25 MCG tablet, Take 1 tablet (25 mcg total) by mouth daily before breakfast., Disp: 90 tablet, Rfl: 1   lisdexamfetamine (VYVANSE) 50 MG capsule, Take 1 capsule (50 mg total) by mouth daily., Disp: 30 capsule, Rfl: 0   lisdexamfetamine (VYVANSE) 50 MG capsule, Take 1 capsule (50 mg total) by mouth daily., Disp: 30 capsule, Rfl: 0   lisdexamfetamine (VYVANSE) 50 MG capsule, Take 1 capsule (50 mg total) by mouth daily., Disp: 30 capsule, Rfl: 0   meloxicam (MOBIC) 15 MG tablet, Take 15 mg by mouth daily., Disp: , Rfl:    PREMARIN vaginal cream, Place 1  Applicatorful vaginally 3 (three) times a week., Disp: 42.5 g, Rfl: 12   raloxifene (EVISTA) 60 MG tablet, TAKE 1 TABLET BY MOUTH EVERY DAY, Disp: 90 tablet, Rfl: 4   triamcinolone cream (KENALOG) 0.1 %, Apply 1 application topically 2 (two) times daily. Mix with lotion at home, Disp: 453.6 g, Rfl: 0   valACYclovir (VALTREX) 500 MG tablet, Take 1 tablet (500 mg total) by mouth 3 (three) times daily., Disp: 30 tablet, Rfl: 0   Vitamin D, Ergocalciferol, (DRISDOL) 1.25 MG (50000 UNIT) CAPS capsule, Take 1 capsule (50,000 Units total) by mouth every 14 (fourteen) days., Disp: 6 capsule, Rfl: 1  Allergies  Allergen Reactions   Topamax  [Topiramate]     severe depression   Trazodone     Other reaction(s): Dizziness    I personally reviewed active problem list, medication list, allergies, family history, social history, health maintenance with the patient/caregiver today.   ROS  ***  Objective  There were no vitals filed for this visit.  There is no height or weight on file to calculate BMI.  Physical Exam ***  No results found for this or any previous visit (from the past 2160 hour(s)).   PHQ2/9: Depression screen [facility] 2/9 12/15/2020 11/18/2020 09/15/2020 06/15/2020 03/11/2020  Decreased Interest 0 0 0 0 0  Down, Depressed, Hopeless 0 0 0 0 0  PHQ - 2 Score 0 0 0 0 0  Altered sleeping 0 0 0 0 1  Tired, decreased energy 0 0 0 0 0  Change in appetite 0 0 0 0 1  Feeling bad or failure about yourself  0 0 0 0 0  Trouble concentrating 0 0 0 0 0  Moving slowly or fidgety/restless 0 0 0 0 0  Suicidal thoughts 0 0 0 0 0  PHQ-9 Score 0 0 0 0 2  Difficult doing work/chores - - - - Not difficult at all  Some recent data might be hidden    phq 9 is {gen pos [id number]}   Fall Risk: Fall Risk  12/15/2020 11/18/2020 09/15/2020 06/15/2020 03/11/2020  Falls in the past year? 0 0 0 0 0  Number falls in past yr: - 0 0 0 0  Injury with Fall? - 0 0 0 0  Risk for fall due to : - No Fall Risks  No Fall Risks - -  Follow up Falls prevention discussed Falls prevention discussed Falls prevention discussed - Falls evaluation completed      Functional Status Survey:      Assessment & Plan  *** There are no diagnoses linked to this encounter.

## 2021-01-20 NOTE — Progress Notes (Signed)
I have spent 5 minutes in review of e-visit questionnaire, review and updating patient chart, medical decision making and response to patient.   Otha Rickles Cody Cashawn Yanko, PA-C    

## 2021-01-20 NOTE — Progress Notes (Signed)

## 2021-01-21 ENCOUNTER — Ambulatory Visit: Payer: Federal, State, Local not specified - PPO | Admitting: Family Medicine

## 2021-01-28 ENCOUNTER — Telehealth: Payer: Federal, State, Local not specified - PPO | Admitting: Physician Assistant

## 2021-01-28 DIAGNOSIS — N390 Urinary tract infection, site not specified: Secondary | ICD-10-CM | POA: Diagnosis not present

## 2021-01-28 MED ORDER — SULFAMETHOXAZOLE-TRIMETHOPRIM 800-160 MG PO TABS: 1.0000 | ORAL_TABLET | Freq: Two times a day (BID) | ORAL | 0 refills | Status: DC

## 2021-01-28 NOTE — Progress Notes (Signed)
E-Visit for Urinary Problems  We are sorry that you are not feeling well.  Here is how we plan to help!  Based on what you shared with me it looks like you most likely have a simple urinary tract infection.  I do want to let you know that we normally do not re-treat UTIs when you have failed treatment. However, I am taking into consideration it is 6:30 pm on a Friday and you would have to go to the ER at this hour, which I feel is unnecessary. If you fail this treatment, you MUST be seen in person for a urine culture.   A UTI (Urinary Tract Infection) is a bacterial infection of the bladder.  Most cases of urinary tract infections are simple to treat but a key part of your care is to encourage you to drink plenty of fluids and watch your symptoms carefully.  I have prescribed Bactrim DS One tablet twice a day for 5 days.  Your symptoms should gradually improve. Call us if the burning in your urine worsens, you develop worsening fever, back pain or pelvic pain or if your symptoms do not resolve after completing the antibiotic.  Urinary tract infections can be prevented by drinking plenty of water to keep your body hydrated.  Also be sure when you wipe, wipe from front to back and don't hold it in!  If possible, empty your bladder every 4 hours.  HOME CARE Drink plenty of fluids Compete the full course of the antibiotics even if the symptoms resolve Remember, when you need to gogo. Holding in your urine can increase the likelihood of getting a UTI! GET HELP RIGHT AWAY IF: You cannot urinate You get a high fever Worsening back pain occurs You see blood in your urine You feel sick to your stomach or throw up You feel like you are going to pass out  MAKE SURE YOU  Understand these instructions. Will watch your condition. Will get help right away if you are not doing well or get worse.   Thank you for choosing an e-visit.  Your e-visit answers were reviewed by a board certified  advanced clinical practitioner to complete your personal care plan. Depending upon the condition, your plan could have included both over the counter or prescription medications.  Please review your pharmacy choice. Make sure the pharmacy is open so you can pick up prescription now. If there is a problem, you may contact your provider through [employer] and have the prescription routed to another pharmacy.  Your safety is important to [place]. If you have drug allergies check your prescription carefully.   For the next 24 hours you can use MyChart to ask questions about today's visit, request a non-urgent call back, or ask for a work or school excuse. You will get an email in the next two days asking about your experience. I hope that your e-visit has been valuable and will speed your recovery.  I provided 5 minutes of non face-to-face time during this encounter for chart review and documentation.

## 2021-02-14 ENCOUNTER — Encounter: Payer: Self-pay | Admitting: Family Medicine

## 2021-02-14 ENCOUNTER — Other Ambulatory Visit: Payer: Self-pay | Admitting: Family Medicine

## 2021-02-14 DIAGNOSIS — F5081 Binge eating disorder: Secondary | ICD-10-CM

## 2021-02-14 DIAGNOSIS — F325 Major depressive disorder, single episode, in full remission: Secondary | ICD-10-CM

## 2021-02-24 ENCOUNTER — Encounter: Payer: Self-pay | Admitting: Internal Medicine

## 2021-02-24 ENCOUNTER — Ambulatory Visit (INDEPENDENT_AMBULATORY_CARE_PROVIDER_SITE_OTHER): Payer: Federal, State, Local not specified - PPO | Admitting: Internal Medicine

## 2021-02-24 VITALS — BP 134/72 | HR 66 | Temp 97.5°F | Resp 16 | Ht 62.0 in | Wt 188.8 lb

## 2021-02-24 DIAGNOSIS — Z23 Encounter for immunization: Secondary | ICD-10-CM | POA: Diagnosis not present

## 2021-02-24 DIAGNOSIS — E039 Hypothyroidism, unspecified: Secondary | ICD-10-CM

## 2021-02-24 DIAGNOSIS — F5081 Binge eating disorder: Secondary | ICD-10-CM

## 2021-02-24 DIAGNOSIS — F3341 Major depressive disorder, recurrent, in partial remission: Secondary | ICD-10-CM

## 2021-02-24 MED ORDER — LISDEXAMFETAMINE DIMESYLATE 50 MG PO CAPS: 50.0000 mg | ORAL_CAPSULE | Freq: Every day | ORAL | 0 refills | Status: DC

## 2021-02-24 MED ORDER — LISDEXAMFETAMINE DIMESYLATE 50 MG PO CAPS
50.0000 mg | ORAL_CAPSULE | Freq: Every day | ORAL | 0 refills | Status: DC
Start: 2021-05-16 — End: 2021-08-18

## 2021-02-24 NOTE — Patient Instructions (Addendum)
It was great seeing you today!  Plan discussed at today's visit: -Refills sent to pharmacy  -Prevnar 20 given today for pneumonia   Follow up in: 3 months   Take care and let us know if you have any questions or concerns prior to your next visit.  Dr. Rosana Berger

## 2021-02-24 NOTE — Progress Notes (Signed)
Established Patient Office Visit  Subjective:  Patient ID: Terri Castillo, female    DOB: 1950/10/24  Age: 71 y.o. MRN: [id number]  CC:  Chief Complaint  Patient presents with   Follow-up   Depression    HPI Terri Castillo presents for follow up on chronic medical conditions.   MDD: -Mood status: stable -Current treatment: Lexapro 20, Vyvanse for binge eating disorder -Satisfied with current treatment?: yes -Symptom severity: moderate  -Duration of current treatment : chronic -Side effects: no Medication compliance: excellent compliance  Binge Eating Disorder/Hx of Gastric Bypass: -Gastric bypass in 2010 -Currently on Vyvanse since 2018, been successful with maintaining weight loss on medication overall, however did gain some weight recently over the holidays  Hypothyroidism: -Medications: Levothyroxine 25  -Patient is compliant with the above medication (s) at the above dose and reports no medication side effects.  -Denies weight changes, cold./heat intolerance, skin changes, anxiety/palpitations  -Last TSH: 4.05 8/22   Past Medical History:  Diagnosis Date   Chronic back pain    Depression    Insomnia    Obesity     Past Surgical History:  Procedure Laterality Date   ABDOMINAL HYSTERECTOMY  1994   CHOLECYSTECTOMY  2000   EYE SURGERY  2000   Cataract Removal   EYE SURGERY  2002   Cataract Removal   GASTRIC BYPASS  2000   Mini   GASTRIC BYPASS  2010    Family History  Problem Relation Age of Onset   Dementia Mother    COPD Mother    Osteoporosis Mother    Diabetes Father    Glaucoma Father    Parkinson's disease Father    Glaucoma Brother    Heart Problems Brother    Heart attack Brother 25   Brain cancer Daughter        Brain Tumor   Hypothyroidism Daughter    Cancer Daughter    Heart disease Maternal Aunt    Heart disease Maternal Uncle    Breast cancer Neg Hx     Social History   Socioeconomic History   Marital status:  Divorced    Spouse name: Not on file   Number of children: 2   Years of education: Not on file   Highest education level: Some college, no degree  Occupational History   Not on file  Tobacco Use   Smoking status: Former    Packs/day: 0.50    Years: 5.00    Pack years: 2.50    Types: Cigarettes    Start date: 01/23/1985    Quit date: 1992    Years since quitting: 31.1   Smokeless tobacco: Never  Vaping Use   Vaping Use: Never used  Substance and Sexual Activity   Alcohol use: Not Currently    Comment: 3 drinks/year   Drug use: No   Sexual activity: Not Currently    Partners: Male  Other Topics Concern   Not on file  Social History Narrative   Not on file   Social Determinants of Health   Financial Resource Strain: Low Risk    Difficulty of Paying Living Expenses: Not hard at all  Food Insecurity: No Food Insecurity   Worried About Charity fundraiser in the Last Year: Never true   Ran Out of Food in the Last Year: Never true  Transportation Needs: No Transportation Needs   Lack of Transportation (Medical): No   Lack of Transportation (Non-Medical): No  Physical Activity: Inactive  Days of Exercise per Week: 0 days   Minutes of Exercise per Session: 0 min  Stress: No Stress Concern Present   Feeling of Stress : Not at all  Social Connections: Socially Isolated   Frequency of Communication with Friends and Family: More than three times a week   Frequency of Social Gatherings with Friends and Family: Once a week   Attends Religious Services: Never   Marine scientist or Organizations: No   Attends Music therapist: Never   Marital Status: Divorced  Human resources officer Violence: Not At Risk   Fear of Current or Ex-Partner: No   Emotionally Abused: No   Physically Abused: No   Sexually Abused: No    Outpatient Medications Prior to Visit  Medication Sig Dispense Refill   Calcium Carb-Cholecalciferol (CALCIUM/VITAMIN D PO) Take by mouth daily.      cetirizine (ZYRTEC) 10 MG tablet Take 10 mg by mouth daily as needed.      Cyanocobalamin (B-12) 1000 MCG SUBL Place 1 tablet under the tongue daily. Every other day 45 tablet 1   cyclobenzaprine (FLEXERIL) 10 MG tablet Take 1 tablet (10 mg total) by mouth at bedtime. 90 tablet 1   escitalopram (LEXAPRO) 20 MG tablet Take 1 tablet (20 mg total) by mouth daily. 90 tablet 1   ferrous sulfate 325 (65 FE) MG tablet Take 1 tablet (325 mg total) by mouth daily with breakfast. 30 tablet 12   fluticasone (FLONASE) 50 MCG/ACT nasal spray Place 2 sprays into both nostrils daily. 48 g 1   ketoconazole (NIZORAL) 2 % cream APPLY TO RASH TWICE DAILY 90 g 0   levothyroxine (SYNTHROID) 25 MCG tablet Take 1 tablet (25 mcg total) by mouth daily before breakfast. 90 tablet 1   lisdexamfetamine (VYVANSE) 50 MG capsule Take 1 capsule (50 mg total) by mouth daily. 30 capsule 0   lisdexamfetamine (VYVANSE) 50 MG capsule Take 1 capsule (50 mg total) by mouth daily. 30 capsule 0   lisdexamfetamine (VYVANSE) 50 MG capsule Take 1 capsule (50 mg total) by mouth daily. 30 capsule 0   meloxicam (MOBIC) 15 MG tablet Take 15 mg by mouth daily.     PREMARIN vaginal cream Place 1 Applicatorful vaginally 3 (three) times a week. 42.5 g 12   raloxifene (EVISTA) 60 MG tablet TAKE 1 TABLET BY MOUTH EVERY DAY 90 tablet 4   sulfamethoxazole-trimethoprim (BACTRIM DS) 800-160 MG tablet Take 1 tablet by mouth 2 (two) times daily. 10 tablet 0   triamcinolone cream (KENALOG) 0.1 % Apply 1 application topically 2 (two) times daily. Mix with lotion at home 453.6 g 0   valACYclovir (VALTREX) 500 MG tablet Take 1 tablet (500 mg total) by mouth 3 (three) times daily. 30 tablet 0   Vitamin D, Ergocalciferol, (DRISDOL) 1.25 MG (50000 UNIT) CAPS capsule Take 1 capsule (50,000 Units total) by mouth every 14 (fourteen) days. 6 capsule 1   No facility-administered medications prior to visit.    Allergies  Allergen Reactions   Topamax  [Topiramate]      severe depression   Trazodone     Other reaction(s): Dizziness    ROS Review of Systems  Constitutional:  Negative for chills and fever.  Respiratory:  Negative for cough and shortness of breath.   Cardiovascular:  Negative for chest pain and palpitations.  Gastrointestinal:  Negative for abdominal pain.     Objective:    Physical Exam Constitutional:      Appearance: Normal appearance.  HENT:     Head: Normocephalic and atraumatic.  Eyes:     Conjunctiva/sclera: Conjunctivae normal.  Cardiovascular:     Rate and Rhythm: Normal rate and regular rhythm.  Pulmonary:     Effort: Pulmonary effort is normal.     Breath sounds: Normal breath sounds.  Musculoskeletal:     Right lower leg: No edema.     Left lower leg: No edema.  Skin:    General: Skin is warm and dry.  Neurological:     General: No focal deficit present.     Mental Status: She is alert. Mental status is at baseline.  Psychiatric:        Mood and Affect: Mood normal.        Behavior: Behavior normal.    BP 134/72    Pulse 66    Temp (!) 97.5 F (36.4 C) (Oral)    Resp 16    Ht _0  (1.575 m)    Wt 188 lb 12.8 oz (85.6 kg)    SpO2 98%    BMI 34.53 kg/m  Wt Readings from Last 3 Encounters:  02/24/21 188 lb 12.8 oz (85.6 kg)  12/15/20 177 lb (80.3 kg)  11/18/20 177 lb (80.3 kg)     There are no preventive care reminders to display for this patient.  There are no preventive care reminders to display for this patient.  Lab Results  Component Value Date   TSH 4.05 09/13/2020   Lab Results  Component Value Date   WBC 5.9 09/13/2020   HGB 14.8 09/13/2020   HCT 45.9 (H) 09/13/2020   MCV 94.8 09/13/2020   PLT 338 09/13/2020   Lab Results  Component Value Date   NA 141 09/13/2020   K 5.1 09/13/2020   CO2 32 09/13/2020   GLUCOSE 92 09/13/2020   BUN 27 (H) 09/13/2020   CREATININE 0.72 09/13/2020   BILITOT 0.8 09/13/2020   ALKPHOS 107 06/28/2016   AST 20 09/13/2020   ALT 26 09/13/2020    PROT 6.8 09/13/2020   ALBUMIN 3.6 06/28/2016   CALCIUM 9.4 09/13/2020   EGFR 91 09/13/2020   Lab Results  Component Value Date   CHOL 142 09/13/2020   Lab Results  Component Value Date   HDL 44 (L) 09/13/2020   Lab Results  Component Value Date   LDLCALC 78 09/13/2020   Lab Results  Component Value Date   TRIG 115 09/13/2020   Lab Results  Component Value Date   CHOLHDL 3.2 09/13/2020   Lab Results  Component Value Date   HGBA1C 5.1 09/13/2020      Assessment & Plan:   1. Binge eating disorder: Vyvanse refilled today.  - lisdexamfetamine (VYVANSE) 50 MG capsule; Take 1 capsule (50 mg total) by mouth daily.  Dispense: 30 capsule; Refill: 0 - lisdexamfetamine (VYVANSE) 50 MG capsule; Take 1 capsule (50 mg total) by mouth daily.  Dispense: 30 capsule; Refill: 0 - lisdexamfetamine (VYVANSE) 50 MG capsule; Take 1 capsule (50 mg total) by mouth daily.  Dispense: 30 capsule; Refill: 0  2. Acquired hypothyroidism: Stable, doing well on current Levothyroxine dose.   3. Recurrent major depressive disorder, in partial remission ([place]): Stable on Lexapro, doing well.   4. Vaccine for streptococcus pneumoniae and influenza: Prevnar 20 administered today.  - Pneumococcal conjugate vaccine 20-valent (Prevnar 20)   Follow-up: Return in about 3 months (around 05/24/2021).    Teodora Medici, DO

## 2021-03-30 ENCOUNTER — Encounter: Payer: Self-pay | Admitting: Family Medicine

## 2021-03-31 ENCOUNTER — Other Ambulatory Visit: Payer: Self-pay | Admitting: Family Medicine

## 2021-03-31 DIAGNOSIS — K219 Gastro-esophageal reflux disease without esophagitis: Secondary | ICD-10-CM

## 2021-03-31 MED ORDER — OMEPRAZOLE 40 MG PO CPDR
40.0000 mg | DELAYED_RELEASE_CAPSULE | Freq: Every day | ORAL | 0 refills | Status: DC
Start: 2021-03-31 — End: 2021-04-25

## 2021-04-23 ENCOUNTER — Other Ambulatory Visit: Payer: Self-pay | Admitting: Family Medicine

## 2021-04-23 DIAGNOSIS — K219 Gastro-esophageal reflux disease without esophagitis: Secondary | ICD-10-CM

## 2021-05-23 NOTE — Progress Notes (Signed)
Name: Terri Castillo   MRN: [id number]    DOB: 06-28-1950   Date:05/24/2021 ? ?     Progress Note ? ?Subjective ? ?Chief Complaint ? ?Follow Up ? ?HPI ? ?Depression Mild in Remission:  Terri Castillo has tried and failed a lot of medications including Prozac,  Duloxetine , Wellbutrin and Pristiq ( worked but too expensive) She has been on Lexapro for a while and is doing well. . She states since Vyvanse was started because of binge eating disorder 12/2016 and she states it has helped her energy level. She is under more stress, daughter recently diagnosed with breast cancer - she has been going to the appointments with her ?  ?History of bariatric surgery: she had gastric bypass in 2010, her lowest weight was 180 lbs, but was gradually gained weight since, weight is stable in the 220's lbs range, she started [employer] on June 28 th, 2021 at a weight of 221 lbs and is down to 175 lbs after that she reached a plateau and stopped doing the diet and has been stressed and eating a lot again, weight is up to 197.1 lbs   ?  ?Bing eating disorder:  Started on Vyvanse 10/2016  Weight was 226 lbs ,she has been compliant with medication.  She  started [employer] in June 2021 , starting weight ( prior to diet change ) was 221 lbs, weight  was stable around 170 lbs but gaining weight again. She states out of Vyvanse for the past week  ?  ?Insulin Resistance: she has polyphagia again but denies polydipsia or polyuria.  elevated fasting glucose but normal A1C.  ? ?B12 deficiency: she stopped taking supplementation, advised to resume a few times a week.  ? ?Vitamin D Deficiency: now taking it twice a month only  ?  ?Chronic back pain:  She still had daily intermittent pain. Taking Flexeril for muscle spasms every night and seems to help with symptoms, no radiculitis  Unchanged  ? ?Heart rhythm change/PSVT : she noticed her iwatch picked up Afib back in September 2019 she saw Dr. Okey Dupre had a holder monitor but did not show afib . No  recent episodes of palpitation , and denies decrease in exercise tolerance Unchanged  ? ?Hypothyroidism: last TSH was at goal again, continue current dose of levothyroxine, she states stable hair loss, no change in bowel movement or denies dry skin .  ? ?Senile purpura: stable on arms. Reassurance given  ? ?Patient Active Problem List  ? Diagnosis Date Noted  ? PSVT (paroxysmal supraventricular tachycardia) (HCC) 05/03/2018  ? PVC (premature ventricular contraction) 05/03/2018  ? PAC (premature atrial contraction) 05/03/2018  ? Palpitations 02/04/2018  ? 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 Senile purpura (HCC) 08/11/2016  ? Facet arthropathy, lumbar 02/02/2016  ? Spondylisthesis 01/12/2016  ? Chronic bilateral low back pain 11/01/2015  ? Iron deficiency anemia 09/04/2014  ? Perioral dermatitis 09/04/2014  ? 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 B12 deficiency 07/02/2014  ? Cervical pain 07/02/2014  ? Arthralgia of lower leg 07/02/2014  ? Insomnia 07/02/2014  ? Depression, major, recurrent, mild (HCC) 07/02/2014  ? Dyslipidemia 07/02/2014  ? Dermatitis, eczematoid 07/02/2014  ? Genital herpes in women 07/02/2014  ? Gastric reflux 07/02/2014  ? Bariatric surgery status 07/02/2014  ? Herpes simplex 07/02/2014  ? H/O: pneumonia 07/02/2014  ? Eczema intertrigo 07/02/2014  ? Adult BMI 30+ 07/02/2014  ? OP (osteoporosis) 07/02/2014  ? 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 Perennial allergic rhinitis with  seasonal variation 07/02/2014  ? Basal cell papilloma 07/02/2014  ? Vitamin D deficiency 07/02/2014  ? ? ?Past Surgical History:  ?Procedure Laterality Date  ? ABDOMINAL HYSTERECTOMY  1994  ? CHOLECYSTECTOMY  2000  ? EYE SURGERY  2000  ? Cataract Removal  ? EYE SURGERY  2002  ? Cataract Removal  ? GASTRIC BYPASS  2000  ? Mini  ? GASTRIC BYPASS  2010  ? ? ?Family History  ?Problem Relation Age of Onset  ? Dementia Mother   ? COPD Mother   ? Osteoporosis Mother   ? Diabetes Father   ? Glaucoma Father   ?  Parkinson's disease Father   ? Glaucoma Brother   ? Heart Problems Brother   ? Heart attack Brother [id number]  ? Brain cancer Daughter   ?     Brain Tumor  ? Hypothyroidism Daughter   ? Cancer Daughter   ? Heart disease Maternal Aunt   ? Heart disease Maternal Uncle   ? Breast cancer Neg Hx   ? ? ?Social History  ? ?Tobacco Use  ? Smoking status: Former  ?  Packs/day: 0.50  ?  Years: 5.00  ?  Pack years: 2.50  ?  Types: Cigarettes  ?  Start date: 01/23/1985  ?  Quit date: 541992  ?  Years since quitting: 31.3  ? Smokeless tobacco: Never  ?Substance Use Topics  ? Alcohol use: Not Currently  ?  Comment: 3 drinks/year  ? ? ? ?Current Outpatient Medications:  ?  Calcium Carb-Cholecalciferol (CALCIUM/VITAMIN D PO), Take by mouth daily., Disp: , Rfl:  ?  cetirizine (ZYRTEC) 10 MG tablet, Take 10 mg by mouth daily as needed. , Disp: , Rfl:  ?  Cyanocobalamin (B-12) 1000 MCG SUBL, Place 1 tablet under the tongue daily. Every other day, Disp: 45 tablet, Rfl: 1 ?  cyclobenzaprine (FLEXERIL) 10 MG tablet, Take 1 tablet (10 mg total) by mouth at bedtime., Disp: 90 tablet, Rfl: 1 ?  escitalopram (LEXAPRO) 20 MG tablet, Take 1 tablet (20 mg total) by mouth daily., Disp: 90 tablet, Rfl: 1 ?  ferrous sulfate 325 (65 FE) MG tablet, Take 1 tablet (325 mg total) by mouth daily with breakfast., Disp: 30 tablet, Rfl: 12 ?  fluticasone (FLONASE) 50 MCG/ACT nasal spray, Place 2 sprays into both nostrils daily., Disp: 48 g, Rfl: 1 ?  ketoconazole (NIZORAL) 2 % cream, APPLY TO RASH TWICE DAILY, Disp: 90 g, Rfl: 0 ?  levothyroxine (SYNTHROID) 25 MCG tablet, Take 1 tablet (25 mcg total) by mouth daily before breakfast., Disp: 90 tablet, Rfl: 1 ?  lisdexamfetamine (VYVANSE) 50 MG capsule, Take 1 capsule (50 mg total) by mouth daily., Disp: 30 capsule, Rfl: 0 ?  meloxicam (MOBIC) 15 MG tablet, Take 15 mg by mouth daily., Disp: , Rfl:  ?  omeprazole (PRILOSEC) 40 MG capsule, TAKE 1 CAPSULE (40 MG TOTAL) BY MOUTH DAILY., Disp: 30 capsule, Rfl: 0 ?  PREMARIN  vaginal cream, Place 1 Applicatorful vaginally 3 (three) times a week., Disp: 42.5 g, Rfl: 12 ?  raloxifene (EVISTA) 60 MG tablet, TAKE 1 TABLET BY MOUTH EVERY DAY, Disp: 90 tablet, Rfl: 4 ?  triamcinolone cream (KENALOG) 0.1 %, Apply 1 application topically 2 (two) times daily. Mix with lotion at home, Disp: 453.6 g, Rfl: 0 ?  valACYclovir (VALTREX) 500 MG tablet, Take 1 tablet (500 mg total) by mouth 3 (three) times daily., Disp: 30 tablet, Rfl: 0 ?  Vitamin D, Ergocalciferol, (DRISDOL) 1.25 MG (50000  UNIT) CAPS capsule, Take 1 capsule (50,000 Units total) by mouth every 14 (fourteen) days., Disp: 6 capsule, Rfl: 1 ?  lisdexamfetamine (VYVANSE) 50 MG capsule, Take 1 capsule (50 mg total) by mouth daily., Disp: 30 capsule, Rfl: 0 ?  lisdexamfetamine (VYVANSE) 50 MG capsule, Take 1 capsule (50 mg total) by mouth daily., Disp: 30 capsule, Rfl: 0 ? ?Allergies  ?Allergen Reactions  ? Topamax  [Topiramate]   ?  severe depression  ? Trazodone   ?  Other reaction(s): Dizziness  ? ? ?I personally reviewed active problem list, medication list, allergies, family history, social history, health maintenance with the patient/caregiver today. ? ? ?ROS ? ?Constitutional: Negative for fever or weight change.  ?Respiratory: Negative for cough and shortness of breath.   ?Cardiovascular: Negative for chest pain or palpitations.  ?Gastrointestinal: Negative for abdominal pain, no bowel changes.  ?Musculoskeletal: Negative for gait problem or joint swelling.  ?Skin: Negative for rash.  ?Neurological: Negative for dizziness or headache.  ?No other specific complaints in a complete review of systems (except as listed in HPI above).  ? ?Objective ? ?Vitals:  ? 05/24/21 0939  ?BP: 118/74  ?Pulse: 100  ?Resp: 16  ?Temp: 98.1 ?F (36.7 ?C)  ?TempSrc: Oral  ?SpO2: 98%  ?Weight: 197 lb 1.6 oz (89.4 kg)  ?Height: 5\' 3"  (1.6 m)  ? ? ?Body mass index is 34.91 kg/m?. ? ?Physical Exam ? ?Constitutional: Patient appears well-developed and  well-nourished. Obese  No distress.  ?HEENT: head atraumatic, normocephalic, pupils equal and reactive to light, neck supple ?Cardiovascular: Normal rate, regular rhythm and normal heart sounds.  No murmur heard. No BLE ed

## 2021-05-24 ENCOUNTER — Encounter: Payer: Self-pay | Admitting: Family Medicine

## 2021-05-24 ENCOUNTER — Other Ambulatory Visit: Payer: Self-pay | Admitting: Family Medicine

## 2021-05-24 ENCOUNTER — Ambulatory Visit: Payer: Federal, State, Local not specified - PPO | Admitting: Family Medicine

## 2021-05-24 VITALS — BP 118/74 | HR 100 | Temp 98.1°F | Resp 16 | Ht 63.0 in | Wt 197.1 lb

## 2021-05-24 DIAGNOSIS — I471 Supraventricular tachycardia: Secondary | ICD-10-CM

## 2021-05-24 DIAGNOSIS — M62830 Muscle spasm of back: Secondary | ICD-10-CM

## 2021-05-24 DIAGNOSIS — E039 Hypothyroidism, unspecified: Secondary | ICD-10-CM

## 2021-05-24 DIAGNOSIS — A6009 Herpesviral infection of other urogenital tract: Secondary | ICD-10-CM

## 2021-05-24 DIAGNOSIS — L304 Erythema intertrigo: Secondary | ICD-10-CM

## 2021-05-24 DIAGNOSIS — F325 Major depressive disorder, single episode, in full remission: Secondary | ICD-10-CM | POA: Diagnosis not present

## 2021-05-24 DIAGNOSIS — B001 Herpesviral vesicular dermatitis: Secondary | ICD-10-CM

## 2021-05-24 DIAGNOSIS — E559 Vitamin D deficiency, unspecified: Secondary | ICD-10-CM | POA: Diagnosis not present

## 2021-05-24 DIAGNOSIS — D692 Other nonthrombocytopenic purpura: Secondary | ICD-10-CM | POA: Diagnosis not present

## 2021-05-24 DIAGNOSIS — J302 Other seasonal allergic rhinitis: Secondary | ICD-10-CM

## 2021-05-24 DIAGNOSIS — F5081 Binge eating disorder: Secondary | ICD-10-CM

## 2021-05-24 MED ORDER — LEVOTHYROXINE SODIUM 25 MCG PO TABS: 25.0000 ug | ORAL_TABLET | Freq: Every day | ORAL | 1 refills | Status: DC

## 2021-05-24 MED ORDER — LISDEXAMFETAMINE DIMESYLATE 50 MG PO CAPS: 50.0000 mg | ORAL_CAPSULE | Freq: Every day | ORAL | 0 refills | Status: DC

## 2021-05-24 MED ORDER — ESCITALOPRAM OXALATE 20 MG PO TABS: 20.0000 mg | ORAL_TABLET | Freq: Every day | ORAL | 1 refills | Status: DC

## 2021-05-24 MED ORDER — CYCLOBENZAPRINE HCL 10 MG PO TABS: 10.0000 mg | ORAL_TABLET | Freq: Every day | ORAL | 1 refills | Status: DC

## 2021-05-24 MED ORDER — VALACYCLOVIR HCL 500 MG PO TABS: 500.0000 mg | ORAL_TABLET | Freq: Three times a day (TID) | ORAL | 0 refills | Status: DC

## 2021-05-24 MED ORDER — KETOCONAZOLE 2 % EX CREA: TOPICAL_CREAM | CUTANEOUS | 0 refills | Status: DC

## 2021-05-27 ENCOUNTER — Ambulatory Visit: Payer: Federal, State, Local not specified - PPO | Admitting: Family Medicine

## 2021-05-30 ENCOUNTER — Other Ambulatory Visit: Payer: Self-pay | Admitting: Family Medicine

## 2021-05-30 DIAGNOSIS — K219 Gastro-esophageal reflux disease without esophagitis: Secondary | ICD-10-CM

## 2021-08-12 ENCOUNTER — Encounter: Payer: Self-pay | Admitting: Family Medicine

## 2021-08-17 ENCOUNTER — Ambulatory Visit: Payer: Federal, State, Local not specified - PPO | Admitting: Family Medicine

## 2021-08-17 NOTE — Progress Notes (Unsigned)
Name: Terri Castillo   MRN: [id number]    DOB: 09/27/1950   Date:08/18/2021       Progress Note  Subjective  Chief Complaint  Follow Up  HPI  Depression Mild in Remission:  Alxis has tried and failed a lot of medications including Prozac,  Duloxetine , Wellbutrin and Pristiq ( worked but too expensive) She has been on Lexapro for a while and is doing well. . She states since Vyvanse was started because of binge eating disorder 12/2016 and she states it has helped her energy level. She is under more stress, daughter recently diagnosed with breast cancer - she has been going to the appointments with her, eating more to cope with stress but emotionally feels okay    History of bariatric surgery: she had gastric bypass in 2010, her lowest weight was 180 lbs, but was gradually gained weight since, weight is stable in the 220's lbs range, she started [employer] on June 28 th, 2021 at a weight of 221 lbs and is down to 175 lbs after that she reached a plateau and stopped doing the diet and has been stressed and eating a lot again, weight went  up to 197.1 lbs  in May and today is down to 191.5 lbs in our office  but she would like to add a medication. She asked about Saxenda since weight at home has been around 198 lbs , discussed possible side effects. She denies personal history of pancreatitis or family history of thyroid cancer    Bing eating disorder:  Started on Vyvanse 10/2016  Weight was 226 lbs ,she has been compliant with medication.  She  started [employer] in June 2021 , starting weight ( prior to diet change ) was 221 lbs, weight  was stable around 170 lbs  for a while but gradually started to gain weight again since Fall 2022   B12 deficiency: she stopped taking supplementation, advised to resume a few times a week.   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 Taking Flexeril for muscle spasms every night and seems to help with symptoms, no radiculitis  Stable   Heart rhythm change/PSVT : she noticed her iwatch picked up Afib back in September 2019 she saw Dr. Saunders Revel had a holder monitor but did not show afib . No recent episodes of palpitation , and denies decrease in exercise tolerance. Unchanged   Hypothyroidism: last TSH was at goal again, continue current dose of levothyroxine, she states stable hair loss, no change in bowel movement or denies dry skin .   Senile purpura: stable on arms.Stable   Patient Active Problem List   Diagnosis Date Noted   PSVT (paroxysmal supraventricular tachycardia) ([place]) 05/03/2018   PVC (premature ventricular contraction) 05/03/2018   PAC (premature atrial contraction) 05/03/2018   Palpitations 02/04/2018   Inflammatory spondylopathy of lumbar region ([place]) 05/22/2017   Senile purpura ([place]) 08/11/2016   Facet arthropathy, lumbar 02/02/2016   Spondylisthesis 01/12/2016   Chronic bilateral low back pain 11/01/2015   Iron deficiency anemia 09/04/2014   Perioral dermatitis 09/04/2014   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   B12 deficiency 07/02/2014   Cervical pain 07/02/2014   Arthralgia of lower leg 07/02/2014   Insomnia 07/02/2014   Depression, major, recurrent, mild ([place]) 07/02/2014   Dyslipidemia 07/02/2014   Dermatitis, eczematoid 07/02/2014   Genital herpes in women  07/02/2014   Gastric reflux 07/02/2014   Bariatric surgery status 07/02/2014   Herpes simplex 07/02/2014   H/O: pneumonia 07/02/2014   Eczema intertrigo 07/02/2014   Adult BMI 30+ 07/02/2014   OP (osteoporosis) 07/02/2014   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   Perennial allergic rhinitis with seasonal variation 07/02/2014   Basal cell papilloma 07/02/2014   Vitamin D deficiency 07/02/2014    Past Surgical History:  Procedure Laterality Date   ABDOMINAL HYSTERECTOMY  1994   CHOLECYSTECTOMY  2000   EYE SURGERY  2000   Cataract  Removal   EYE SURGERY  2002   Cataract Removal   GASTRIC BYPASS  2000   Mini   GASTRIC BYPASS  2010    Family History  Problem Relation Age of Onset   Dementia Mother    COPD Mother    Osteoporosis Mother    Diabetes Father    Glaucoma Father    Parkinson's disease Father    Glaucoma Brother    Heart Problems Brother    Heart attack Brother 37   Brain cancer Daughter        Brain Tumor   Hypothyroidism Daughter    Cancer Daughter        breast   Heart disease Maternal Aunt    Heart disease Maternal Uncle    Breast cancer Neg Hx     Social History   Tobacco Use   Smoking status: Former    Packs/day: 0.50    Years: 5.00    Total pack years: 2.50    Types: Cigarettes    Start date: 01/23/1985    Quit date: 1992    Years since quitting: 31.5   Smokeless tobacco: Never  Substance Use Topics   Alcohol use: Not Currently    Comment: 3 drinks/year     Current Outpatient Medications:    Calcium Carb-Cholecalciferol (CALCIUM/VITAMIN D PO), Take by mouth daily., Disp: , Rfl:    cetirizine (ZYRTEC) 10 MG tablet, Take 10 mg by mouth daily as needed. , Disp: , Rfl:    cyclobenzaprine (FLEXERIL) 10 MG tablet, Take 1 tablet (10 mg total) by mouth at bedtime., Disp: 90 tablet, Rfl: 1   escitalopram (LEXAPRO) 20 MG tablet, Take 1 tablet (20 mg total) by mouth daily., Disp: 90 tablet, Rfl: 1   fluticasone (FLONASE) 50 MCG/ACT nasal spray, SPRAY 2 SPRAYS INTO EACH NOSTRIL EVERY DAY, Disp: 48 mL, Rfl: 1   ketoconazole (NIZORAL) 2 % cream, APPLY TO RASH TWICE DAILY, Disp: 90 g, Rfl: 0   levothyroxine (SYNTHROID) 25 MCG tablet, Take 1 tablet (25 mcg total) by mouth daily before breakfast., Disp: 90 tablet, Rfl: 1   omeprazole (PRILOSEC) 40 MG capsule, TAKE 1 CAPSULE (40 MG TOTAL) BY MOUTH DAILY., Disp: 90 capsule, Rfl: 0   PREMARIN vaginal cream, Place 1 Applicatorful vaginally 3 (three) times a week., Disp: 42.5 g, Rfl: 12   raloxifene (EVISTA) 60 MG tablet, TAKE 1 TABLET BY MOUTH  EVERY DAY, Disp: 90 tablet, Rfl: 4   triamcinolone cream (KENALOG) 0.1 %, Apply 1 application topically 2 (two) times daily. Mix with lotion at home, Disp: 453.6 g, Rfl: 0   valACYclovir (VALTREX) 500 MG tablet, Take 1 tablet (500 mg total) by mouth 3 (three) times daily., Disp: 30 tablet, Rfl: 0   Vitamin D, Ergocalciferol, (DRISDOL) 1.25 MG (50000 UNIT) CAPS capsule, Take 1 capsule (50,000 Units total) by mouth every 14 (fourteen) days., Disp: 6 capsule, Rfl: 1   Cyanocobalamin (B-12) 1000 MCG SUBL, Place  1 tablet under the tongue daily. Every other day (Patient not taking: Reported on 08/18/2021), Disp: 45 tablet, Rfl: 1   ferrous sulfate 325 (65 FE) MG tablet, Take 1 tablet (325 mg total) by mouth daily with breakfast. (Patient not taking: Reported on 08/18/2021), Disp: 30 tablet, Rfl: 12   lisdexamfetamine (VYVANSE) 50 MG capsule, Take 1 capsule (50 mg total) by mouth daily., Disp: 30 capsule, Rfl: 0   lisdexamfetamine (VYVANSE) 50 MG capsule, Take 1 capsule (50 mg total) by mouth daily., Disp: 30 capsule, Rfl: 0   lisdexamfetamine (VYVANSE) 50 MG capsule, Take 1 capsule (50 mg total) by mouth daily., Disp: 30 capsule, Rfl: 0  Allergies  Allergen Reactions   Topamax  [Topiramate]     severe depression   Trazodone     Other reaction(s): Dizziness    I personally reviewed active problem list, medication list, allergies, family history, social history, health maintenance with the patient/caregiver today.   ROS  Constitutional: Negative for fever, positive for  weight change.  Respiratory: Negative for cough and shortness of breath.   Cardiovascular: Negative for chest pain or palpitations.  Gastrointestinal: Negative for abdominal pain, no bowel changes.  Musculoskeletal: Negative for gait problem or joint swelling.  Skin: Negative for rash.  Neurological: Negative for dizziness or headache.  No other specific complaints in a complete review of systems (except as listed in HPI above).    Objective  Vitals:   08/18/21 0940  BP: 124/78  Pulse: 89  Resp: 16  Temp: 97.9 F (36.6 C)  TempSrc: Oral  SpO2: 97%  Weight: 191 lb 8 oz (86.9 kg)  Height: 5\' 3"  (1.6 m)    Body mass index is 33.92 kg/m.  Physical Exam  Constitutional: Patient appears well-developed and well-nourished. Obese  No distress.  HEENT: head atraumatic, normocephalic, pupils equal and reactive to light, neck supple Cardiovascular: Normal rate, regular rhythm and normal heart sounds.  No murmur heard. No BLE edema. Pulmonary/Chest: Effort normal and breath sounds normal. No respiratory distress. Abdominal: Soft.  There is no tenderness. Psychiatric: Patient has a normal mood and affect. behavior is normal. Judgment and thought content normal.    PHQ2/9:    08/18/2021    9:34 AM 05/24/2021    9:41 AM 02/24/2021    1:08 PM 12/15/2020    1:54 PM 11/18/2020    7:50 AM  Depression screen PHQ 2/9  Decreased Interest 0 0 0 0 0  Down, Depressed, Hopeless 0 0 0 0 0  PHQ - 2 Score 0 0 0 0 0  Altered sleeping 0 0 0 0 0  Tired, decreased energy 0 1 0 0 0  Change in appetite 0 3 0 0 0  Feeling bad or failure about yourself  0 1 0 0 0  Trouble concentrating 0 0 0 0 0  Moving slowly or fidgety/restless 0 0 0 0 0  Suicidal thoughts 0 0 0 0 0  PHQ-9 Score 0 5 0 0 0  Difficult doing work/chores Not difficult at all Not difficult at all Not difficult at all      phq 9 is negative   Fall Risk:    08/18/2021    9:34 AM 05/24/2021    9:32 AM 02/24/2021    1:08 PM 12/15/2020    1:54 PM 11/18/2020    7:49 AM  Fall Risk   Falls in the past year? 0 0 0 0 0  Number falls in past yr: 0  0  0  Injury with Fall? 0  0  0  Risk for fall due to : No Fall Risks No Fall Risks No Fall Risks  No Fall Risks  Follow up Falls prevention discussed;Education provided Falls prevention discussed Falls prevention discussed Falls prevention discussed Falls prevention discussed      Functional Status Survey: Is the  patient deaf or have difficulty hearing?: No Does the patient have difficulty seeing, even when wearing glasses/contacts?: No Does the patient have difficulty concentrating, remembering, or making decisions?: No Does the patient have difficulty walking or climbing stairs?: No Does the patient have difficulty dressing or bathing?: No Does the patient have difficulty doing errands alone such as visiting a doctor's office or shopping?: No    Assessment & Plan  1. Major depression in remission ([place])  Continue medication   2. PSVT (paroxysmal supraventricular tachycardia) ([facility])  Stable   3. Obesity (BMI 30.0-34.9)  - Liraglutide -Weight Management (SAXENDA) 18 MG/3ML SOPN; Inject 0.6-3 mg into the skin daily.  Dispense: 15 mL; Refill: 0  4. Senile purpura ([place])  Reassurance given   5. Hyperglycemia  - Hemoglobin A1c  6. Inflammatory spondylopathy of lumbar region ([place])   7. GERD without esophagitis   8. Vitamin D deficiency  - VITAMIN D 25 Hydroxy (Vit-D Deficiency, Fractures)  9. B12 deficiency  - Vitamin B12  10. Acquired hypothyroidism  - TSH  11. Binge eating disorder  - lisdexamfetamine (VYVANSE) 50 MG capsule; Take 1 capsule (50 mg total) by mouth daily.  Dispense: 30 capsule; Refill: 0 - lisdexamfetamine (VYVANSE) 50 MG capsule; Take 1 capsule (50 mg total) by mouth daily.  Dispense: 30 capsule; Refill: 0 - lisdexamfetamine (VYVANSE) 50 MG capsule; Take 1 capsule (50 mg total) by mouth daily.  Dispense: 30 capsule; Refill: 0  12. Bariatric surgery status   13. Osteoporosis without current pathological fracture, unspecified osteoporosis type   14. Primary osteoarthritis of right knee   15. Dyslipidemia  - Lipid panel  16. Long-term use of high-risk medication  - CBC with Differential/Platelet - COMPLETE METABOLIC PANEL WITH GFR  17. Spasm of muscle, back

## 2021-08-18 ENCOUNTER — Encounter: Payer: Self-pay | Admitting: Family Medicine

## 2021-08-18 ENCOUNTER — Ambulatory Visit: Payer: Federal, State, Local not specified - PPO | Admitting: Family Medicine

## 2021-08-18 VITALS — BP 124/78 | HR 89 | Temp 97.9°F | Resp 16 | Ht 63.0 in | Wt 191.5 lb

## 2021-08-18 DIAGNOSIS — Z9884 Bariatric surgery status: Secondary | ICD-10-CM

## 2021-08-18 DIAGNOSIS — E559 Vitamin D deficiency, unspecified: Secondary | ICD-10-CM

## 2021-08-18 DIAGNOSIS — I471 Supraventricular tachycardia, unspecified: Secondary | ICD-10-CM

## 2021-08-18 DIAGNOSIS — E669 Obesity, unspecified: Secondary | ICD-10-CM

## 2021-08-18 DIAGNOSIS — M810 Age-related osteoporosis without current pathological fracture: Secondary | ICD-10-CM

## 2021-08-18 DIAGNOSIS — D692 Other nonthrombocytopenic purpura: Secondary | ICD-10-CM

## 2021-08-18 DIAGNOSIS — F325 Major depressive disorder, single episode, in full remission: Secondary | ICD-10-CM | POA: Diagnosis not present

## 2021-08-18 DIAGNOSIS — F5081 Binge eating disorder: Secondary | ICD-10-CM

## 2021-08-18 DIAGNOSIS — M1711 Unilateral primary osteoarthritis, right knee: Secondary | ICD-10-CM

## 2021-08-18 DIAGNOSIS — E538 Deficiency of other specified B group vitamins: Secondary | ICD-10-CM

## 2021-08-18 DIAGNOSIS — K219 Gastro-esophageal reflux disease without esophagitis: Secondary | ICD-10-CM

## 2021-08-18 DIAGNOSIS — E785 Hyperlipidemia, unspecified: Secondary | ICD-10-CM

## 2021-08-18 DIAGNOSIS — E039 Hypothyroidism, unspecified: Secondary | ICD-10-CM

## 2021-08-18 DIAGNOSIS — E66811 Obesity, class 1: Secondary | ICD-10-CM

## 2021-08-18 DIAGNOSIS — R739 Hyperglycemia, unspecified: Secondary | ICD-10-CM

## 2021-08-18 DIAGNOSIS — F50819 Binge eating disorder, unspecified: Secondary | ICD-10-CM

## 2021-08-18 DIAGNOSIS — M4696 Unspecified inflammatory spondylopathy, lumbar region: Secondary | ICD-10-CM

## 2021-08-18 DIAGNOSIS — Z79899 Other long term (current) drug therapy: Secondary | ICD-10-CM

## 2021-08-18 DIAGNOSIS — M62830 Muscle spasm of back: Secondary | ICD-10-CM

## 2021-08-18 MED ORDER — SAXENDA 18 MG/3ML ~~LOC~~ SOPN: 0.6000 mg | PEN_INJECTOR | Freq: Every day | SUBCUTANEOUS | 0 refills | Status: DC

## 2021-08-18 MED ORDER — LISDEXAMFETAMINE DIMESYLATE 50 MG PO CAPS
50.0000 mg | ORAL_CAPSULE | Freq: Every day | ORAL | 0 refills | Status: DC
Start: 2021-08-18 — End: 2021-11-22

## 2021-08-24 ENCOUNTER — Ambulatory Visit: Payer: Federal, State, Local not specified - PPO | Admitting: Family Medicine

## 2021-09-01 ENCOUNTER — Other Ambulatory Visit: Payer: Self-pay | Admitting: Family Medicine

## 2021-09-01 DIAGNOSIS — K219 Gastro-esophageal reflux disease without esophagitis: Secondary | ICD-10-CM

## 2021-09-11 ENCOUNTER — Other Ambulatory Visit: Payer: Self-pay | Admitting: Family Medicine

## 2021-09-11 DIAGNOSIS — E559 Vitamin D deficiency, unspecified: Secondary | ICD-10-CM

## 2021-10-01 ENCOUNTER — Other Ambulatory Visit: Payer: Self-pay | Admitting: Family Medicine

## 2021-10-01 DIAGNOSIS — L304 Erythema intertrigo: Secondary | ICD-10-CM

## 2021-10-11 ENCOUNTER — Encounter: Payer: Self-pay | Admitting: Family Medicine

## 2021-11-04 ENCOUNTER — Other Ambulatory Visit: Payer: Self-pay | Admitting: Family Medicine

## 2021-11-04 DIAGNOSIS — Z1231 Encounter for screening mammogram for malignant neoplasm of breast: Secondary | ICD-10-CM

## 2021-11-04 DIAGNOSIS — F325 Major depressive disorder, single episode, in full remission: Secondary | ICD-10-CM

## 2021-11-06 ENCOUNTER — Telehealth: Payer: Federal, State, Local not specified - PPO | Admitting: Nurse Practitioner

## 2021-11-06 DIAGNOSIS — R399 Unspecified symptoms and signs involving the genitourinary system: Secondary | ICD-10-CM | POA: Diagnosis not present

## 2021-11-06 MED ORDER — CEPHALEXIN 500 MG PO CAPS: 500.0000 mg | ORAL_CAPSULE | Freq: Two times a day (BID) | ORAL | 0 refills | Status: AC

## 2021-11-06 NOTE — Progress Notes (Signed)

## 2021-11-06 NOTE — Progress Notes (Signed)
I have spent 5 minutes in review of e-visit questionnaire, review and updating patient chart, medical decision making and response to patient.  ° °Munir [place] W [place] Rauf, NP ° °  °

## 2021-11-20 ENCOUNTER — Encounter: Payer: Self-pay | Admitting: Family Medicine

## 2021-11-21 NOTE — Progress Notes (Unsigned)
Name: Terri Castillo   MRN: [id number]    DOB: 04/08/1950   Date:11/22/2021       Progress Note  Subjective  Chief Complaint  Follow up   I connected with  [name]  on 11/22/21 at 11:40 AM EDT by a video enabled telemedicine application and verified that I am speaking with the correct person using two identifiers.  I discussed the limitations of evaluation and management by telemedicine and the availability of in person appointments. The patient expressed understanding and agreed to proceed with the virtual visit  Staff also discussed with the patient that there may be a patient responsible charge related to this service. Patient Location: at home  Provider Location: [facility] Additional Individuals present: alone   HPI  Depression Mild in Remission:  Terri Castillo has tried and failed a lot of medications including Prozac,  Duloxetine , Wellbutrin and Pristiq ( worked but too expensive) She has been on Lexapro for a while and is doing well. . She states since Vyvanse was started because of binge eating disorder 12/2016  it has helped her energy level. She is still helping her daughter, diagnosed with breast cancer this Summer, but coping better with stress now   History of bariatric surgery: she had gastric bypass in 2010, her lowest weight was 180 lbs, but was gradually gained weight since, weight is stable in the 220's lbs range, she started [employer] on June 28 th, 2021 at a weight of 221 lbs and is down to 175 lbs after that she reached a plateau and stopped doing the diet and has been stressed and eating a lot again, weight went  up to 197.1 lbs  in May and down to  191.5 lbs in our office  back in July 2023  She asked about Saxenda since weight at home has been around 198 lbs ,we gave her a rx and she was able to take it for one month, she tolerated medication well and it suppressed her appetite, however ran out of medication due to nation wide shortage, she is willing to try [facility]  but not sure if covered by insurance   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 Taking Flexeril for muscle spasms every night and seems to help with symptoms, no radiculitis  Reminded her not to take Meloxicam  Heart rhythm change/PSVT : she noticed her iwatch picked up Afib back in September 2019 she saw Dr. Okey Dupre had a holder monitor but did not show afib . No recent episodes of palpitation , and denies decrease in exercise tolerance. Doing well lately   Hypothyroidism: last TSH was at goal again, continue current dose of levothyroxine, she states stable hair loss, no change in bowel movement or denies dry skin .she needs to stop by to have labs done    Senile purpura: reassurance  given   Patient Active Problem List   Diagnosis Date Noted   PSVT (paroxysmal supraventricular tachycardia) 05/03/2018   PVC (premature ventricular contraction) 05/03/2018   PAC (premature atrial contraction) 05/03/2018   Palpitations 02/04/2018   Inflammatory spondylopathy of lumbar region (HCC) 05/22/2017   Senile purpura (HCC) 08/11/2016   Facet arthropathy, lumbar 02/02/2016   Spondylisthesis 01/12/2016   Chronic bilateral low back pain 11/01/2015   Iron deficiency anemia 09/04/2014   Perioral dermatitis 09/04/2014   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   B12 deficiency 07/02/2014   Cervical pain 07/02/2014   Arthralgia of lower leg 07/02/2014    Insomnia 07/02/2014   Depression, major, recurrent, mild (HCC) 07/02/2014   Dyslipidemia 07/02/2014   Dermatitis, eczematoid 07/02/2014   Genital herpes in women 07/02/2014   Gastric reflux 07/02/2014   Bariatric surgery status 07/02/2014   Herpes simplex 07/02/2014   H/O: pneumonia 07/02/2014   Eczema intertrigo 07/02/2014   Adult BMI 30+ 07/02/2014   OP (osteoporosis) 07/02/2014   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   Perennial allergic rhinitis with seasonal variation 07/02/2014   Basal cell papilloma 07/02/2014   Vitamin D deficiency 07/02/2014    Past Surgical History:  Procedure Laterality Date   ABDOMINAL HYSTERECTOMY  1994   CHOLECYSTECTOMY  2000   EYE SURGERY  2000   Cataract Removal   EYE SURGERY  2002   Cataract Removal   GASTRIC BYPASS  2000   Mini   GASTRIC BYPASS  2010    Family History  Problem Relation Age of Onset   Dementia Mother    COPD Mother    Osteoporosis Mother    Diabetes Father    Glaucoma Father    Parkinson's disease Father    Glaucoma Brother    Heart Problems Brother    Heart attack Brother [id number]   Brain cancer Daughter        Brain Tumor   Hypothyroidism Daughter    Cancer Daughter        breast   Heart disease Maternal Aunt    Heart disease Maternal Uncle    Breast cancer Neg Hx     Social History   Socioeconomic History   Marital status: Divorced    Spouse name: Not on file   Number of children: 2   Years of education: Not on file   Highest education level: Some college, no degree  Occupational History   Not on file  Tobacco Use   Smoking status: Former    Packs/day: 0.50    Years: 5.00    Total pack years: 2.50    Types: Cigarettes    Start date: 01/23/1985    Quit date: 1992    Years since quitting: 31.8   Smokeless tobacco: Never  Vaping Use   Vaping Use: Never used  Substance and Sexual Activity   Alcohol use: Not Currently    Comment: 3 drinks/year   Drug use: No   Sexual activity: Not Currently    Partners: Male   Other Topics Concern   Not on file  Social History Narrative   Not on file   Social Determinants of Health   Financial Resource Strain: Low Risk  (11/18/2020)   Overall Financial Resource Strain (CARDIA)    Difficulty of Paying Living Expenses: Not hard at all  Food Insecurity: No Food Insecurity (11/18/2020)   Hunger Vital Sign    Worried About Running Out of Food in the Last Year: Never true    Ran Out of Food in the Last  Year: Never true  Transportation Needs: No Transportation Needs (11/18/2020)   PRAPARE - Administrator, Civil Service (Medical): No    Lack of Transportation (Non-Medical): No  Physical Activity: Inactive (11/18/2020)   Exercise Vital Sign    Days of Exercise per Week: 0 days    Minutes of Exercise per Session: 0 min  Stress: No Stress Concern Present (11/18/2020)   [employer] of Occupational Health - Occupational Stress Questionnaire    Feeling of Stress : Not at all  Social Connections: Socially Isolated (11/18/2020)   Social Connection and Isolation Panel [NHANES]    Frequency of Communication with Friends and Family: More than three times a week    Frequency of Social Gatherings with Friends and Family: Once a week    Attends Religious Services: Never    Database administrator or Organizations: No    Attends Banker Meetings: Never    Marital Status: Divorced  Catering manager Violence: Not At Risk (11/18/2020)   Humiliation, Afraid, Rape, and Kick questionnaire    Fear of Current or Ex-Partner: No    Emotionally Abused: No    Physically Abused: No    Sexually Abused: No     Current Outpatient Medications:    Calcium Carb-Cholecalciferol (CALCIUM/VITAMIN D PO), Take by mouth daily., Disp: , Rfl:    cetirizine (ZYRTEC) 10 MG tablet, Take 10 mg by mouth daily as needed. , Disp: , Rfl:    cyclobenzaprine (FLEXERIL) 10 MG tablet, Take 1 tablet (10 mg total) by mouth at bedtime., Disp: 90 tablet, Rfl: 1   escitalopram  (LEXAPRO) 20 MG tablet, TAKE 1 TABLET BY MOUTH EVERY DAY, Disp: 90 tablet, Rfl: 1   fluticasone (FLONASE) 50 MCG/ACT nasal spray, SPRAY 2 SPRAYS INTO EACH NOSTRIL EVERY DAY, Disp: 48 mL, Rfl: 1   ketoconazole (NIZORAL) 2 % cream, APPLY TO AFFECTED AREA TWICE A DAY FOR RASH, Disp: 90 g, Rfl: 0   ketoconazole (NIZORAL) 2 % cream, Apply topically daily., Disp: , Rfl:    levothyroxine (SYNTHROID) 25 MCG tablet, Take 1 tablet (25 mcg total) by mouth daily before breakfast., Disp: 90 tablet, Rfl: 1   Liraglutide -Weight Management (SAXENDA) 18 MG/3ML SOPN, Inject 0.6-3 mg into the skin daily., Disp: 15 mL, Rfl: 0   meloxicam (MOBIC) 15 MG tablet, Take 15 mg by mouth daily., Disp: , Rfl:    omeprazole (PRILOSEC) 40 MG capsule, TAKE 1 CAPSULE (40 MG TOTAL) BY MOUTH DAILY., Disp: 90 capsule, Rfl: 1   PREMARIN vaginal cream, Place 1 Applicatorful vaginally 3 (three) times a week., Disp: 42.5 g, Rfl: 12   raloxifene (EVISTA) 60 MG tablet, TAKE 1 TABLET BY MOUTH EVERY DAY, Disp: 90 tablet, Rfl: 4   triamcinolone cream (KENALOG) 0.1 %, Apply 1 application topically 2 (two) times daily. Mix with lotion at home, Disp: 453.6 g, Rfl: 0   valACYclovir (VALTREX) 500 MG tablet, Take 1 tablet (500 mg total) by mouth 3 (three) times daily., Disp: 30 tablet, Rfl: 0   Vitamin D, Ergocalciferol, (DRISDOL) 1.25 MG (50000 UNIT) CAPS capsule, TAKE 1 CAPSULE (50,000 UNITS TOTAL) BY MOUTH EVERY 7 (SEVEN) DAYS, Disp: 12 capsule, Rfl: 3   Cyanocobalamin (B-12) 1000 MCG SUBL, Place 1 tablet under the tongue daily. Every other day (Patient not taking: Reported on 08/18/2021), Disp: 45 tablet, Rfl: 1   ferrous sulfate 325 (65 FE) MG tablet, Take 1 tablet (325 mg total) by mouth daily with breakfast. (Patient not taking: Reported on  08/18/2021), Disp: 30 tablet, Rfl: 12   lisdexamfetamine (VYVANSE) 50 MG capsule, Take 1 capsule (50 mg total) by mouth daily., Disp: 30 capsule, Rfl: 0   lisdexamfetamine (VYVANSE) 50 MG capsule, Take 1  capsule (50 mg total) by mouth daily., Disp: 30 capsule, Rfl: 0   lisdexamfetamine (VYVANSE) 50 MG capsule, Take 1 capsule (50 mg total) by mouth daily., Disp: 30 capsule, Rfl: 0  Allergies  Allergen Reactions   Topamax  [Topiramate]     severe depression   Trazodone     Other reaction(s): Dizziness    I personally reviewed active problem list, medication list, allergies, family history, social history, health maintenance with the patient/caregiver today.   ROS  Ten systems reviewed and is negative except as mentioned in HPI    Objective  Virtual encounter, vitals not obtained.  There is no height or weight on file to calculate BMI.  Physical Exam  Awake, alert and oriented   PHQ2/9:    11/22/2021    9:41 AM 08/18/2021    9:34 AM 05/24/2021    9:41 AM 02/24/2021    1:08 PM 12/15/2020    1:54 PM  Depression screen PHQ 2/9  Decreased Interest 0 0 0 0 0  Down, Depressed, Hopeless 0 0 0 0 0  PHQ - 2 Score 0 0 0 0 0  Altered sleeping 0 0 0 0 0  Tired, decreased energy 0 0 1 0 0  Change in appetite 0 0 3 0 0  Feeling bad or failure about yourself  0 0 1 0 0  Trouble concentrating 0 0 0 0 0  Moving slowly or fidgety/restless 0 0 0 0 0  Suicidal thoughts 0 0 0 0 0  PHQ-9 Score 0 0 5 0 0  Difficult doing work/chores  Not difficult at all Not difficult at all Not difficult at all    PHQ-2/9 Result is negative.    Fall Risk:    11/22/2021    9:41 AM 08/18/2021    9:34 AM 05/24/2021    9:32 AM 02/24/2021    1:08 PM 12/15/2020    1:54 PM  Fall Risk   Falls in the past year? 0 0 0 0 0  Number falls in past yr:  0  0   Injury with Fall?  0  0   Risk for fall due to : No Fall Risks No Fall Risks No Fall Risks No Fall Risks   Follow up Falls prevention discussed;Education provided;Falls evaluation completed Falls prevention discussed;Education provided Falls prevention discussed Falls prevention discussed Falls prevention discussed     Assessment & Plan  1. Binge eating  disorder  - lisdexamfetamine (VYVANSE) 50 MG capsule; Take 1 capsule (50 mg total) by mouth daily.  Dispense: 30 capsule; Refill: 0 - lisdexamfetamine (VYVANSE) 50 MG capsule; Take 1 capsule (50 mg total) by mouth daily.  Dispense: 30 capsule; Refill: 0 - lisdexamfetamine (VYVANSE) 50 MG capsule; Take 1 capsule (50 mg total) by mouth daily.  Dispense: 30 capsule; Refill: 0  2. Acquired hypothyroidism  - levothyroxine (SYNTHROID) 25 MCG tablet; Take 1 tablet (25 mcg total) by mouth daily before breakfast.  Dispense: 90 tablet; Refill: 0  3. Osteoporosis without current pathological fracture, unspecified osteoporosis type  - aloxifene (EVISTA) 60 MG tablet; Take 1 tablet (60 mg total) by mouth daily.  Dispense: 90 tablet; Refill: 4  4. Spasm of muscle, back  - cyclobenzaprine (FLEXERIL) 10 MG tablet; Take 1 tablet (10 mg total) by  mouth at bedtime.  Dispense: 90 tablet; Refill: 1   5. Senile purpura (HCC)   6. Bariatric surgery status   7. Morbid obesity (HCC)  - Semaglutide-Weight Management 0.25 MG/0.5ML SOAJ; Inject 0.25 mg into the skin once a week for 28 days.  Dispense: 2 mL; Refill: 0 - Semaglutide-Weight Management 0.5 MG/0.5ML SOAJ; Inject 0.5 mg into the skin once a week for 28 days.  Dispense: 2 mL; Refill: 0 - Semaglutide-Weight Management 1 MG/0.5ML SOAJ; Inject 1 mg into the skin once a week for 28 days.  Dispense: 2 mL; Refill: 0 - Semaglutide-Weight Management 1.7 MG/0.75ML SOAJ; Inject 1.7 mg into the skin once a week for 28 days.  Dispense: 3 mL; Refill: 0 - Semaglutide-Weight Management 2.4 MG/0.75ML SOAJ; Inject 2.4 mg into the skin once a week for 28 days.  Dispense: 3 mL; Refill: 0  I discussed the assessment and treatment plan with the patient. The patient was provided an opportunity to ask questions and all were answered. The patient agreed with the plan and demonstrated an understanding of the instructions.  The patient was advised to call back or seek an  in-person evaluation if the symptoms worsen or if the condition fails to improve as anticipated.  I provided 25  minutes of non-face-to-face time during this encounter.

## 2021-11-22 ENCOUNTER — Telehealth (INDEPENDENT_AMBULATORY_CARE_PROVIDER_SITE_OTHER): Payer: Federal, State, Local not specified - PPO | Admitting: Family Medicine

## 2021-11-22 ENCOUNTER — Encounter: Payer: Self-pay | Admitting: Family Medicine

## 2021-11-22 DIAGNOSIS — F50819 Binge eating disorder, unspecified: Secondary | ICD-10-CM

## 2021-11-22 DIAGNOSIS — M62830 Muscle spasm of back: Secondary | ICD-10-CM

## 2021-11-22 DIAGNOSIS — F5081 Binge eating disorder: Secondary | ICD-10-CM | POA: Diagnosis not present

## 2021-11-22 DIAGNOSIS — E039 Hypothyroidism, unspecified: Secondary | ICD-10-CM

## 2021-11-22 DIAGNOSIS — M810 Age-related osteoporosis without current pathological fracture: Secondary | ICD-10-CM

## 2021-11-22 DIAGNOSIS — Z9884 Bariatric surgery status: Secondary | ICD-10-CM

## 2021-11-22 DIAGNOSIS — Z6830 Body mass index (BMI) 30.0-30.9, adult: Secondary | ICD-10-CM

## 2021-11-22 DIAGNOSIS — D692 Other nonthrombocytopenic purpura: Secondary | ICD-10-CM

## 2021-11-22 MED ORDER — LISDEXAMFETAMINE DIMESYLATE 50 MG PO CAPS: 50.0000 mg | ORAL_CAPSULE | Freq: Every day | ORAL | 0 refills | Status: DC

## 2021-11-22 MED ORDER — SEMAGLUTIDE-WEIGHT MANAGEMENT 2.4 MG/0.75ML ~~LOC~~ SOAJ: 2.4000 mg | SUBCUTANEOUS | 0 refills | Status: DC

## 2021-11-22 MED ORDER — RALOXIFENE HCL 60 MG PO TABS: 60.0000 mg | ORAL_TABLET | Freq: Every day | ORAL | 4 refills | Status: DC

## 2021-11-22 MED ORDER — LEVOTHYROXINE SODIUM 25 MCG PO TABS: 25.0000 ug | ORAL_TABLET | Freq: Every day | ORAL | 0 refills | Status: DC

## 2021-11-22 MED ORDER — SEMAGLUTIDE-WEIGHT MANAGEMENT 1 MG/0.5ML ~~LOC~~ SOAJ: 1.0000 mg | SUBCUTANEOUS | 0 refills | Status: AC

## 2021-11-22 MED ORDER — CYCLOBENZAPRINE HCL 10 MG PO TABS: 10.0000 mg | ORAL_TABLET | Freq: Every day | ORAL | 1 refills | Status: DC

## 2021-11-22 MED ORDER — SEMAGLUTIDE-WEIGHT MANAGEMENT 0.25 MG/0.5ML ~~LOC~~ SOAJ
0.2500 mg | SUBCUTANEOUS | 0 refills | Status: DC
  Filled 2021-11-26: qty 2, 28d supply, fill #0

## 2021-11-22 MED ORDER — SEMAGLUTIDE-WEIGHT MANAGEMENT 0.5 MG/0.5ML ~~LOC~~ SOAJ: 0.5000 mg | SUBCUTANEOUS | 0 refills | Status: DC

## 2021-11-22 MED ORDER — SEMAGLUTIDE-WEIGHT MANAGEMENT 1.7 MG/0.75ML ~~LOC~~ SOAJ: 1.7000 mg | SUBCUTANEOUS | 0 refills | Status: AC

## 2021-11-24 ENCOUNTER — Other Ambulatory Visit: Payer: Self-pay | Admitting: Family Medicine

## 2021-11-26 ENCOUNTER — Other Ambulatory Visit (HOSPITAL_COMMUNITY): Payer: Self-pay

## 2021-11-26 ENCOUNTER — Encounter: Payer: Self-pay | Admitting: Family Medicine

## 2021-12-01 LAB — COMPLETE METABOLIC PANEL WITH GFR
AG Ratio: 1.2 (calc) (ref 1.0–2.5)
ALT: 17 U/L (ref 6–29)
AST: 18 U/L (ref 10–35)
Albumin: 3.6 g/dL (ref 3.6–5.1)
Alkaline phosphatase (APISO): 90 U/L (ref 37–153)
BUN: 11 mg/dL (ref 7–25)
CO2: 30 mmol/L (ref 20–32)
Calcium: 8.8 mg/dL (ref 8.6–10.4)
Chloride: 106 mmol/L (ref 98–110)
Creat: 0.58 mg/dL (ref 0.50–1.05)
Globulin: 2.9 g/dL (calc) (ref 1.9–3.7)
Glucose, Bld: 86 mg/dL (ref 65–99)
Potassium: 4.6 mmol/L (ref 3.5–5.3)
Sodium: 142 mmol/L (ref 135–146)
Total Bilirubin: 0.8 mg/dL (ref 0.2–1.2)
Total Protein: 6.5 g/dL (ref 6.1–8.1)
eGFR: 98 mL/min/{1.73_m2} (ref >=60)

## 2021-12-01 LAB — CBC WITH DIFFERENTIAL/PLATELET
Absolute Monocytes: 447 cells/uL (ref 200–950)
Basophils Absolute: 22 cells/uL (ref 0–200)
Basophils Relative: 0.5 %
Eosinophils Absolute: 129 cells/uL (ref 15–500)
Eosinophils Relative: 3 %
HCT: 39.6 % (ref 35.0–45.0)
Hemoglobin: 13.2 g/dL (ref 11.7–15.5)
Lymphs Abs: 1213 cells/uL (ref 850–3900)
MCH: 31 pg (ref 27.0–33.0)
MCHC: 33.3 g/dL (ref 32.0–36.0)
MCV: 93 fL (ref 80.0–100.0)
MPV: 9.4 fL (ref 7.5–12.5)
Monocytes Relative: 10.4 %
Neutro Abs: 2490 cells/uL (ref 1500–7800)
Neutrophils Relative %: 57.9 %
Platelets: 334 10*3/uL (ref 140–400)
RBC: 4.26 10*6/uL (ref 3.80–5.10)
RDW: 12 % (ref 11.0–15.0)
Total Lymphocyte: 28.2 %
WBC: 4.3 10*3/uL (ref 3.8–10.8)

## 2021-12-01 LAB — VITAMIN B12: Vitamin B-12: 414 pg/mL (ref 200–1100)

## 2021-12-01 LAB — LIPID PANEL
Cholesterol: 173 mg/dL (ref <200)
HDL: 45 mg/dL — ABNORMAL LOW (ref >=50)
LDL Cholesterol (Calc): 100 mg/dL (calc) — ABNORMAL HIGH
Non-HDL Cholesterol (Calc): 128 mg/dL (calc) (ref <130)
Total CHOL/HDL Ratio: 3.8 (calc) (ref <5.0)
Triglycerides: 190 mg/dL — ABNORMAL HIGH (ref <150)

## 2021-12-01 LAB — HEMOGLOBIN A1C
Hgb A1c MFr Bld: 5.4 % of total Hgb (ref <5.7)
Mean Plasma Glucose: 108 mg/dL
eAG (mmol/L): 6 mmol/L

## 2021-12-01 LAB — TSH: TSH: 3.81 mIU/L (ref 0.40–4.50)

## 2021-12-01 LAB — VITAMIN D 25 HYDROXY (VIT D DEFICIENCY, FRACTURES): Vit D, 25-Hydroxy: 33 ng/mL (ref 30–100)

## 2021-12-12 ENCOUNTER — Ambulatory Visit
Admission: RE | Admit: 2021-12-12 | Discharge: 2021-12-12 | Disposition: A | Discharge status: Home / Self Care | Payer: Federal, State, Local not specified - PPO | Source: Ambulatory Visit | Attending: Family Medicine | Admitting: Family Medicine

## 2021-12-12 DIAGNOSIS — Z1231 Encounter for screening mammogram for malignant neoplasm of breast: Secondary | ICD-10-CM | POA: Diagnosis present

## 2021-12-19 ENCOUNTER — Other Ambulatory Visit: Payer: Self-pay | Admitting: Family Medicine

## 2021-12-20 ENCOUNTER — Other Ambulatory Visit (HOSPITAL_COMMUNITY): Payer: Self-pay

## 2021-12-20 MED ORDER — WEGOVY 0.25 MG/0.5ML ~~LOC~~ SOAJ
0.2500 mg | SUBCUTANEOUS | 0 refills | Status: DC
  Filled 2021-12-20 – 2022-02-06 (×2): qty 2, 28d supply, fill #0

## 2021-12-20 NOTE — Telephone Encounter (Signed)
Appt schd.

## 2021-12-22 ENCOUNTER — Other Ambulatory Visit (HOSPITAL_COMMUNITY): Payer: Self-pay

## 2022-01-01 ENCOUNTER — Other Ambulatory Visit: Payer: Self-pay | Admitting: Family Medicine

## 2022-01-01 DIAGNOSIS — F5081 Binge eating disorder: Secondary | ICD-10-CM

## 2022-01-02 ENCOUNTER — Other Ambulatory Visit: Payer: Self-pay

## 2022-01-02 MED ORDER — SAXENDA 18 MG/3ML ~~LOC~~ SOPN
3.0000 mg | PEN_INJECTOR | Freq: Every day | SUBCUTANEOUS | 0 refills | Status: DC
  Filled 2022-01-02: qty 15, 30d supply, fill #0

## 2022-01-03 ENCOUNTER — Encounter: Payer: Self-pay | Admitting: Family Medicine

## 2022-01-04 ENCOUNTER — Other Ambulatory Visit: Payer: Self-pay

## 2022-01-04 ENCOUNTER — Other Ambulatory Visit: Payer: Self-pay | Admitting: Family Medicine

## 2022-01-04 MED ORDER — INSULIN PEN NEEDLE 32G X 6 MM MISC: 1.0000 | Freq: Every day | 1 refills | Status: DC

## 2022-01-25 ENCOUNTER — Other Ambulatory Visit (HOSPITAL_COMMUNITY): Payer: Self-pay

## 2022-01-31 ENCOUNTER — Ambulatory Visit: Payer: Federal, State, Local not specified - PPO | Admitting: Family Medicine

## 2022-02-06 ENCOUNTER — Other Ambulatory Visit (HOSPITAL_COMMUNITY): Payer: Self-pay

## 2022-02-08 ENCOUNTER — Other Ambulatory Visit: Payer: Self-pay

## 2022-02-10 ENCOUNTER — Other Ambulatory Visit (HOSPITAL_COMMUNITY): Payer: Self-pay

## 2022-02-23 ENCOUNTER — Encounter: Payer: Self-pay | Admitting: Family Medicine

## 2022-02-23 ENCOUNTER — Ambulatory Visit: Payer: Federal, State, Local not specified - PPO | Admitting: Family Medicine

## 2022-02-23 ENCOUNTER — Other Ambulatory Visit: Payer: Self-pay

## 2022-02-23 ENCOUNTER — Other Ambulatory Visit: Payer: Self-pay | Admitting: Family Medicine

## 2022-02-23 VITALS — BP 118/68 | HR 91 | Resp 16 | Ht 63.0 in | Wt 208.0 lb

## 2022-02-23 DIAGNOSIS — D692 Other nonthrombocytopenic purpura: Secondary | ICD-10-CM

## 2022-02-23 DIAGNOSIS — F325 Major depressive disorder, single episode, in full remission: Secondary | ICD-10-CM | POA: Diagnosis not present

## 2022-02-23 DIAGNOSIS — M4696 Unspecified inflammatory spondylopathy, lumbar region: Secondary | ICD-10-CM

## 2022-02-23 DIAGNOSIS — M62830 Muscle spasm of back: Secondary | ICD-10-CM

## 2022-02-23 DIAGNOSIS — I471 Supraventricular tachycardia, unspecified: Secondary | ICD-10-CM

## 2022-02-23 DIAGNOSIS — M8580 Other specified disorders of bone density and structure, unspecified site: Secondary | ICD-10-CM

## 2022-02-23 DIAGNOSIS — Z780 Asymptomatic menopausal state: Secondary | ICD-10-CM | POA: Insufficient documentation

## 2022-02-23 DIAGNOSIS — E559 Vitamin D deficiency, unspecified: Secondary | ICD-10-CM

## 2022-02-23 DIAGNOSIS — F5081 Binge eating disorder: Secondary | ICD-10-CM

## 2022-02-23 DIAGNOSIS — E039 Hypothyroidism, unspecified: Secondary | ICD-10-CM

## 2022-02-23 DIAGNOSIS — E785 Hyperlipidemia, unspecified: Secondary | ICD-10-CM

## 2022-02-23 DIAGNOSIS — K219 Gastro-esophageal reflux disease without esophagitis: Secondary | ICD-10-CM

## 2022-02-23 DIAGNOSIS — Z9884 Bariatric surgery status: Secondary | ICD-10-CM

## 2022-02-23 DIAGNOSIS — Z1211 Encounter for screening for malignant neoplasm of colon: Secondary | ICD-10-CM

## 2022-02-23 MED ORDER — LEVOTHYROXINE SODIUM 25 MCG PO TABS: 25.0000 ug | ORAL_TABLET | Freq: Every day | ORAL | 0 refills | Status: DC

## 2022-02-23 MED ORDER — LISDEXAMFETAMINE DIMESYLATE 50 MG PO CAPS
50.0000 mg | ORAL_CAPSULE | Freq: Every day | ORAL | 0 refills | Status: DC
  Filled 2022-02-23 – 2022-03-18 (×2): qty 90, 90d supply, fill #0

## 2022-02-23 NOTE — Telephone Encounter (Signed)
Appt sch'd for today

## 2022-02-23 NOTE — Progress Notes (Signed)
Cancelled per Dr Ancil Boozer due to [place] shortage.

## 2022-02-23 NOTE — Progress Notes (Signed)
Name: Terri Castillo   MRN: [id number]    DOB: [birth year]   Date:02/23/2022       Progress Note  Subjective  Chief Complaint  Medication Refill  HPI  Depression Mild in Remission:  Geanette has tried and failed a lot of medications including Prozac,  Duloxetine , Wellbutrin and Pristiq ( worked but too expensive) She has been on Lexapro for a while and is doing well. . She states since Vyvanse was started because of binge eating disorder 12/2016  it has helped her energy level. She states struggling since she has been out of Vyvanse due to [employer]. She will try to send it to [facility] today    History of bariatric surgery: she had gastric bypass in 2010, her lowest weight was 180 lbs, but was gradually gained weight since, weight is stable in the 220's lbs range, she started [employer] on June 28 th, 2021 at a weight of 221 lbs and is down to 175 lbs after that she reached a plateau and stopped doing the diet and has been stressed and eating a lot again, weight went  up to 197.1 lbs  in May 23 and down to  191.5 lbs in our office  back in July 2023  She asked about Saxenda since weight at home has been around 198 lbs ,we gave her a rx and she was able to take it for one month, she tolerated medication well and it suppressed her appetite, however ran out of medication due to nation wide shortage, we switched to [facility] and took for one month but there was a shortage and another gap, back on Saxenda 3 mg and is switching back to [place] today , however weight is trending up likely from gap on GLP-1 agonist but also because of nationwide shortage of Vyvanse that helps control her binge eating disorder and also increases her level of physical activity    Bing eating disorder:  Started on Vyvanse 10/2016  Weight was 226 lbs ,she has been compliant with medication.  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 Taking Flexeril for muscle spasms every night and seems to help with symptoms, no radiculitis  Reminded her not to take Meloxicam, but she still takes it prn - Ortho gave it to her.   Heart rhythm change/PSVT : she noticed her iwatch picked up Afib back in September 2019 she saw Dr. Saunders Revel had a holder monitor but did not show afib . No recent episodes of palpitation , and denies decrease in exercise tolerance. No problems at this time   Hypothyroidism: last TSH was at goal again, continue current dose of levothyroxine, no change in bowel movements or dysphagia.   Senile purpura: reassurance given . 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   B12 deficiency 07/02/2014   Cervical pain 07/02/2014   Arthralgia of lower leg 07/02/2014   Insomnia 07/02/2014   Depression, major, recurrent, mild (HCC) 07/02/2014   Dyslipidemia 07/02/2014   Dermatitis, eczematoid 07/02/2014    Genital herpes in women 07/02/2014   Gastric reflux 07/02/2014   Bariatric surgery status 07/02/2014   Herpes simplex 07/02/2014   H/O: pneumonia 07/02/2014   Eczema intertrigo 07/02/2014   Adult BMI 30+ 07/02/2014   OP (osteoporosis) 07/02/2014   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   Perennial allergic rhinitis with seasonal variation 07/02/2014   Basal cell papilloma 07/02/2014   Vitamin D deficiency 07/02/2014    Past Surgical History:  Procedure Laterality Date   ABDOMINAL HYSTERECTOMY  1994   CHOLECYSTECTOMY  2000   EYE SURGERY  2000   Cataract Removal   EYE SURGERY  2002   Cataract Removal   GASTRIC BYPASS  2000   Mini   GASTRIC BYPASS  2010    Family History  Problem Relation Age of Onset   Dementia Mother    COPD Mother    Osteoporosis Mother    Diabetes Father    Glaucoma Father    Parkinson's disease Father    Breast cancer Daughter    Brain cancer Daughter        Brain Tumor   Hypothyroidism Daughter    Cancer Daughter        breast   Heart disease Maternal Aunt    Heart disease Maternal Uncle    Glaucoma Brother    Heart Problems Brother    Heart attack Brother 12    Social History   Tobacco Use   Smoking status: Former    Packs/day: 0.50    Years: 5.00    Total pack years: 2.50    Types: Cigarettes    Start date: 01/23/1985    Quit date: 1992    Years since quitting: 32.1   Smokeless tobacco: Never  Substance Use Topics   Alcohol use: Not Currently    Comment: 3 drinks/year     Current Outpatient Medications:    Calcium Carb-Cholecalciferol (CALCIUM/VITAMIN D PO), Take by mouth daily., Disp: , Rfl:    cetirizine (ZYRTEC) 10 MG tablet, Take 10 mg by mouth daily as needed. , Disp: , Rfl:    Cyanocobalamin (B-12) 1000 MCG SUBL, Place 1 tablet under the tongue daily. Every other day, Disp: 45 tablet, Rfl: 1   cyclobenzaprine (FLEXERIL) 10 MG tablet, Take 1 tablet (10 mg total) by mouth at bedtime., Disp: 90 tablet, Rfl: 1   escitalopram (LEXAPRO)  20 MG tablet, TAKE 1 TABLET BY MOUTH EVERY DAY, Disp: 90 tablet, Rfl: 1   fluticasone (FLONASE) 50 MCG/ACT nasal spray, SPRAY 2 SPRAYS INTO EACH NOSTRIL EVERY DAY, Disp: 48 mL, Rfl: 1   Insulin Pen Needle 32G X 6 MM MISC, 1 each by Does not apply route daily at 12 noon., Disp: 100 each, Rfl: 1   ketoconazole (NIZORAL) 2 % cream, APPLY TO AFFECTED AREA TWICE A DAY FOR RASH, Disp: 90 g,  Rfl: 0   ketoconazole (NIZORAL) 2 % cream, Apply topically daily., Disp: , Rfl:    levothyroxine (SYNTHROID) 25 MCG tablet, Take 1 tablet (25 mcg total) by mouth daily before breakfast., Disp: 90 tablet, Rfl: 0   Liraglutide -Weight Management (SAXENDA) 18 MG/3ML SOPN, Inject 0.6 mg to 3 mg into the skin daily., Disp: 15 mL, Rfl: 0   omeprazole (PRILOSEC) 40 MG capsule, TAKE 1 CAPSULE (40 MG TOTAL) BY MOUTH DAILY., Disp: 90 capsule, Rfl: 1   PREMARIN vaginal cream, Place 1 Applicatorful vaginally 3 (three) times a week., Disp: 42.5 g, Rfl: 12   raloxifene (EVISTA) 60 MG tablet, Take 1 tablet (60 mg total) by mouth daily., Disp: 90 tablet, Rfl: 4   Semaglutide-Weight Management (WEGOVY) 0.25 MG/0.5ML SOAJ, Inject 0.25 mg into the skin once a week for 28 days., Disp: 2 mL, Rfl: 0   Semaglutide-Weight Management 1.7 MG/0.75ML SOAJ, Inject 1.7 mg into the skin once a week for 28 days., Disp: 3 mL, Rfl: 0   [START ON 03/18/2022] Semaglutide-Weight Management 2.4 MG/0.75ML SOAJ, Inject 2.4 mg into the skin once a week for 28 days., Disp: 3 mL, Rfl: 0   triamcinolone cream (KENALOG) 0.1 %, Apply 1 application topically 2 (two) times daily. Mix with lotion at home, Disp: 453.6 g, Rfl: 0   valACYclovir (VALTREX) 500 MG tablet, Take 1 tablet (500 mg total) by mouth 3 (three) times daily., Disp: 30 tablet, Rfl: 0   Vitamin D, Ergocalciferol, (DRISDOL) 1.25 MG (50000 UNIT) CAPS capsule, TAKE 1 CAPSULE (50,000 UNITS TOTAL) BY MOUTH EVERY 7 (SEVEN) DAYS, Disp: 12 capsule, Rfl: 3   lisdexamfetamine (VYVANSE) 50 MG capsule, Take 1  capsule (50 mg total) by mouth daily., Disp: 30 capsule, Rfl: 0   lisdexamfetamine (VYVANSE) 50 MG capsule, Take 1 capsule (50 mg total) by mouth daily., Disp: 30 capsule, Rfl: 0   lisdexamfetamine (VYVANSE) 50 MG capsule, Take 1 capsule (50 mg total) by mouth daily., Disp: 30 capsule, Rfl: 0  Allergies  Allergen Reactions   Topamax  [Topiramate]     severe depression   Trazodone     Other reaction(s): Dizziness    I personally reviewed active problem list, medication list, allergies, family history, social history, health maintenance with the patient/caregiver today.   ROS  Constitutional: Negative for fever or weight change.  Respiratory: Negative for cough and shortness of breath.   Cardiovascular: Negative for chest pain or palpitations.  Gastrointestinal: Negative for abdominal pain, no bowel changes.  Musculoskeletal: Negative for gait problem or joint swelling.  Skin: Negative for rash.  Neurological: Negative for dizziness or headache.  No other specific complaints in a complete review of systems (except as listed in HPI above).   Objective  Vitals:   02/23/22 1011  BP: 118/68  Pulse: 91  Resp: 16  SpO2: 96%  Weight: 208 lb (94.3 kg)  Height: 5\' 3"  (1.6 m)    Body mass index is 36.85 kg/m.  Physical Exam  Constitutional: Patient appears well-developed and well-nourished. Obese  No distress.  HEENT: head atraumatic, normocephalic, pupils equal and reactive to light, neck supple Cardiovascular: Normal rate, regular rhythm and normal heart sounds.  No murmur heard. No BLE edema. Pulmonary/Chest: Effort normal and breath sounds normal. No respiratory distress. Abdominal: Soft.  There is no tenderness. Psychiatric: Patient has a normal mood and affect. behavior is normal. Judgment and thought content normal.    PHQ2/9:    02/23/2022   10:10 AM 11/22/2021  9:41 AM 08/18/2021    9:34 AM 05/24/2021    9:41 AM 02/24/2021    1:08 PM  Depression screen PHQ 2/9   Decreased Interest 0 0 0 0 0  Down, Depressed, Hopeless 0 0 0 0 0  PHQ - 2 Score 0 0 0 0 0  Altered sleeping 3 0 0 0 0  Tired, decreased energy 3 0 0 1 0  Change in appetite 0 0 0 3 0  Feeling bad or failure about yourself  0 0 0 1 0  Trouble concentrating 0 0 0 0 0  Moving slowly or fidgety/restless 0 0 0 0 0  Suicidal thoughts 0 0 0 0 0  PHQ-9 Score 6 0 0 5 0  Difficult doing work/chores   Not difficult at all Not difficult at all Not difficult at all    phq 9 is negative   Fall Risk:    02/23/2022   10:10 AM 11/22/2021    9:41 AM 08/18/2021    9:34 AM 05/24/2021    9:32 AM 02/24/2021    1:08 PM  Fall Risk   Falls in the past year? 0 0 0 0 0  Number falls in past yr: 0  0  0  Injury with Fall? 0  0  0  Risk for fall due to : No Fall Risks No Fall Risks No Fall Risks No Fall Risks No Fall Risks  Follow up Falls prevention discussed Falls prevention discussed;Education provided;Falls evaluation completed Falls prevention discussed;Education provided Falls prevention discussed Falls prevention discussed      Functional Status Survey: Is the patient deaf or have difficulty hearing?: No Does the patient have difficulty seeing, even when wearing glasses/contacts?: No Does the patient have difficulty concentrating, remembering, or making decisions?: No Does the patient have difficulty walking or climbing stairs?: No Does the patient have difficulty dressing or bathing?: No Does the patient have difficulty doing errands alone such as visiting a doctor's office or shopping?: No    Assessment & Plan  1. Senile purpura ([place])  Reassurance given   2. Inflammatory spondylopathy of lumbar region ([place])  No recent flares, takes flexeril prn and occasionally meloxicam, explained prednisone is a better options since she had bariatric surgery   3. Major depression in remission [facility])  Not having energy due to being out of Vyvanse  4. Binge eating disorder  - lisdexamfetamine  (VYVANSE) 50 MG capsule; Take 1 capsule (50 mg total) by mouth daily.  Dispense: 90 capsule; Refill: 0  5. Spasm of muscle, back  Taking flexeril prn   6. Bariatric surgery status  Must stop taking NSAID's  7. Acquired hypothyroidism  - levothyroxine (SYNTHROID) 25 MCG tablet; Take 1 tablet (25 mcg total) by mouth daily before breakfast.  Dispense: 90 tablet; Refill: 0  8. GERD without esophagitis   9. PSVT (paroxysmal supraventricular tachycardia)  Doing well at this time  10. Vitamin D deficiency  Continue supplementation   11. Dyslipidemia   12. Osteopenia after menopause  - DG Bone Density; Future

## 2022-02-24 ENCOUNTER — Other Ambulatory Visit: Payer: Self-pay

## 2022-02-27 ENCOUNTER — Other Ambulatory Visit: Payer: Self-pay

## 2022-03-19 ENCOUNTER — Other Ambulatory Visit: Payer: Self-pay

## 2022-03-21 ENCOUNTER — Other Ambulatory Visit: Payer: Self-pay | Admitting: Family Medicine

## 2022-03-21 MED ORDER — SEMAGLUTIDE-WEIGHT MANAGEMENT 2.4 MG/0.75ML ~~LOC~~ SOAJ: 2.4000 mg | SUBCUTANEOUS | 0 refills | Status: AC

## 2022-04-04 ENCOUNTER — Other Ambulatory Visit: Payer: Self-pay

## 2022-04-18 ENCOUNTER — Other Ambulatory Visit: Payer: Self-pay

## 2022-05-16 ENCOUNTER — Other Ambulatory Visit: Payer: Self-pay | Admitting: Family Medicine

## 2022-05-16 NOTE — Telephone Encounter (Signed)
Requested medications are due for refill today.  unsure  Requested medications are on the active medications list.  no  Last refill. 03/22/2022  Future visit scheduled.   yes  Notes to clinic.  Med not on med list. Please review for refill.    Requested Prescriptions  Pending Prescriptions Disp Refills   WEGOVY 2.4 MG/0.75ML SOAJ [Pharmacy Med Name: WEGOVY 2.4 MG/0.75 ML PEN]      Sig: INJECT 2.4 MG INTO THE SKIN ONCE A WEEK FOR 28 DAYS.     Endocrinology:  Diabetes - GLP-1 Receptor Agonists - semaglutide Passed - 05/16/2022  5:04 PM      Passed - HBA1C in normal range and within 180 days    Hgb A1c MFr Bld  Date Value Ref Range Status  11/30/2021 5.4 <5.7 % of total Hgb Final    Comment:    For the purpose of screening for the presence of diabetes: . <5.7%       Consistent with the absence of diabetes 5.7-6.4%    Consistent with increased risk for diabetes             (prediabetes) > or =6.5%  Consistent with diabetes . This assay result is consistent with a decreased risk of diabetes. . Currently, no consensus exists regarding use of hemoglobin A1c for diagnosis of diabetes in children. . According to American Diabetes Association (ADA) guidelines, hemoglobin A1c <7.0% represents optimal control in non-pregnant diabetic patients. Different metrics may apply to specific patient populations.  Standards of Medical Care in Diabetes(ADA). .          Passed - Cr in normal range and within 360 days    Creat  Date Value Ref Range Status  11/30/2021 0.58 0.50 - 1.05 mg/dL Final         Passed - Valid encounter within last 6 months    Recent Outpatient Visits           2 months ago Senile purpura [facility])   [place] [facility] Alba Cory, MD   5 months ago Senile purpura [facility])   [place] [facility] Alba Cory, MD   9 months ago Major depression in remission [facility])   [place] [facility] Alba Cory,  MD   11 months ago PSVT (paroxysmal supraventricular tachycardia) [facility])   [place] [facility] Alba Cory, MD   1 year ago Binge eating disorder   [facility] Margarita Mail, DO       Future Appointments             In 1 week Alba Cory, MD [facility]

## 2022-05-18 ENCOUNTER — Encounter: Payer: Self-pay | Admitting: Family Medicine

## 2022-05-20 ENCOUNTER — Other Ambulatory Visit: Payer: Self-pay | Admitting: Family Medicine

## 2022-05-20 DIAGNOSIS — E039 Hypothyroidism, unspecified: Secondary | ICD-10-CM

## 2022-05-23 NOTE — Progress Notes (Unsigned)
Name: Terri Castillo   MRN: [id number]    DOB: 10-Feb-1950   Date:05/24/2022       Progress Note  Subjective  Chief Complaint  Follow Up  HPI  Depression Mild in Remission:  Terri Castillo has tried and failed a lot of medications including Prozac,  Duloxetine , Wellbutrin and Pristiq ( worked but too expensive) She has been on Lexapro for a while and is doing well. . She states since Vyvanse was started because of binge eating disorder 12/2016  it has helped her energy level. She states struggling since she has been out of Vyvanse due to [employer]. She still has not resumed medications we will send it to [facility] pharmacy today    History of bariatric surgery/binge Eating disorder : she had gastric bypass in 2010, her lowest weight was 180 lbs, but was gradually gained weight since, weight is stable in the 220's lbs range, she started [employer] on June 28 th, 2021 at a weight of 221 lbs and it went down to 175 lbs after that she reached a plateau and stopped doing the diet, she also was more stressed and resumed binge eating, weight went up to  197.1 lbs  in May 23   She asked about Saxenda since weight at home has been around 198 lbs ,we gave her a rx and she was able to take it for one month, she tolerated medication well and it suppressed her appetite, however ran out of medication due to nation wide shortage, we switched to [facility] and took for one month but there was a shortage and another gap, back on Saxenda 3 mg, but we switched her back to [facility] 2.4 mg dose in January, weight is down 5 lbs since last visit. 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 Taking Flexeril for muscle spasms every night and seems to help with symptoms, no radiculitis  Reminded her not to  take Meloxicam, but she still takes it prn - Ortho gave it to her. Unchanged   Heart rhythm change/PSVT : she noticed her iwatch picked up Afib back in September 2019 she saw Dr. Okey Dupre had a holder monitor but did not show afib . No recent episodes of palpitation , and denies decrease in exercise tolerance. Asymptomatic now   Hypothyroidism: last TSH was at goal again, continue current dose of levothyroxine, no change in bowel movements or dysphagia. Unchanged   Senile purpura: reassurance given . 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   B12 deficiency 07/02/2014   Cervical pain 07/02/2014   Arthralgia of lower leg 07/02/2014   Insomnia 07/02/2014   Depression, major, recurrent, mild (HCC) 07/02/2014   Dyslipidemia 07/02/2014   Dermatitis, eczematoid 07/02/2014   Genital herpes in women 07/02/2014   Gastric reflux 07/02/2014  Bariatric surgery status 07/02/2014   Herpes simplex 07/02/2014   H/O: pneumonia 07/02/2014   Eczema intertrigo 07/02/2014   Adult BMI 30+ 07/02/2014   OP (osteoporosis) 07/02/2014   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   Perennial allergic rhinitis with seasonal variation 07/02/2014   Basal cell papilloma 07/02/2014   Vitamin D deficiency 07/02/2014    Past Surgical History:  Procedure Laterality Date   ABDOMINAL HYSTERECTOMY  1994   CHOLECYSTECTOMY  2000   EYE SURGERY  2000   Cataract Removal   EYE SURGERY   2002   Cataract Removal   GASTRIC BYPASS  2000   Mini   GASTRIC BYPASS  2010    Family History  Problem Relation Age of Onset   Dementia Mother    COPD Mother    Osteoporosis Mother    Diabetes Father    Glaucoma Father    Parkinson's disease Father    Breast cancer Daughter    Brain cancer Daughter        Brain Tumor   Hypothyroidism Daughter    Cancer Daughter        breast   Heart disease Maternal Aunt    Heart disease Maternal Uncle    Glaucoma Brother    Heart Problems Brother    Heart attack Brother 31    Social History   Tobacco Use   Smoking status: Former    Packs/day: 0.50    Years: 5.00    Additional pack years: 0.00    Total pack years: 2.50    Types: Cigarettes    Start date: 01/23/1985    Quit date: 1992    Years since quitting: 32.3   Smokeless tobacco: Never  Substance Use Topics   Alcohol use: Not Currently    Comment: 3 drinks/year     Current Outpatient Medications:    Calcium Carb-Cholecalciferol (CALCIUM/VITAMIN D PO), Take by mouth daily., Disp: , Rfl:    cetirizine (ZYRTEC) 10 MG tablet, Take 10 mg by mouth daily as needed. , Disp: , Rfl:    Cyanocobalamin (B-12) 1000 MCG SUBL, Place 1 tablet under the tongue daily. Every other day, Disp: 45 tablet, Rfl: 1   cyclobenzaprine (FLEXERIL) 10 MG tablet, Take 1 tablet (10 mg total) by mouth at bedtime., Disp: 90 tablet, Rfl: 1   escitalopram (LEXAPRO) 20 MG tablet, TAKE 1 TABLET BY MOUTH EVERY DAY, Disp: 90 tablet, Rfl: 1   fluticasone (FLONASE) 50 MCG/ACT nasal spray, SPRAY 2 SPRAYS INTO EACH NOSTRIL EVERY DAY, Disp: 48 mL, Rfl: 1   Insulin Pen Needle 32G X 6 MM MISC, 1 each by Does not apply route daily at 12 noon., Disp: 100 each, Rfl: 1   ketoconazole (NIZORAL) 2 % cream, APPLY TO AFFECTED AREA TWICE A DAY FOR RASH, Disp: 90 g, Rfl: 0   ketoconazole (NIZORAL) 2 % cream, Apply topically daily., Disp: , Rfl:    levothyroxine (SYNTHROID) 25 MCG tablet, TAKE 1 TABLET BY MOUTH DAILY BEFORE  BREAKFAST., Disp: 90 tablet, Rfl: 0   lisdexamfetamine (VYVANSE) 50 MG capsule, Take 1 capsule (50 mg total) by mouth daily., Disp: 90 capsule, Rfl: 0   omeprazole (PRILOSEC) 40 MG capsule, TAKE 1 CAPSULE (40 MG TOTAL) BY MOUTH DAILY., Disp: 90 capsule, Rfl: 1   PREMARIN vaginal cream, Place 1 Applicatorful vaginally 3 (three) times a week., Disp: 42.5 g, Rfl: 12   raloxifene (EVISTA) 60 MG tablet, Take 1 tablet (60 mg total) by mouth daily., Disp: 90 tablet, Rfl: 4   triamcinolone cream (  KENALOG) 0.1 %, Apply 1 application topically 2 (two) times daily. Mix with lotion at home, Disp: 453.6 g, Rfl: 0   valACYclovir (VALTREX) 500 MG tablet, Take 1 tablet (500 mg total) by mouth 3 (three) times daily., Disp: 30 tablet, Rfl: 0   Vitamin D, Ergocalciferol, (DRISDOL) 1.25 MG (50000 UNIT) CAPS capsule, TAKE 1 CAPSULE (50,000 UNITS TOTAL) BY MOUTH EVERY 7 (SEVEN) DAYS, Disp: 12 capsule, Rfl: 3  Allergies  Allergen Reactions   Topamax  [Topiramate]     severe depression   Trazodone     Other reaction(s): Dizziness    I personally reviewed active problem list, medication list, allergies, family history, social history, health maintenance with the patient/caregiver today.   ROS  Ten systems reviewed and is negative except as mentioned in HPI   Objective  Vitals:   05/24/22 1026  BP: 118/66  Pulse: 90  Resp: 16  SpO2: 100%  Weight: 203 lb (92.1 kg)  Height: 5\' 3"  (1.6 m)    Body mass index is 35.96 kg/m.  Physical Exam  Constitutional: Patient appears well-developed and well-nourished. Obese  No distress.  HEENT: head atraumatic, normocephalic, pupils equal and reactive to light, neck supple Cardiovascular: Normal rate, regular rhythm and normal heart sounds.  No murmur heard. No BLE edema. Pulmonary/Chest: Effort normal and breath sounds normal. No respiratory distress. Abdominal: Soft.  There is no tenderness. Psychiatric: Patient has a normal mood and affect. behavior is normal.  Judgment and thought content normal.   PHQ2/9:    05/24/2022   10:25 AM 02/23/2022   10:10 AM 11/22/2021    9:41 AM 08/18/2021    9:34 AM 05/24/2021    9:41 AM  Depression screen PHQ 2/9  Decreased Interest 0 0 0 0 0  Down, Depressed, Hopeless 0 0 0 0 0  PHQ - 2 Score 0 0 0 0 0  Altered sleeping 0 3 0 0 0  Tired, decreased energy 0 3 0 0 1  Change in appetite 0 0 0 0 3  Feeling bad or failure about yourself  0 0 0 0 1  Trouble concentrating 0 0 0 0 0  Moving slowly or fidgety/restless 0 0 0 0 0  Suicidal thoughts 0 0 0 0 0  PHQ-9 Score 0 6 0 0 5  Difficult doing work/chores    Not difficult at all Not difficult at all    phq 9 is negative   Fall Risk:    05/24/2022   10:25 AM 02/23/2022   10:10 AM 11/22/2021    9:41 AM 08/18/2021    9:34 AM 05/24/2021    9:32 AM  Fall Risk   Falls in the past year? 0 0 0 0 0  Number falls in past yr: 0 0  0   Injury with Fall? 0 0  0   Risk for fall due to : No Fall Risks No Fall Risks No Fall Risks No Fall Risks No Fall Risks  Follow up Falls prevention discussed Falls prevention discussed Falls prevention discussed;Education provided;Falls evaluation completed Falls prevention discussed;Education provided Falls prevention discussed      Functional Status Survey: Is the patient deaf or have difficulty hearing?: No Does the patient have difficulty seeing, even when wearing glasses/contacts?: No Does the patient have difficulty concentrating, remembering, or making decisions?: No Does the patient have difficulty walking or climbing stairs?: No Does the patient have difficulty dressing or bathing?: No Does the patient have difficulty doing errands alone such as visiting  a doctor's office or shopping?: No    Assessment & Plan  1. Binge eating disorder  - lisdexamfetamine (VYVANSE) 50 MG capsule; Take 1 capsule (50 mg total) by mouth daily.  Dispense: 90 capsule; Refill: 0  2. Major depression in remission (HCC)  - escitalopram (LEXAPRO)  20 MG tablet; Take 1 tablet (20 mg total) by mouth daily.  Dispense: 90 tablet; Refill: 1  3. Vitamin D deficiency   4. Bariatric surgery status   5. Morbid obesity (HCC)  Discussed with the patient the risk posed by an increased BMI. Discussed importance of portion control, calorie counting and at least 150 minutes of physical activity weekly. Avoid sweet beverages and drink more water. Eat at least 6 servings of fruit and vegetables daily    6. Spasm of muscle, back  - cyclobenzaprine (FLEXERIL) 10 MG tablet; Take 1 tablet (10 mg total) by mouth at bedtime.  Dispense: 90 tablet; Refill: 1   7. Binge eating disorder  - lisdexamfetamine (VYVANSE) 50 MG capsule; Take 1 capsule (50 mg total) by mouth daily.  Dispense: 90 capsule; Refill: 0  8. Spasm of muscle, back  - cyclobenzaprine (FLEXERIL) 10 MG tablet; Take 1 tablet (10 mg total) by mouth at bedtime.  Dispense: 90 tablet; Refill: 1  9. GERD without esophagitis  Doing well   10. Osteoporosis without current pathological fracture, unspecified osteoporosis type   11. B12 deficiency

## 2022-05-24 ENCOUNTER — Other Ambulatory Visit: Payer: Self-pay

## 2022-05-24 ENCOUNTER — Encounter: Payer: Self-pay | Admitting: Family Medicine

## 2022-05-24 ENCOUNTER — Ambulatory Visit: Payer: Federal, State, Local not specified - PPO | Admitting: Family Medicine

## 2022-05-24 VITALS — BP 118/66 | HR 90 | Resp 16 | Ht 63.0 in | Wt 203.0 lb

## 2022-05-24 DIAGNOSIS — F325 Major depressive disorder, single episode, in full remission: Secondary | ICD-10-CM | POA: Diagnosis not present

## 2022-05-24 DIAGNOSIS — D692 Other nonthrombocytopenic purpura: Secondary | ICD-10-CM | POA: Diagnosis not present

## 2022-05-24 DIAGNOSIS — K219 Gastro-esophageal reflux disease without esophagitis: Secondary | ICD-10-CM

## 2022-05-24 DIAGNOSIS — F5081 Binge eating disorder: Secondary | ICD-10-CM

## 2022-05-24 DIAGNOSIS — M62830 Muscle spasm of back: Secondary | ICD-10-CM

## 2022-05-24 DIAGNOSIS — M810 Age-related osteoporosis without current pathological fracture: Secondary | ICD-10-CM

## 2022-05-24 DIAGNOSIS — M4696 Unspecified inflammatory spondylopathy, lumbar region: Secondary | ICD-10-CM | POA: Diagnosis not present

## 2022-05-24 DIAGNOSIS — Z9884 Bariatric surgery status: Secondary | ICD-10-CM

## 2022-05-24 DIAGNOSIS — E538 Deficiency of other specified B group vitamins: Secondary | ICD-10-CM

## 2022-05-24 DIAGNOSIS — E559 Vitamin D deficiency, unspecified: Secondary | ICD-10-CM

## 2022-05-24 MED ORDER — WEGOVY 2.4 MG/0.75ML ~~LOC~~ SOAJ
2.4000 mg | SUBCUTANEOUS | 1 refills | Status: DC
  Filled 2022-05-26: qty 3, 28d supply, fill #0
  Filled 2022-06-23: qty 3, 28d supply, fill #1
  Filled 2022-07-18: qty 3, 28d supply, fill #2
  Filled 2022-08-14: qty 3, 28d supply, fill #3

## 2022-05-24 MED ORDER — ESCITALOPRAM OXALATE 20 MG PO TABS: 20.0000 mg | ORAL_TABLET | Freq: Every day | ORAL | 1 refills | Status: DC

## 2022-05-24 MED ORDER — CYCLOBENZAPRINE HCL 10 MG PO TABS: 10.0000 mg | ORAL_TABLET | Freq: Every day | ORAL | 1 refills | Status: DC

## 2022-05-24 MED ORDER — LISDEXAMFETAMINE DIMESYLATE 50 MG PO CAPS
50.0000 mg | ORAL_CAPSULE | Freq: Every day | ORAL | 0 refills | Status: DC
Start: 2022-05-24 — End: 2022-08-25
  Filled 2022-05-24: qty 90, 90d supply, fill #0

## 2022-05-25 ENCOUNTER — Other Ambulatory Visit (HOSPITAL_COMMUNITY): Payer: Self-pay

## 2022-05-26 ENCOUNTER — Other Ambulatory Visit (HOSPITAL_COMMUNITY): Payer: Self-pay

## 2022-07-25 ENCOUNTER — Other Ambulatory Visit: Payer: Self-pay | Admitting: *Deleted

## 2022-07-25 ENCOUNTER — Telehealth: Payer: Self-pay

## 2022-07-25 ENCOUNTER — Telehealth: Payer: Self-pay | Admitting: *Deleted

## 2022-07-25 DIAGNOSIS — Z1211 Encounter for screening for malignant neoplasm of colon: Secondary | ICD-10-CM

## 2022-07-25 MED ORDER — NA SULFATE-K SULFATE-MG SULF 17.5-3.13-1.6 GM/177ML PO SOLN: 1.0000 | Freq: Once | ORAL | 0 refills | Status: AC

## 2022-07-25 NOTE — Telephone Encounter (Signed)
Colonoscopy schedule on 09/14/2022 with Dr Tobi Bastos

## 2022-07-25 NOTE — Telephone Encounter (Signed)
Patient called in to schedule their colonoscopy. You can call them anytime today, patient stated that she didn't know the number. She stated that she did receive the voicemail and she pushed it off and forgot about it, but she is ready now.

## 2022-07-25 NOTE — Telephone Encounter (Signed)
Gastroenterology Pre-Procedure Review  Request Date: 09/14/2022 Requesting Physician: Dr. Tobi Bastos  Patient is currently taking [facility] for weight management.   PATIENT REVIEW QUESTIONS: The patient responded to the following health history questions as indicated:    1. Are you having any GI issues? no 2. Do you have a personal history of Polyps? no 3. Do you have a family history of Colon Cancer or Polyps? yes (father had polyps) 4. Diabetes Mellitus? no 5. Joint replacements in the past 12 months?no 6. Major health problems in the past 3 months?no 7. Any artificial heart valves, MVP, or defibrillator?no    MEDICATIONS & ALLERGIES:    Patient reports the following regarding taking any anticoagulation/antiplatelet therapy:   Plavix, Coumadin, Eliquis, Xarelto, Lovenox, Pradaxa, Brilinta, or Effient? no Aspirin? no  Patient confirms/reports the following medications:  Current Outpatient Medications  Medication Sig Dispense Refill   Calcium Carb-Cholecalciferol (CALCIUM/VITAMIN D PO) Take by mouth daily.     cetirizine (ZYRTEC) 10 MG tablet Take 10 mg by mouth daily as needed.      Cyanocobalamin (B-12) 1000 MCG SUBL Place 1 tablet under the tongue daily. Every other day 45 tablet 1   cyclobenzaprine (FLEXERIL) 10 MG tablet Take 1 tablet (10 mg total) by mouth at bedtime. 90 tablet 1   escitalopram (LEXAPRO) 20 MG tablet Take 1 tablet (20 mg total) by mouth daily. 90 tablet 1   fluticasone (FLONASE) 50 MCG/ACT nasal spray SPRAY 2 SPRAYS INTO EACH NOSTRIL EVERY DAY 48 mL 1   Insulin Pen Needle 32G X 6 MM MISC 1 each by Does not apply route daily at 12 noon. 100 each 1   ketoconazole (NIZORAL) 2 % cream APPLY TO AFFECTED AREA TWICE A DAY FOR RASH 90 g 0   ketoconazole (NIZORAL) 2 % cream Apply topically daily.     levothyroxine (SYNTHROID) 25 MCG tablet TAKE 1 TABLET BY MOUTH DAILY BEFORE BREAKFAST. 90 tablet 0   lisdexamfetamine (VYVANSE) 50 MG capsule Take 1 capsule (50 mg total) by mouth  daily. 90 capsule 0   omeprazole (PRILOSEC) 40 MG capsule TAKE 1 CAPSULE (40 MG TOTAL) BY MOUTH DAILY. 90 capsule 1   PREMARIN vaginal cream Place 1 Applicatorful vaginally 3 (three) times a week. 42.5 g 12   raloxifene (EVISTA) 60 MG tablet Take 1 tablet (60 mg total) by mouth daily. 90 tablet 4   Semaglutide-Weight Management (WEGOVY) 2.4 MG/0.75ML SOAJ Inject 2.4 mg into the skin once a week. 9 mL 1   triamcinolone cream (KENALOG) 0.1 % Apply 1 application topically 2 (two) times daily. Mix with lotion at home 453.6 g 0   valACYclovir (VALTREX) 500 MG tablet Take 1 tablet (500 mg total) by mouth 3 (three) times daily. 30 tablet 0   Vitamin D, Ergocalciferol, (DRISDOL) 1.25 MG (50000 UNIT) CAPS capsule TAKE 1 CAPSULE (50,000 UNITS TOTAL) BY MOUTH EVERY 7 (SEVEN) DAYS 12 capsule 3   No current facility-administered medications for this visit.    Patient confirms/reports the following allergies:  Allergies  Allergen Reactions   Topamax  [Topiramate]     severe depression   Trazodone     Other reaction(s): Dizziness    No orders of the defined types were placed in this encounter.   AUTHORIZATION INFORMATION Primary Insurance: 1D#: Group #:  Secondary Insurance: 1D#: Group #:  SCHEDULE INFORMATION: Date: 09/14/2022 Time: Location:  [facility]

## 2022-07-31 ENCOUNTER — Ambulatory Visit
Admission: RE | Admit: 2022-07-31 | Discharge: 2022-07-31 | Disposition: A | Discharge status: Home / Self Care | Payer: Federal, State, Local not specified - PPO | Source: Ambulatory Visit | Attending: Family Medicine | Admitting: Family Medicine

## 2022-07-31 DIAGNOSIS — M8580 Other specified disorders of bone density and structure, unspecified site: Secondary | ICD-10-CM | POA: Diagnosis present

## 2022-07-31 DIAGNOSIS — Z780 Asymptomatic menopausal state: Secondary | ICD-10-CM | POA: Insufficient documentation

## 2022-08-18 ENCOUNTER — Other Ambulatory Visit: Payer: Self-pay | Admitting: Family Medicine

## 2022-08-18 DIAGNOSIS — E039 Hypothyroidism, unspecified: Secondary | ICD-10-CM

## 2022-08-23 ENCOUNTER — Encounter: Payer: Self-pay | Admitting: Family Medicine

## 2022-08-23 NOTE — Progress Notes (Signed)
Name: Terri Castillo   MRN: [id number]    DOB: [birth year]   Date:08/25/2022       Progress Note  Subjective  Chief Complaint  Follow up  HPI  Depression Mild in Remission:  Terri Castillo has tried and failed a lot of medications including Prozac,  Duloxetine , Wellbutrin and Pristiq ( worked but too expensive) She has been on Lexapro for a while and is doing well. Vyvanse was started because of binge eating disorder 12/2016  it has helped her energy level. Doing well    History of bariatric surgery/binge Eating disorder : she had gastric bypass in 2010, her lowest weight was 180 lbs, but was gradually gained weight since, weight is stable in the 220's lbs range, she started [employer] on June 28 th, 2021 at a weight of 221 lbs and it went down to 175 lbs after that she reached a plateau and stopped doing the diet, she also was more stressed and resumed binge eating, weight went up to  197.1 lbs  in May 23   She asked about Saxenda since weight at home has been around 198 lbs ,we gave her a rx and she was able to take it for one month, she tolerated medication well and it suppressed her appetite, however ran out of medication due to nation wide shortage, we switched to [facility] and took for one month but there was a shortage and another gap, back on Saxenda 3 mg, but we switched her back to [facility] 2.4 mg dose in January,  weight is down to 195.7 lbs She also takes Vyvanse for binge eating disorder. 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 Taking Flexeril for muscle spasms every night and symptoms are stable    Heart rhythm change/PSVT : she noticed her iwatch picked up Afib back in September 2019 she saw Dr.  Okey Dupre had a holder monitor but did not show afib . No recent episodes of palpitation , and denies decrease in exercise tolerance. She is doing well   Hypothyroidism: last TSH was at goal again, continue current dose of levothyroxine, no change in bowel movements or dysphagia. We will recheck labs today   Senile purpura: reassurance given . Unchanged    Patient Active Problem List   Diagnosis Date Noted   Osteopenia after menopause 02/23/2022   PSVT (paroxysmal supraventricular tachycardia) 05/03/2018   PVC (premature ventricular contraction) 05/03/2018   PAC (premature atrial contraction) 05/03/2018   Palpitations 02/04/2018   Inflammatory spondylopathy of lumbar region (HCC) 05/22/2017   Senile purpura (HCC) 08/11/2016   Facet arthropathy, lumbar 02/02/2016   Spondylisthesis 01/12/2016   Chronic bilateral low back pain 11/01/2015   Iron deficiency anemia 09/04/2014   Perioral dermatitis 09/04/2014   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   B12 deficiency 07/02/2014   Cervical pain 07/02/2014   Arthralgia of lower leg 07/02/2014   Insomnia 07/02/2014   Depression, major, recurrent, mild (HCC) 07/02/2014   Dyslipidemia 07/02/2014   Dermatitis, eczematoid 07/02/2014   Genital herpes in women 07/02/2014   Gastric reflux 07/02/2014   Bariatric surgery status 07/02/2014   Herpes simplex 07/02/2014   H/O: pneumonia 07/02/2014   Eczema intertrigo 07/02/2014   Adult BMI 30+  07/02/2014   OP (osteoporosis) 07/02/2014   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   Perennial allergic rhinitis with seasonal variation 07/02/2014   Basal cell papilloma 07/02/2014   Vitamin D deficiency 07/02/2014    Past Surgical History:  Procedure Laterality Date   ABDOMINAL HYSTERECTOMY  1994   CHOLECYSTECTOMY  2000   EYE SURGERY  2000   Cataract Removal   EYE SURGERY  2002   Cataract Removal   GASTRIC BYPASS  2000   Mini   GASTRIC BYPASS  2010    Family History  Problem Relation Age of Onset   Dementia Mother     COPD Mother    Osteoporosis Mother    Diabetes Father    Glaucoma Father    Parkinson's disease Father    Breast cancer Daughter    Brain cancer Daughter        Brain Tumor   Hypothyroidism Daughter    Cancer Daughter        breast   Heart disease Maternal Aunt    Heart disease Maternal Uncle    Glaucoma Brother    Heart Problems Brother    Heart attack Brother 77    Social History   Tobacco Use   Smoking status: Former    Current packs/day: 0.00    Average packs/day: 0.5 packs/day for 5.0 years (2.5 ttl pk-yrs)    Types: Cigarettes    Start date: 01/23/1985    Quit date: 1992    Years since quitting: 32.6   Smokeless tobacco: Never  Substance Use Topics   Alcohol use: Not Currently    Comment: 3 drinks/year     Current Outpatient Medications:    Calcium Carb-Cholecalciferol (CALCIUM/VITAMIN D PO), Take by mouth daily., Disp: , Rfl:    cetirizine (ZYRTEC) 10 MG tablet, Take 10 mg by mouth daily as needed. , Disp: , Rfl:    Cyanocobalamin (B-12) 1000 MCG SUBL, Place 1 tablet under the tongue daily. Every other day, Disp: 45 tablet, Rfl: 1   escitalopram (LEXAPRO) 20 MG tablet, Take 1 tablet (20 mg total) by mouth daily., Disp: 90 tablet, Rfl: 1   fluticasone (FLONASE) 50 MCG/ACT nasal spray, SPRAY 2 SPRAYS INTO EACH NOSTRIL EVERY DAY, Disp: 48 mL, Rfl: 1   Insulin Pen Needle 32G X 6 MM MISC, 1 each by Does not apply route daily at 12 noon., Disp: 100 each, Rfl: 1   ketoconazole (NIZORAL) 2 % cream, APPLY TO AFFECTED AREA TWICE A DAY FOR RASH, Disp: 90 g, Rfl: 0   levothyroxine (SYNTHROID) 25 MCG tablet, TAKE 1 TABLET BY MOUTH DAILY BEFORE BREAKFAST., Disp: 90 tablet, Rfl: 0   lisdexamfetamine (VYVANSE) 50 MG capsule, Take 1 capsule (50 mg total) by mouth daily., Disp: 90 capsule, Rfl: 0   omeprazole (PRILOSEC) 40 MG capsule, TAKE 1 CAPSULE (40 MG TOTAL) BY MOUTH DAILY., Disp: 90 capsule, Rfl: 1   PREMARIN vaginal cream, Place 1 Applicatorful vaginally 3 (three) times a  week., Disp: 42.5 g, Rfl: 12   raloxifene (EVISTA) 60 MG tablet, Take 1 tablet (60 mg total) by mouth daily., Disp: 90 tablet, Rfl: 4   Semaglutide-Weight Management (WEGOVY) 2.4 MG/0.75ML SOAJ, Inject 2.4 mg into the skin once a week., Disp: 9 mL, Rfl: 1   triamcinolone cream (KENALOG) 0.1 %, Apply 1 application topically 2 (two) times daily. Mix with lotion at home, Disp: 453.6 g, Rfl: 0   valACYclovir (VALTREX) 500 MG tablet, Take 1 tablet (500 mg total) by mouth 3 (three) times daily., Disp:  30 tablet, Rfl: 0   Vitamin D, Ergocalciferol, (DRISDOL) 1.25 MG (50000 UNIT) CAPS capsule, TAKE 1 CAPSULE (50,000 UNITS TOTAL) BY MOUTH EVERY 7 (SEVEN) DAYS, Disp: 12 capsule, Rfl: 3   cyclobenzaprine (FLEXERIL) 10 MG tablet, Take 1 tablet (10 mg total) by mouth at bedtime. (Patient not taking: Reported on 08/25/2022), Disp: 90 tablet, Rfl: 1   ketoconazole (NIZORAL) 2 % cream, Apply topically daily. (Patient not taking: Reported on 08/25/2022), Disp: , Rfl:   Allergies  Allergen Reactions   Topamax  [Topiramate]     severe depression   Trazodone     Other reaction(s): Dizziness    I personally reviewed active problem list, medication list, allergies, family history, social history with the patient/caregiver today.   ROS  Constitutional: Negative for fever , positive for weight change.  Respiratory: Negative for cough and shortness of breath.   Cardiovascular: Negative for chest pain or palpitations.  Gastrointestinal: Negative for abdominal pain, no bowel changes.  Musculoskeletal: Negative for gait problem or joint swelling.  Skin: Negative for rash.  Neurological: Negative for dizziness or headache.  No other specific complaints in a complete review of systems (except as listed in HPI above).   Objective  Vitals:   08/25/22 0749  BP: 116/72  Pulse: 84  Resp: 16  Temp: 98.1 F (36.7 C)  TempSrc: Oral  SpO2: 98%  Weight: 195 lb 11.2 oz (88.8 kg)  Height: 5\' 3"  (1.6 m)    Body mass  index is 34.67 kg/m.  Physical Exam  Constitutional: Patient appears well-developed and well-nourished. No distress.  HEENT: head atraumatic, normocephalic, pupils equal and reactive to light, neck supple, throat within normal limits Cardiovascular: Normal rate, regular rhythm and normal heart sounds.  No murmur heard. No BLE edema. Pulmonary/Chest: Effort normal and breath sounds normal. No respiratory distress. Abdominal: Soft.  There is no tenderness. Psychiatric: Patient has a normal mood and affect. behavior is normal. Judgment and thought content normal.     PHQ2/9:    05/24/2022   10:25 AM 02/23/2022   10:10 AM 11/22/2021    9:41 AM 08/18/2021    9:34 AM 05/24/2021    9:41 AM  Depression screen PHQ 2/9  Decreased Interest 0 0 0 0 0  Down, Depressed, Hopeless 0 0 0 0 0  PHQ - 2 Score 0 0 0 0 0  Altered sleeping 0 3 0 0 0  Tired, decreased energy 0 3 0 0 1  Change in appetite 0 0 0 0 3  Feeling bad or failure about yourself  0 0 0 0 1  Trouble concentrating 0 0 0 0 0  Moving slowly or fidgety/restless 0 0 0 0 0  Suicidal thoughts 0 0 0 0 0  PHQ-9 Score 0 6 0 0 5  Difficult doing work/chores    Not difficult at all Not difficult at all    phq 9 is negative   Fall Risk:    05/24/2022   10:25 AM 02/23/2022   10:10 AM 11/22/2021    9:41 AM 08/18/2021    9:34 AM 05/24/2021    9:32 AM  Fall Risk   Falls in the past year? 0 0 0 0 0  Number falls in past yr: 0 0  0   Injury with Fall? 0 0  0   Risk for fall due to : No Fall Risks No Fall Risks No Fall Risks No Fall Risks No Fall Risks  Follow up Falls prevention discussed Falls prevention  discussed Falls prevention discussed;Education provided;Falls evaluation completed Falls prevention discussed;Education provided Falls prevention discussed     Assessment & Plan   1. Major depression in remission (HCC)  Lexapro is working   2. Senile purpura (HCC)  Reassurance given   3. Obesity (BMI 30.0-34.9)  Continue Wegovy,  weight is down to below 200 lbs  4. Vitamin D deficiency  - Vitamin D, Ergocalciferol, (DRISDOL) 1.25 MG (50000 UNIT) CAPS capsule; Take 1 capsule (50,000 Units total) by mouth every 14 (fourteen) days.  Dispense: 6 capsule; Refill: 1  5. Acquired hypothyroidism  - levothyroxine (SYNTHROID) 25 MCG tablet; Take 1 tablet (25 mcg total) by mouth daily before breakfast.  Dispense: 90 tablet; Refill: 0  6. B12 deficiency  - cyanocobalamin (VITAMIN B12) injection 1,000 mcg  7. Osteopenia after menopause  Continue Evista   8. Binge eating disorder  - lisdexamfetamine (VYVANSE) 50 MG capsule; Take 1 capsule (50 mg total) by mouth daily.  Dispense: 90 capsule; Refill: 0  9. Bariatric surgery status   10. GERD without esophagitis  - omeprazole (PRILOSEC) 40 MG capsule; Take 1 capsule (40 mg total) by mouth daily.  Dispense: 90 capsule; Refill: 1

## 2022-08-24 ENCOUNTER — Ambulatory Visit: Payer: Federal, State, Local not specified - PPO | Admitting: Family Medicine

## 2022-08-25 ENCOUNTER — Ambulatory Visit: Payer: Federal, State, Local not specified - PPO | Admitting: Family Medicine

## 2022-08-25 ENCOUNTER — Encounter: Payer: Self-pay | Admitting: Family Medicine

## 2022-08-25 VITALS — BP 116/72 | HR 84 | Temp 98.1°F | Resp 16 | Ht 63.0 in | Wt 195.7 lb

## 2022-08-25 DIAGNOSIS — F5081 Binge eating disorder: Secondary | ICD-10-CM

## 2022-08-25 DIAGNOSIS — F325 Major depressive disorder, single episode, in full remission: Secondary | ICD-10-CM | POA: Diagnosis not present

## 2022-08-25 DIAGNOSIS — Z1211 Encounter for screening for malignant neoplasm of colon: Secondary | ICD-10-CM

## 2022-08-25 DIAGNOSIS — M8580 Other specified disorders of bone density and structure, unspecified site: Secondary | ICD-10-CM

## 2022-08-25 DIAGNOSIS — D692 Other nonthrombocytopenic purpura: Secondary | ICD-10-CM | POA: Diagnosis not present

## 2022-08-25 DIAGNOSIS — Z780 Asymptomatic menopausal state: Secondary | ICD-10-CM

## 2022-08-25 DIAGNOSIS — E559 Vitamin D deficiency, unspecified: Secondary | ICD-10-CM | POA: Diagnosis not present

## 2022-08-25 DIAGNOSIS — E039 Hypothyroidism, unspecified: Secondary | ICD-10-CM

## 2022-08-25 DIAGNOSIS — E538 Deficiency of other specified B group vitamins: Secondary | ICD-10-CM

## 2022-08-25 DIAGNOSIS — E669 Obesity, unspecified: Secondary | ICD-10-CM | POA: Diagnosis not present

## 2022-08-25 DIAGNOSIS — Z9884 Bariatric surgery status: Secondary | ICD-10-CM

## 2022-08-25 DIAGNOSIS — K219 Gastro-esophageal reflux disease without esophagitis: Secondary | ICD-10-CM

## 2022-08-25 MED ORDER — OMEPRAZOLE 40 MG PO CPDR
40.0000 mg | DELAYED_RELEASE_CAPSULE | Freq: Every day | ORAL | 1 refills | Status: DC
Start: 2022-08-25 — End: 2023-02-27

## 2022-08-25 MED ORDER — CYANOCOBALAMIN 1000 MCG/ML IJ SOLN
1000.0000 ug | Freq: Once | INTRAMUSCULAR | Status: AC
Start: 2022-08-25 — End: 2022-08-25
  Administered 2022-08-25: 1000 ug via INTRAMUSCULAR

## 2022-08-25 MED ORDER — WEGOVY 2.4 MG/0.75ML ~~LOC~~ SOAJ
2.4000 mg | SUBCUTANEOUS | 1 refills | Status: DC
  Filled 2022-09-20: qty 3, 28d supply, fill #0
  Filled 2022-10-14 – 2022-10-18 (×2): qty 3, 28d supply, fill #1
  Filled 2022-11-14: qty 3, 28d supply, fill #2

## 2022-08-25 MED ORDER — LEVOTHYROXINE SODIUM 25 MCG PO TABS
25.0000 ug | ORAL_TABLET | Freq: Every day | ORAL | 0 refills | Status: DC
Start: 2022-08-25 — End: 2023-02-09

## 2022-08-25 MED ORDER — LISDEXAMFETAMINE DIMESYLATE 50 MG PO CAPS: 50.0000 mg | ORAL_CAPSULE | Freq: Every day | ORAL | 0 refills | Status: DC

## 2022-08-25 MED ORDER — VITAMIN D (ERGOCALCIFEROL) 1.25 MG (50000 UNIT) PO CAPS
50000.0000 [IU] | ORAL_CAPSULE | ORAL | 1 refills | Status: DC
Start: 2022-08-25 — End: 2023-02-27

## 2022-09-01 ENCOUNTER — Other Ambulatory Visit: Payer: Self-pay

## 2022-09-01 ENCOUNTER — Encounter: Payer: Self-pay | Admitting: Family Medicine

## 2022-09-01 DIAGNOSIS — F5081 Binge eating disorder: Secondary | ICD-10-CM

## 2022-09-01 MED ORDER — LISDEXAMFETAMINE DIMESYLATE 50 MG PO CAPS
50.0000 mg | ORAL_CAPSULE | Freq: Every day | ORAL | 0 refills | Status: DC
  Filled 2022-09-01: qty 30, 30d supply, fill #0

## 2022-09-05 ENCOUNTER — Other Ambulatory Visit: Payer: Self-pay

## 2022-09-07 ENCOUNTER — Encounter: Payer: Self-pay | Admitting: Gastroenterology

## 2022-09-07 ENCOUNTER — Other Ambulatory Visit: Payer: Self-pay

## 2022-09-13 ENCOUNTER — Encounter: Payer: Self-pay | Admitting: Gastroenterology

## 2022-09-14 ENCOUNTER — Other Ambulatory Visit: Payer: Self-pay

## 2022-09-14 ENCOUNTER — Encounter
Admission: RE | Disposition: A | Discharge status: Home / Self Care | Payer: Self-pay | Source: Home / Self Care | Attending: Gastroenterology

## 2022-09-14 ENCOUNTER — Encounter: Payer: Self-pay | Admitting: Gastroenterology

## 2022-09-14 ENCOUNTER — Ambulatory Visit: Payer: Federal, State, Local not specified - PPO | Admitting: Anesthesiology

## 2022-09-14 ENCOUNTER — Ambulatory Visit
Admission: RE | Admit: 2022-09-14 | Discharge: 2022-09-14 | Disposition: A | Discharge status: Home / Self Care | Payer: Federal, State, Local not specified - PPO | Attending: Gastroenterology | Admitting: Gastroenterology

## 2022-09-14 DIAGNOSIS — K219 Gastro-esophageal reflux disease without esophagitis: Secondary | ICD-10-CM | POA: Insufficient documentation

## 2022-09-14 DIAGNOSIS — Z1211 Encounter for screening for malignant neoplasm of colon: Secondary | ICD-10-CM

## 2022-09-14 DIAGNOSIS — Z87891 Personal history of nicotine dependence: Secondary | ICD-10-CM | POA: Insufficient documentation

## 2022-09-14 DIAGNOSIS — E039 Hypothyroidism, unspecified: Secondary | ICD-10-CM | POA: Diagnosis not present

## 2022-09-14 DIAGNOSIS — F32A Depression, unspecified: Secondary | ICD-10-CM | POA: Insufficient documentation

## 2022-09-14 HISTORY — PX: COLONOSCOPY WITH PROPOFOL: SHX5780

## 2022-09-14 SURGERY — COLONOSCOPY WITH PROPOFOL
Anesthesia: General

## 2022-09-14 MED ORDER — PROPOFOL 500 MG/50ML IV EMUL
INTRAVENOUS | Status: DC | PRN
  Administered 2022-09-14: 125 ug/kg/min via INTRAVENOUS

## 2022-09-14 MED ORDER — SODIUM CHLORIDE 0.9 % IV SOLN: INTRAVENOUS | Status: DC

## 2022-09-14 MED ORDER — LIDOCAINE HCL (PF) 2 % IJ SOLN
INTRAMUSCULAR | Status: AC
  Filled 2022-09-14: qty 5

## 2022-09-14 MED ORDER — PROPOFOL 10 MG/ML IV BOLUS
INTRAVENOUS | Status: DC | PRN
Start: 2022-09-14 — End: 2022-09-14
  Administered 2022-09-14: 20 mg via INTRAVENOUS
  Administered 2022-09-14: 60 mg via INTRAVENOUS

## 2022-09-14 MED ORDER — PROPOFOL 1000 MG/100ML IV EMUL
INTRAVENOUS | Status: AC
  Filled 2022-09-14: qty 100

## 2022-09-14 NOTE — Transfer of Care (Signed)
Immediate Anesthesia Transfer of Care Note  Patient: EULA KELSALL  Procedure(s) Performed: COLONOSCOPY WITH PROPOFOL  Patient Location: PACU  Anesthesia Type:General  Level of Consciousness: unresponsive  Airway & Oxygen Therapy: Patient Spontanous Breathing  Post-op Assessment: Report given to RN and Post -op Vital signs reviewed and stable  Post vital signs: Reviewed and stable  Last Vitals:  Vitals Value Taken Time  BP 98/56 09/14/22 0844  Temp 36.3 C 09/14/22 0844  Pulse 68 09/14/22 0846  Resp 15 09/14/22 0846  SpO2 97 % 09/14/22 0846  Vitals shown include unfiled device data.  Last Pain:  Vitals:   09/14/22 0844  TempSrc: Temporal  PainSc: Asleep         Complications: No notable events documented.

## 2022-09-14 NOTE — Anesthesia Preprocedure Evaluation (Signed)
Anesthesia Evaluation  Patient identified by MRN, date of birth, ID band Patient awake    Reviewed: Allergy & Precautions, H&P , NPO status , Patient's Chart, lab work & pertinent test results, reviewed documented beta blocker date and time   History of Anesthesia Complications Negative for: history of anesthetic complications  Airway Mallampati: I  TM Distance: >3 FB Neck ROM: full    Dental  (+) Caps, Dental Advidsory Given, Teeth Intact, Missing   Pulmonary neg pulmonary ROS, Continuous Positive Airway Pressure Ventilation , former smoker   Pulmonary exam normal breath sounds clear to auscultation       Cardiovascular Exercise Tolerance: Good negative cardio ROS Normal cardiovascular exam Rhythm:regular Rate:Normal     Neuro/Psych  PSYCHIATRIC DISORDERS  Depression    negative neurological ROS     GI/Hepatic Neg liver ROS,GERD  Medicated and Controlled,,  Endo/Other  neg diabetesHypothyroidism    Renal/GU negative Renal ROS  negative genitourinary   Musculoskeletal   Abdominal   Peds  Hematology negative hematology ROS (+)   Anesthesia Other Findings Past Medical History: No date: Chronic back pain No date: Depression No date: Insomnia No date: Obesity   Reproductive/Obstetrics negative OB ROS                             Anesthesia Physical Anesthesia Plan  ASA: 2  Anesthesia Plan: General   Post-op Pain Management:    Induction: Intravenous  PONV Risk Score and Plan: 3 and Propofol infusion and TIVA  Airway Management Planned: Natural Airway and Nasal Cannula  Additional Equipment:   Intra-op Plan:   Post-operative Plan:   Informed Consent: I have reviewed the patients History and Physical, chart, labs and discussed the procedure including the risks, benefits and alternatives for the proposed anesthesia with the patient or authorized representative who has indicated  his/her understanding and acceptance.     Dental Advisory Given  Plan Discussed with: Anesthesiologist, CRNA and Surgeon  Anesthesia Plan Comments:         Anesthesia Quick Evaluation

## 2022-09-14 NOTE — H&P (Signed)
Wyline Mood, MD [address], [address], [place], [place], [address] [address], [address], [place], [place], [address] Phone: [phone]  Fax: [phone]  Primary Care Physician:  Alba Cory, MD   Pre-Procedure History & Physical: HPI:  Terri Castillo is a 72 y.o. female is here for an colonoscopy.   Past Medical History:  Diagnosis Date   Chronic back pain    Depression    Insomnia    Obesity     Past Surgical History:  Procedure Laterality Date   ABDOMINAL HYSTERECTOMY  1994   CHOLECYSTECTOMY  2000   COLONOSCOPY     EYE SURGERY  2000   Cataract Removal   EYE SURGERY  2002   Cataract Removal   GASTRIC BYPASS  2000   Mini   GASTRIC BYPASS  2010    Prior to Admission medications   Medication Sig Start Date End Date Taking? Authorizing Provider  Calcium Carb-Cholecalciferol (CALCIUM/VITAMIN D PO) Take by mouth daily.    [provider]  cetirizine (ZYRTEC) 10 MG tablet Take 10 mg by mouth daily as needed.     [provider]  Cyanocobalamin (B-12) 1000 MCG SUBL Place 1 tablet under the tongue daily. Every other day 03/11/20   Alba Cory, MD  cyclobenzaprine (FLEXERIL) 10 MG tablet Take 1 tablet (10 mg total) by mouth at bedtime. Patient not taking: Reported on 08/25/2022 05/24/22   Alba Cory, MD  escitalopram (LEXAPRO) 20 MG tablet Take 1 tablet (20 mg total) by mouth daily. 05/24/22   Alba Cory, MD  fluticasone (FLONASE) 50 MCG/ACT nasal spray SPRAY 2 SPRAYS INTO EACH NOSTRIL EVERY DAY 05/24/21   Alba Cory, MD  Insulin Pen Needle 32G X 6 MM MISC 1 each by Does not apply route daily at 12 noon. Patient not taking: Reported on 09/14/2022 01/04/22   Alba Cory, MD  ketoconazole (NIZORAL) 2 % cream APPLY TO AFFECTED AREA TWICE A DAY FOR RASH 10/02/21   Alba Cory, MD  levothyroxine (SYNTHROID) 25 MCG tablet Take 1 tablet (25 mcg total) by mouth daily before breakfast. 08/25/22   Carlynn Purl, Danna Hefty, MD  lisdexamfetamine  (VYVANSE) 50 MG capsule Take 1 capsule (50 mg total) by mouth daily. 09/01/22   Alba Cory, MD  omeprazole (PRILOSEC) 40 MG capsule Take 1 capsule (40 mg total) by mouth daily. 08/25/22   Alba Cory, MD  PREMARIN vaginal cream Place 1 Applicatorful vaginally 3 (three) times a week. 08/18/19   Alba Cory, MD  raloxifene (EVISTA) 60 MG tablet Take 1 tablet (60 mg total) by mouth daily. 11/22/21   Alba Cory, MD  Semaglutide-Weight Management (WEGOVY) 2.4 MG/0.75ML SOAJ Inject 2.4 mg into the skin once a week. 08/25/22   Alba Cory, MD  triamcinolone cream (KENALOG) 0.1 % Apply 1 application topically 2 (two) times daily. Mix with lotion at home 06/03/19   Alba Cory, MD  valACYclovir (VALTREX) 500 MG tablet Take 1 tablet (500 mg total) by mouth 3 (three) times daily. 05/24/21   Alba Cory, MD  Vitamin D, Ergocalciferol, (DRISDOL) 1.25 MG (50000 UNIT) CAPS capsule Take 1 capsule (50,000 Units total) by mouth every 14 (fourteen) days. 08/25/22   Alba Cory, MD    Allergies as of 07/25/2022 - Review Complete 05/24/2022  Allergen Reaction Noted   Topamax  [topiramate]  07/02/2014   Trazodone  07/01/2014    Family History  Problem Relation Age of Onset   Dementia Mother    COPD Mother  Osteoporosis Mother    Diabetes Father    Glaucoma Father    Parkinson's disease Father    Breast cancer Daughter    Brain cancer Daughter        Brain Tumor   Hypothyroidism Daughter    Cancer Daughter        breast   Heart disease Maternal Aunt    Heart disease Maternal Uncle    Glaucoma Brother    Heart Problems Brother    Heart attack Brother 11    Social History   Socioeconomic History   Marital status: Divorced    Spouse name: Not on file   Number of children: 2   Years of education: Not on file   Highest education level: Some college, no degree  Occupational History   Not on file  Tobacco Use   Smoking status: Former    Current packs/day: 0.00    Average  packs/day: 0.5 packs/day for 5.0 years (2.5 ttl pk-yrs)    Types: Cigarettes    Start date: 01/23/1985    Quit date: 1992    Years since quitting: 32.6   Smokeless tobacco: Never  Vaping Use   Vaping status: Never Used  Substance and Sexual Activity   Alcohol use: Not Currently    Comment: 3 drinks/year   Drug use: No   Sexual activity: Not Currently    Partners: Male  Other Topics Concern   Not on file  Social History Narrative   Not on file   Social Determinants of Health   Financial Resource Strain: Low Risk  (05/20/2022)   Overall Financial Resource Strain (CARDIA)    Difficulty of Paying Living Expenses: Not hard at all  Food Insecurity: No Food Insecurity (05/20/2022)   Hunger Vital Sign    Worried About Running Out of Food in the Last Year: Never true    Ran Out of Food in the Last Year: Never true  Transportation Needs: No Transportation Needs (05/20/2022)   PRAPARE - Administrator, Civil Service (Medical): No    Lack of Transportation (Non-Medical): No  Physical Activity: Unknown (05/20/2022)   Exercise Vital Sign    Days of Exercise per Week: 0 days    Minutes of Exercise per Session: Not on file  Stress: No Stress Concern Present (05/20/2022)   [employer] of Occupational Health - Occupational Stress Questionnaire    Feeling of Stress : Not at all  Social Connections: Unknown (05/20/2022)   Social Connection and Isolation Panel [NHANES]    Frequency of Communication with Friends and Family: More than three times a week    Frequency of Social Gatherings with Friends and Family: Twice a week    Attends Religious Services: Patient declined    Database administrator or Organizations: No    Attends Engineer, structural: Not on file    Marital Status: Divorced  Intimate Partner Violence: Not At Risk (11/18/2020)   Humiliation, Afraid, Rape, and Kick questionnaire    Fear of Current or Ex-Partner: No    Emotionally Abused: No    Physically  Abused: No    Sexually Abused: No    Review of Systems: See HPI, otherwise negative ROS  Physical Exam: BP 125/81   Pulse 73   Temp (!) 97.4 F (36.3 C) (Temporal)   Resp 16   Ht 5\' 3"  (1.6 m)   Wt 87.1 kg   SpO2 100%   BMI 34.01 kg/m  General:   Alert,  pleasant and cooperative in NAD Head:  Normocephalic and atraumatic. Neck:  Supple; no masses or thyromegaly. Lungs:  Clear throughout to auscultation, normal respiratory effort.    Heart:  +S1, +S2, Regular rate and rhythm, No edema. Abdomen:  Soft, nontender and nondistended. Normal bowel sounds, without guarding, and without rebound.   Neurologic:  Alert and  oriented x4;  grossly normal neurologically.  Impression/Plan: KANAKO PENT is here for an colonoscopy to be performed for Screening colonoscopy average risk   Risks, benefits, limitations, and alternatives regarding  colonoscopy have been reviewed with the patient.  Questions have been answered.  All parties agreeable.   Wyline Mood, MD  09/14/2022, 7:46 AM

## 2022-09-14 NOTE — Op Note (Signed)
[facility] Patient Name: Terri Castillo Procedure Date: 09/14/2022 8:13 AM MRN: [id number] Account #: [id number] Date of Birth: 10-12-1950 Admit Type: Outpatient Age: 72 Room: [facility] ENDO ROOM 3 Gender: Female Note Status: Finalized Instrument Name: Prentice Docker [id number] Procedure:             Colonoscopy Indications:           Screening for colorectal malignant neoplasm Providers:             Wyline Mood MD, MD Referring MD:          Onnie Boer. Sowles, MD (Referring MD) Medicines:             Monitored Anesthesia Care Complications:         No immediate complications. Procedure:             Pre-Anesthesia Assessment:                        - Prior to the procedure, a History and Physical was                         performed, and patient medications, allergies and                         sensitivities were reviewed. The patient's tolerance                         of previous anesthesia was reviewed.                        - The risks and benefits of the procedure and the                         sedation options and risks were discussed with the                         patient. All questions were answered and informed                         consent was obtained.                        - ASA Grade Assessment: II - A patient with mild                         systemic disease.                        After obtaining informed consent, the colonoscope was                         passed under direct vision. Throughout the procedure,                         the patient's blood pressure, pulse, and oxygen                         saturations were monitored continuously. The                         Colonoscope was  introduced through the anus and                         advanced to the the cecum, identified by the                         appendiceal orifice. The colonoscopy was performed                         with ease. The patient tolerated the procedure  well.                         The quality of the bowel preparation was good. The                         ileocecal valve, appendiceal orifice, and rectum were                         photographed. Findings:      The perianal and digital rectal examinations were normal.      The colon (entire examined portion) appeared normal.      The exam was otherwise without abnormality on direct and retroflexion       views. Impression:            - The entire examined colon is normal.                        - The examination was otherwise normal on direct and                         retroflexion views.                        - No specimens collected. Recommendation:        - Discharge patient to home (with escort).                        - Resume previous diet.                        - Continue present medications.                        - Repeat colonoscopy is not recommended due to current                         age (53 years or older) for screening purposes. Procedure Code(s):     --- Professional ---                        [phone], Colonoscopy, flexible; diagnostic, including                         collection of specimen(s) by brushing or washing, when                         performed (separate procedure) Diagnosis Code(s):     --- Professional ---  Z12.11, Encounter for screening for malignant neoplasm                         of colon CPT copyright 2022 American [employer] Association. All rights reserved. The codes documented in this report are preliminary and upon coder review may  be revised to meet current compliance requirements. Wyline Mood, MD Wyline Mood MD, MD 09/14/2022 8:43:17 AM This report has been signed electronically. Number of Addenda: 0 Note Initiated On: 09/14/2022 8:13 AM Scope Withdrawal Time: 0 hours 13 minutes 15 seconds  Total Procedure Duration: 0 hours 17 minutes 5 seconds  Estimated Blood Loss:  Estimated blood loss: none.      [facility]

## 2022-09-15 ENCOUNTER — Encounter: Payer: Self-pay | Admitting: Gastroenterology

## 2022-09-20 ENCOUNTER — Other Ambulatory Visit (HOSPITAL_COMMUNITY): Payer: Self-pay

## 2022-09-20 NOTE — Anesthesia Postprocedure Evaluation (Signed)
Anesthesia Post Note  Patient: Terri Castillo  Procedure(s) Performed: COLONOSCOPY WITH PROPOFOL  Patient location during evaluation: Endoscopy Anesthesia Type: General Level of consciousness: awake and alert Pain management: pain level controlled Vital Signs Assessment: post-procedure vital signs reviewed and stable Respiratory status: spontaneous breathing, nonlabored ventilation, respiratory function stable and patient connected to nasal cannula oxygen Cardiovascular status: blood pressure returned to baseline and stable Postop Assessment: no apparent nausea or vomiting Anesthetic complications: no   No notable events documented.   Last Vitals:  Vitals:   09/14/22 0854 09/14/22 0904  BP: 110/68   Pulse: 68   Resp: 14   Temp:    SpO2: 97% 98%    Last Pain:  Vitals:   09/15/22 0728  TempSrc:   PainSc: 0-No pain                 Lenard Simmer

## 2022-09-21 ENCOUNTER — Other Ambulatory Visit (HOSPITAL_COMMUNITY): Payer: Self-pay

## 2022-10-06 ENCOUNTER — Other Ambulatory Visit: Payer: Self-pay

## 2022-10-06 ENCOUNTER — Other Ambulatory Visit: Payer: Self-pay | Admitting: Family Medicine

## 2022-10-06 DIAGNOSIS — F5081 Binge eating disorder: Secondary | ICD-10-CM

## 2022-10-06 MED ORDER — LISDEXAMFETAMINE DIMESYLATE 50 MG PO CAPS
50.0000 mg | ORAL_CAPSULE | Freq: Every day | ORAL | 0 refills | Status: DC
Start: 2022-10-06 — End: 2022-11-27
  Filled 2022-10-06: qty 30, 30d supply, fill #0

## 2022-10-16 ENCOUNTER — Other Ambulatory Visit: Payer: Self-pay

## 2022-10-18 ENCOUNTER — Other Ambulatory Visit: Payer: Self-pay

## 2022-10-19 ENCOUNTER — Other Ambulatory Visit (HOSPITAL_COMMUNITY): Payer: Self-pay

## 2022-11-14 ENCOUNTER — Other Ambulatory Visit: Payer: Self-pay | Admitting: Family Medicine

## 2022-11-14 DIAGNOSIS — Z1231 Encounter for screening mammogram for malignant neoplasm of breast: Secondary | ICD-10-CM

## 2022-11-20 ENCOUNTER — Encounter: Payer: Self-pay | Admitting: Family Medicine

## 2022-11-20 ENCOUNTER — Other Ambulatory Visit (HOSPITAL_COMMUNITY): Payer: Self-pay

## 2022-11-21 ENCOUNTER — Other Ambulatory Visit: Payer: Self-pay | Admitting: Family Medicine

## 2022-11-21 DIAGNOSIS — R739 Hyperglycemia, unspecified: Secondary | ICD-10-CM

## 2022-11-21 DIAGNOSIS — E785 Hyperlipidemia, unspecified: Secondary | ICD-10-CM

## 2022-11-21 DIAGNOSIS — E538 Deficiency of other specified B group vitamins: Secondary | ICD-10-CM

## 2022-11-21 DIAGNOSIS — E559 Vitamin D deficiency, unspecified: Secondary | ICD-10-CM

## 2022-11-21 DIAGNOSIS — Z79899 Other long term (current) drug therapy: Secondary | ICD-10-CM

## 2022-11-21 DIAGNOSIS — E039 Hypothyroidism, unspecified: Secondary | ICD-10-CM

## 2022-11-22 ENCOUNTER — Other Ambulatory Visit: Payer: Self-pay | Admitting: Family Medicine

## 2022-11-22 DIAGNOSIS — F325 Major depressive disorder, single episode, in full remission: Secondary | ICD-10-CM

## 2022-11-24 LAB — HEMOGLOBIN A1C
Hgb A1c MFr Bld: 5.4 %{Hb} (ref <5.7)
Mean Plasma Glucose: 108 mg/dL
eAG (mmol/L): 6 mmol/L

## 2022-11-24 LAB — CBC WITH DIFFERENTIAL/PLATELET
Absolute Lymphocytes: 1562 {cells}/uL (ref 850–3900)
Absolute Monocytes: 442 {cells}/uL (ref 200–950)
Basophils Absolute: 51 {cells}/uL (ref 0–200)
Basophils Relative: 0.8 %
Eosinophils Absolute: 109 {cells}/uL (ref 15–500)
Eosinophils Relative: 1.7 %
HCT: 45.6 % — ABNORMAL HIGH (ref 35.0–45.0)
Hemoglobin: 14.5 g/dL (ref 11.7–15.5)
MCH: 30.1 pg (ref 27.0–33.0)
MCHC: 31.8 g/dL — ABNORMAL LOW (ref 32.0–36.0)
MCV: 94.6 fL (ref 80.0–100.0)
MPV: 9.8 fL (ref 7.5–12.5)
Monocytes Relative: 6.9 %
Neutro Abs: 4237 {cells}/uL (ref 1500–7800)
Neutrophils Relative %: 66.2 %
Platelets: 423 10*3/uL — ABNORMAL HIGH (ref 140–400)
RBC: 4.82 10*6/uL (ref 3.80–5.10)
RDW: 12 % (ref 11.0–15.0)
Total Lymphocyte: 24.4 %
WBC: 6.4 10*3/uL (ref 3.8–10.8)

## 2022-11-24 LAB — COMPLETE METABOLIC PANEL WITH GFR
AG Ratio: 1.3 (calc) (ref 1.0–2.5)
ALT: 14 U/L (ref 6–29)
AST: 18 U/L (ref 10–35)
Albumin: 3.9 g/dL (ref 3.6–5.1)
Alkaline phosphatase (APISO): 113 U/L (ref 37–153)
BUN: 15 mg/dL (ref 7–25)
CO2: 27 mmol/L (ref 20–32)
Calcium: 9 mg/dL (ref 8.6–10.4)
Chloride: 104 mmol/L (ref 98–110)
Creat: 0.75 mg/dL (ref 0.60–1.00)
Globulin: 3.1 g/dL (ref 1.9–3.7)
Glucose, Bld: 87 mg/dL (ref 65–99)
Potassium: 5.1 mmol/L (ref 3.5–5.3)
Sodium: 140 mmol/L (ref 135–146)
Total Bilirubin: 0.6 mg/dL (ref 0.2–1.2)
Total Protein: 7 g/dL (ref 6.1–8.1)
eGFR: 86 mL/min/{1.73_m2} (ref >=60)

## 2022-11-24 LAB — LIPID PANEL
Cholesterol: 186 mg/dL (ref <200)
HDL: 46 mg/dL — ABNORMAL LOW (ref >=50)
LDL Cholesterol (Calc): 107 mg/dL — ABNORMAL HIGH
Non-HDL Cholesterol (Calc): 140 mg/dL — ABNORMAL HIGH (ref <130)
Total CHOL/HDL Ratio: 4 (calc) (ref <5.0)
Triglycerides: 211 mg/dL — ABNORMAL HIGH (ref <150)

## 2022-11-24 LAB — VITAMIN D 25 HYDROXY (VIT D DEFICIENCY, FRACTURES): Vit D, 25-Hydroxy: 34 ng/mL (ref 30–100)

## 2022-11-24 LAB — TSH: TSH: 3.34 m[IU]/L (ref 0.40–4.50)

## 2022-11-24 LAB — B12 AND FOLATE PANEL
Folate: 18.8 ng/mL
Vitamin B-12: 542 pg/mL (ref 200–1100)

## 2022-11-24 NOTE — Progress Notes (Unsigned)
Name: Terri Castillo   MRN: [id number]    DOB: 03-02-1950   Date:11/27/2022       Progress Note  Subjective  Chief Complaint  Follow Up  I connected with  [name]  on 11/27/22 at 10:40 AM EST by a video enabled telemedicine application and verified that I am speaking with the correct person using two identifiers.  I discussed the limitations of evaluation and management by telemedicine and the availability of in person appointments. The patient expressed understanding and agreed to proceed with the virtual visit  Staff also discussed with the patient that there may be a patient responsible charge related to this service. Patient Location: inside her daughter's car - passanger  Provider Location: [facility] Additional Individuals present: Joni Reining - daughter   HPI  Depression Mild in Remission:  Patriece has tried and failed a lot of medications including Prozac,  Duloxetine , Wellbutrin and Pristiq ( worked but too expensive) She has been on Lexapro for a while and is doing well. Vyvanse was started because of binge eating disorder 12/2016  it has helped her energy level but she has been out of medication the past 2 weeks. She used to get 90 days supply but due to [place] shortage she has been getting 30 days supply. Advised her to contact me 5 days before she runs out of medication. Continue medications    History of bariatric surgery/binge Eating disorder : she had gastric bypass in 2010, her lowest weight was 180 lbs, but was gradually gained weight since, weight is stable in the 220's lbs range, she started [employer] on June 28 th, 2021 at a weight of 221 lbs and it went down to 175 lbs after that she reached a plateau and stopped doing the diet, she also was more stressed and resumed binge eating, weight went up to  197.1 lbs  in May 23   She asked about Saxenda since weight at home has been around 198 lbs ,we gave her a rx and she was able to take it for one month, she tolerated  medication well and it suppressed her appetite, however ran out of medication due to [place] wide shortage, we switched to [facility] and took for one month but there was a shortage and another gap, back on Saxenda 3 mg, but we switched her back to [facility] 2.4 mg dose in January,  weight is down to 192 lbs based on her schedule. Virtual visit today . 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 Taking Flexeril for muscle spasms every night and symptoms are stable  Pain on her back is 6/10 on average   Heart rhythm change/PSVT : she noticed her iwatch picked up Afib back in September 2019 she saw Dr. Okey Dupre had a holder monitor but did not show afib . No recent episodes of palpitation , and denies decrease in exercise tolerance. No symptoms lately   Hypothyroidism: last TSH was at goal again, continue current dose of levothyroxine, no change in bowel movements or dysphagia. Unchanged   Senile purpura: reassurance given, stable   Osteopenia: she has a history of osteoporosis , treated in the past, last bone  density showed osteopenia   Patient Active Problem List   Diagnosis Date Noted   Screening for colon cancer 09/14/2022   Osteopenia after menopause 02/23/2022   PSVT (paroxysmal supraventricular tachycardia) (HCC) 05/03/2018   PVC (premature ventricular contraction) 05/03/2018   PAC (premature atrial contraction) 05/03/2018   Palpitations 02/04/2018   Inflammatory spondylopathy of lumbar region (HCC) 05/22/2017   Senile purpura (HCC) 08/11/2016   Facet arthropathy, lumbar 02/02/2016   Spondylolisthesis 01/12/2016   Chronic bilateral low back pain 11/01/2015   Iron deficiency anemia 09/04/2014   Perioral dermatitis  09/04/2014   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   B12 deficiency 07/02/2014   Cervical pain 07/02/2014   Arthralgia of lower leg 07/02/2014   Insomnia 07/02/2014   Depression, major, recurrent, mild (HCC) 07/02/2014   Dyslipidemia 07/02/2014   Dermatitis, eczematoid 07/02/2014   Genital herpes in women 07/02/2014   Gastric reflux 07/02/2014   Bariatric surgery status 07/02/2014   Herpes simplex 07/02/2014   H/O: pneumonia 07/02/2014   Eczema intertrigo 07/02/2014   Adult BMI 30+ 07/02/2014   OP (osteoporosis) 07/02/2014   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   Perennial allergic rhinitis with seasonal variation 07/02/2014   Basal cell papilloma 07/02/2014   Vitamin D deficiency 07/02/2014    Past Surgical History:  Procedure Laterality Date   ABDOMINAL HYSTERECTOMY  1994   CHOLECYSTECTOMY  2000   COLONOSCOPY     COLONOSCOPY WITH PROPOFOL N/A 09/14/2022   Procedure: COLONOSCOPY WITH PROPOFOL;  Surgeon: Wyline Mood, MD;  Location: [facility] ENDOSCOPY;  Service: Gastroenterology;  Laterality: N/A;   EYE SURGERY  2000   Cataract Removal   EYE SURGERY  2002   Cataract Removal   GASTRIC BYPASS  2000   Mini   GASTRIC BYPASS  2010    Family History  Problem Relation Age of Onset   Dementia Mother    COPD Mother    Osteoporosis Mother    Diabetes Father    Glaucoma Father    Parkinson's disease Father    Breast cancer Daughter    Brain cancer Daughter        Brain Tumor   Hypothyroidism Daughter    Cancer Daughter        breast   Heart disease Maternal Aunt    Heart disease Maternal Uncle    Glaucoma Brother    Heart Problems Brother    Heart attack Brother 46    Social History   Socioeconomic History   Marital status: Divorced    Spouse name: Not on file   Number of children: 2   Years of education: Not on file   Highest education level: Some college, no degree  Occupational History   Not on file  Tobacco Use   Smoking status: Former    Current packs/day: 0.00    Average  packs/day: 0.5 packs/day for 5.0 years (2.5 ttl pk-yrs)    Types: Cigarettes    Start date: 01/23/1985    Quit date: 1992    Years since quitting: 32.8   Smokeless tobacco: Never  Vaping Use   Vaping status: Never Used  Substance and Sexual Activity   Alcohol use: Not Currently    Comment: 3 drinks/year   Drug use: No   Sexual activity: Not Currently    Partners: Male  Other Topics Concern   Not on file  Social History Narrative   Not on file   Social Determinants of Health   Financial Resource Strain: Low Risk  (11/27/2022)   Overall Physicist, medical Strain (  CARDIA)    Difficulty of Paying Living Expenses: Not hard at all  Food Insecurity: No Food Insecurity (11/27/2022)   Hunger Vital Sign    Worried About Running Out of Food in the Last Year: Never true    Ran Out of Food in the Last Year: Never true  Transportation Needs: No Transportation Needs (11/27/2022)   PRAPARE - Administrator, Civil Service (Medical): No    Lack of Transportation (Non-Medical): No  Physical Activity: Unknown (11/27/2022)   Exercise Vital Sign    Days of Exercise per Week: 0 days    Minutes of Exercise per Session: Not on file  Stress: Patient Declined (11/27/2022)   [employer] of Occupational Health - Occupational Stress Questionnaire    Feeling of Stress : Patient declined  Social Connections: Unknown (11/27/2022)   Social Connection and Isolation Panel [NHANES]    Frequency of Communication with Friends and Family: More than three times a week    Frequency of Social Gatherings with Friends and Family: Once a week    Attends Religious Services: Patient declined    Database administrator or Organizations: No    Attends Engineer, structural: Not on file    Marital Status: Divorced  Intimate Partner Violence: Not At Risk (11/18/2020)   Humiliation, Afraid, Rape, and Kick questionnaire    Fear of Current or Ex-Partner: No    Emotionally Abused: No    Physically Abused:  No    Sexually Abused: No     Current Outpatient Medications:    Calcium Carb-Cholecalciferol (CALCIUM/VITAMIN D PO), Take by mouth daily., Disp: , Rfl:    cetirizine (ZYRTEC) 10 MG tablet, Take 10 mg by mouth daily as needed. , Disp: , Rfl:    Cyanocobalamin (B-12) 1000 MCG SUBL, Place 1 tablet under the tongue daily. Every other day, Disp: 45 tablet, Rfl: 1   escitalopram (LEXAPRO) 20 MG tablet, TAKE 1 TABLET BY MOUTH EVERY DAY, Disp: 90 tablet, Rfl: 1   fluticasone (FLONASE) 50 MCG/ACT nasal spray, SPRAY 2 SPRAYS INTO EACH NOSTRIL EVERY DAY, Disp: 48 mL, Rfl: 1   ketoconazole (NIZORAL) 2 % cream, APPLY TO AFFECTED AREA TWICE A DAY FOR RASH, Disp: 90 g, Rfl: 0   levothyroxine (SYNTHROID) 25 MCG tablet, Take 1 tablet (25 mcg total) by mouth daily before breakfast., Disp: 90 tablet, Rfl: 0   lisdexamfetamine (VYVANSE) 50 MG capsule, Take 1 capsule (50 mg total) by mouth daily., Disp: 30 capsule, Rfl: 0   omeprazole (PRILOSEC) 40 MG capsule, Take 1 capsule (40 mg total) by mouth daily., Disp: 90 capsule, Rfl: 1   PREMARIN vaginal cream, Place 1 Applicatorful vaginally 3 (three) times a week., Disp: 42.5 g, Rfl: 12   raloxifene (EVISTA) 60 MG tablet, Take 1 tablet (60 mg total) by mouth daily., Disp: 90 tablet, Rfl: 4   Semaglutide-Weight Management (WEGOVY) 2.4 MG/0.75ML SOAJ, Inject 2.4 mg into the skin once a week., Disp: 9 mL, Rfl: 1   triamcinolone cream (KENALOG) 0.1 %, Apply 1 application topically 2 (two) times daily. Mix with lotion at home, Disp: 453.6 g, Rfl: 0   valACYclovir (VALTREX) 500 MG tablet, Take 1 tablet (500 mg total) by mouth 3 (three) times daily., Disp: 30 tablet, Rfl: 0   Vitamin D, Ergocalciferol, (DRISDOL) 1.25 MG (50000 UNIT) CAPS capsule, Take 1 capsule (50,000 Units total) by mouth every 14 (fourteen) days., Disp: 6 capsule, Rfl: 1   cyclobenzaprine (FLEXERIL) 10 MG tablet, Take 1 tablet (  10 mg total) by mouth at bedtime. (Patient not taking: Reported on 08/25/2022),  Disp: 90 tablet, Rfl: 1   Insulin Pen Needle 32G X 6 MM MISC, 1 each by Does not apply route daily at 12 noon. (Patient not taking: Reported on 09/14/2022), Disp: 100 each, Rfl: 1  Allergies  Allergen Reactions   Topamax  [Topiramate]     severe depression   Trazodone     Other reaction(s): Dizziness    I personally reviewed active problem list, medication list, allergies, family history, social history, health maintenance with the patient/caregiver today.   ROS  Ten systems reviewed and is negative except as mentioned in HPI    Objective  Virtual encounter, vitals not obtained.  There is no height or weight on file to calculate BMI.  Physical Exam  Awake, alert and oriented   PHQ2/9:    11/27/2022   11:05 AM 08/25/2022    8:24 AM 05/24/2022   10:25 AM 02/23/2022   10:10 AM 11/22/2021    9:41 AM  Depression screen PHQ 2/9  Decreased Interest 0 0 0 0 0  Down, Depressed, Hopeless 0 0 0 0 0  PHQ - 2 Score 0 0 0 0 0  Altered sleeping 0 0 0 3 0  Tired, decreased energy 0 0 0 3 0  Change in appetite 0 0 0 0 0  Feeling bad or failure about yourself  0 0 0 0 0  Trouble concentrating 0 0 0 0 0  Moving slowly or fidgety/restless 0 0 0 0 0  Suicidal thoughts 0 0 0 0 0  PHQ-9 Score 0 0 0 6 0   PHQ-2/9 Result is negative.    Fall Risk:    11/27/2022   11:05 AM 08/25/2022    8:24 AM 05/24/2022   10:25 AM 02/23/2022   10:10 AM 11/22/2021    9:41 AM  Fall Risk   Falls in the past year? 0  0 0 0  Number falls in past yr: 0  0 0   Injury with Fall? 0  0 0   Risk for fall due to : No Fall Risks No Fall Risks No Fall Risks No Fall Risks No Fall Risks  Follow up Falls prevention discussed Falls prevention discussed Falls prevention discussed Falls prevention discussed Falls prevention discussed;Education provided;Falls evaluation completed     Assessment & Plan  1. Binge eating disorder  - lisdexamfetamine (VYVANSE) 50 MG capsule; Take 1 capsule (50 mg total) by mouth daily.   Dispense: 90 capsule; Refill: 0  2. Major depression in remission (HCC)  Continue  medications   3. Senile purpura (HCC)  Reassurance given   4. Osteopenia after menopause  On vitamin D supplementation and high calcium diet  5. Inflammatory spondylopathy of lumbar region (HCC)  Stable, takes muscle relaxer prn   6. Obesity (BMI 30.0-34.9)  - Semaglutide-Weight Management (WEGOVY) 2.4 MG/0.75ML SOAJ; Inject 2.4 mg into the skin once a week.  Dispense: 9 mL; Refill: 0   7. Spasm of muscle, back  - cyclobenzaprine (FLEXERIL) 10 MG tablet; Take 1 tablet (10 mg total) by mouth at bedtime.  Dispense: 90 tablet; Refill: 1   8. Acquired hypothyroidism   Continue current dose of levothyroxine   I discussed the assessment and treatment plan with the patient. The patient was provided an opportunity to ask questions and all were answered. The patient agreed with the plan and demonstrated an understanding of the instructions.  The patient was advised to  call back or seek an in-person evaluation if the symptoms worsen or if the condition fails to improve as anticipated.  I provided 25  minutes of non-face-to-face time during this encounter.

## 2022-11-26 ENCOUNTER — Encounter: Payer: Self-pay | Admitting: Family Medicine

## 2022-11-27 ENCOUNTER — Other Ambulatory Visit: Payer: Self-pay

## 2022-11-27 ENCOUNTER — Telehealth: Payer: Federal, State, Local not specified - PPO | Admitting: Family Medicine

## 2022-11-27 DIAGNOSIS — Z780 Asymptomatic menopausal state: Secondary | ICD-10-CM

## 2022-11-27 DIAGNOSIS — E66811 Obesity, class 1: Secondary | ICD-10-CM

## 2022-11-27 DIAGNOSIS — F5081 Binge eating disorder, mild: Secondary | ICD-10-CM

## 2022-11-27 DIAGNOSIS — D692 Other nonthrombocytopenic purpura: Secondary | ICD-10-CM

## 2022-11-27 DIAGNOSIS — F325 Major depressive disorder, single episode, in full remission: Secondary | ICD-10-CM | POA: Diagnosis not present

## 2022-11-27 DIAGNOSIS — M8580 Other specified disorders of bone density and structure, unspecified site: Secondary | ICD-10-CM

## 2022-11-27 DIAGNOSIS — E669 Obesity, unspecified: Secondary | ICD-10-CM

## 2022-11-27 DIAGNOSIS — E039 Hypothyroidism, unspecified: Secondary | ICD-10-CM

## 2022-11-27 DIAGNOSIS — M62830 Muscle spasm of back: Secondary | ICD-10-CM

## 2022-11-27 DIAGNOSIS — M4696 Unspecified inflammatory spondylopathy, lumbar region: Secondary | ICD-10-CM

## 2022-11-27 MED ORDER — CYCLOBENZAPRINE HCL 10 MG PO TABS
10.0000 mg | ORAL_TABLET | Freq: Every day | ORAL | 1 refills | Status: DC
Start: 2022-11-27 — End: 2023-05-28

## 2022-11-27 MED ORDER — WEGOVY 2.4 MG/0.75ML ~~LOC~~ SOAJ
2.4000 mg | SUBCUTANEOUS | 0 refills | Status: DC
  Filled 2022-11-27: qty 9, 84d supply, fill #0
  Filled 2022-12-12: qty 3, 28d supply, fill #0
  Filled 2023-01-10: qty 3, 28d supply, fill #1

## 2022-11-27 MED ORDER — LISDEXAMFETAMINE DIMESYLATE 50 MG PO CAPS
50.0000 mg | ORAL_CAPSULE | Freq: Every day | ORAL | 0 refills | Status: DC
Start: 2022-11-27 — End: 2023-02-27
  Filled 2022-11-27: qty 90, 90d supply, fill #0

## 2022-12-04 ENCOUNTER — Encounter: Payer: Self-pay | Admitting: Family Medicine

## 2022-12-12 ENCOUNTER — Other Ambulatory Visit (HOSPITAL_COMMUNITY): Payer: Self-pay

## 2022-12-13 ENCOUNTER — Other Ambulatory Visit (HOSPITAL_COMMUNITY): Payer: Self-pay

## 2022-12-14 ENCOUNTER — Ambulatory Visit
Admission: RE | Admit: 2022-12-14 | Discharge: 2022-12-14 | Disposition: A | Discharge status: Home / Self Care | Payer: Federal, State, Local not specified - PPO | Source: Ambulatory Visit | Attending: Family Medicine | Admitting: Family Medicine

## 2022-12-14 DIAGNOSIS — Z1231 Encounter for screening mammogram for malignant neoplasm of breast: Secondary | ICD-10-CM | POA: Diagnosis present

## 2022-12-29 ENCOUNTER — Encounter: Payer: Self-pay | Admitting: Family Medicine

## 2022-12-29 DIAGNOSIS — M810 Age-related osteoporosis without current pathological fracture: Secondary | ICD-10-CM

## 2022-12-30 ENCOUNTER — Other Ambulatory Visit: Payer: Self-pay | Admitting: Family Medicine

## 2022-12-30 DIAGNOSIS — M810 Age-related osteoporosis without current pathological fracture: Secondary | ICD-10-CM

## 2023-01-05 ENCOUNTER — Other Ambulatory Visit: Payer: Self-pay | Admitting: Family Medicine

## 2023-01-05 DIAGNOSIS — A6009 Herpesviral infection of other urogenital tract: Secondary | ICD-10-CM

## 2023-01-05 DIAGNOSIS — B001 Herpesviral vesicular dermatitis: Secondary | ICD-10-CM

## 2023-01-08 MED ORDER — VALACYCLOVIR HCL 500 MG PO TABS: 500.0000 mg | ORAL_TABLET | Freq: Three times a day (TID) | ORAL | 0 refills | Status: DC

## 2023-01-10 ENCOUNTER — Other Ambulatory Visit: Payer: Self-pay | Admitting: Medical Genetics

## 2023-01-10 ENCOUNTER — Other Ambulatory Visit (HOSPITAL_COMMUNITY): Payer: Self-pay

## 2023-01-10 ENCOUNTER — Other Ambulatory Visit: Payer: Self-pay

## 2023-02-09 ENCOUNTER — Other Ambulatory Visit: Payer: Self-pay | Admitting: Family Medicine

## 2023-02-09 DIAGNOSIS — E039 Hypothyroidism, unspecified: Secondary | ICD-10-CM

## 2023-02-19 ENCOUNTER — Other Ambulatory Visit: Payer: Self-pay | Admitting: Family Medicine

## 2023-02-19 DIAGNOSIS — E559 Vitamin D deficiency, unspecified: Secondary | ICD-10-CM

## 2023-02-20 ENCOUNTER — Other Ambulatory Visit
Admission: RE | Admit: 2023-02-20 | Discharge: 2023-02-20 | Disposition: A | Discharge status: Home / Self Care | Payer: Self-pay | Source: Ambulatory Visit | Attending: Oncology | Admitting: Oncology

## 2023-02-25 ENCOUNTER — Other Ambulatory Visit: Payer: Self-pay | Admitting: Family Medicine

## 2023-02-25 DIAGNOSIS — K219 Gastro-esophageal reflux disease without esophagitis: Secondary | ICD-10-CM

## 2023-02-26 NOTE — Telephone Encounter (Signed)
 Appt made

## 2023-02-26 NOTE — Telephone Encounter (Signed)
VV on 11/27/22

## 2023-02-27 ENCOUNTER — Encounter: Payer: Self-pay | Admitting: Family Medicine

## 2023-02-27 ENCOUNTER — Other Ambulatory Visit: Payer: Self-pay

## 2023-02-27 ENCOUNTER — Ambulatory Visit (INDEPENDENT_AMBULATORY_CARE_PROVIDER_SITE_OTHER): Payer: Medicare Other | Admitting: Family Medicine

## 2023-02-27 VITALS — BP 116/76 | HR 95 | Temp 97.7°F | Resp 16 | Ht 63.0 in | Wt 202.2 lb

## 2023-02-27 DIAGNOSIS — F325 Major depressive disorder, single episode, in full remission: Secondary | ICD-10-CM

## 2023-02-27 DIAGNOSIS — E039 Hypothyroidism, unspecified: Secondary | ICD-10-CM

## 2023-02-27 DIAGNOSIS — I471 Supraventricular tachycardia, unspecified: Secondary | ICD-10-CM | POA: Diagnosis not present

## 2023-02-27 DIAGNOSIS — D692 Other nonthrombocytopenic purpura: Secondary | ICD-10-CM

## 2023-02-27 DIAGNOSIS — M4696 Unspecified inflammatory spondylopathy, lumbar region: Secondary | ICD-10-CM

## 2023-02-27 DIAGNOSIS — K219 Gastro-esophageal reflux disease without esophagitis: Secondary | ICD-10-CM

## 2023-02-27 DIAGNOSIS — Z780 Asymptomatic menopausal state: Secondary | ICD-10-CM

## 2023-02-27 DIAGNOSIS — E559 Vitamin D deficiency, unspecified: Secondary | ICD-10-CM

## 2023-02-27 DIAGNOSIS — F5081 Binge eating disorder, mild: Secondary | ICD-10-CM

## 2023-02-27 DIAGNOSIS — Z9884 Bariatric surgery status: Secondary | ICD-10-CM

## 2023-02-27 DIAGNOSIS — M8580 Other specified disorders of bone density and structure, unspecified site: Secondary | ICD-10-CM

## 2023-02-27 MED ORDER — WEGOVY 2.4 MG/0.75ML ~~LOC~~ SOAJ: 2.4000 mg | SUBCUTANEOUS | 0 refills | Status: DC

## 2023-02-27 MED ORDER — OMEPRAZOLE 40 MG PO CPDR: 40.0000 mg | DELAYED_RELEASE_CAPSULE | Freq: Every day | ORAL | 1 refills | Status: DC

## 2023-02-27 MED ORDER — LISDEXAMFETAMINE DIMESYLATE 50 MG PO CAPS
50.0000 mg | ORAL_CAPSULE | Freq: Every day | ORAL | 0 refills | Status: DC
  Filled 2023-02-27: qty 90, 90d supply, fill #0

## 2023-02-27 MED ORDER — VITAMIN D (ERGOCALCIFEROL) 1.25 MG (50000 UNIT) PO CAPS: 50000.0000 [IU] | ORAL_CAPSULE | ORAL | 1 refills | Status: DC

## 2023-02-27 NOTE — Progress Notes (Signed)
 Name: Terri Castillo   MRN: [id number]    DOB: 1950/10/23   Date:02/27/2023       Progress Note  Subjective  Chief Complaint  Chief Complaint  Patient presents with   Medical Management of Chronic Issues   HPI   Depression Mild in Remission:  Renessa has tried and failed a lot of medications including Prozac ,  Duloxetine  , Wellbutrin and Pristiq  ( worked but too expensive) She has been on Lexapro  for a while and is doing well. Vyvanse  was started because of binge eating disorder 12/2016. Vyvanse  helps with her focus and fatigue secondary to depression    History of bariatric surgery/binge Eating disorde/Morbid obesity: BMI over 35 with co-morbidities such as Depression, back pain/gerd : she had gastric bypass in 2010, her lowest weight was 180 lbs, but was gradually gained weight since, weight is stable in the 220's lbs range. She has tried multiple medications including Saxenda  and Wegovy . She has followed [facility] plan. She has been on Wegovy  since 02/2022 at a weight of 208 lbs, She ran out of medication about 6 weeks ago, weight was down to 195 lbs but is now up to 202.2 lbs again. She would like to continue Wegovy  since it curbs her appetite and has helped with weight loss. She has achieve over 5 % of weight loss on medication and will benefit from keeping her weight down as maintenance. 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 Taking Flexeril  for muscle spasms every night and symptoms are stable  Pain on her back is 6/10 on average but currently is a zero, Triggered by standing for a prolonged period of time   Heart rhythm change/PSVT : she noticed her iwatch picked up Afib back in September 2019 she saw Dr. Mady had a  holder monitor but did not show afib . No recent episodes of palpitation , and denies decrease in exercise tolerance   Hypothyroidism: last TSH was at goal again, continue current dose of levothyroxine , no change in bowel movements or dysphagia. Recheck it yearly    Senile purpura: reassurance given, unchanged    Osteopenia: she has a history of osteoporosis , treated in the past with Forteo, last bone density showed osteopenia Currently on Evista    Patient Active Problem List   Diagnosis Date Noted   Screening for colon cancer 09/14/2022   Osteopenia after menopause 02/23/2022   PSVT (paroxysmal supraventricular tachycardia) (HCC) 05/03/2018   PVC (premature ventricular contraction) 05/03/2018   PAC (premature atrial contraction) 05/03/2018   Palpitations 02/04/2018   Inflammatory spondylopathy of lumbar region (HCC) 05/22/2017   Senile purpura (HCC) 08/11/2016   Facet arthropathy, lumbar 02/02/2016   Spondylolisthesis 01/12/2016   Chronic bilateral low back pain 11/01/2015   Iron deficiency anemia 09/04/2014   Perioral dermatitis 09/04/2014   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   B12 deficiency 07/02/2014   Cervical pain 07/02/2014   Arthralgia of lower leg 07/02/2014   Insomnia 07/02/2014   Depression, major, recurrent, mild (HCC) 07/02/2014   Dyslipidemia 07/02/2014   Dermatitis, eczematoid 07/02/2014   Genital herpes in women 07/02/2014   Gastric reflux 07/02/2014   Bariatric surgery status 07/02/2014   Herpes simplex 07/02/2014   H/O: pneumonia 07/02/2014  Eczema intertrigo 07/02/2014   Adult BMI 30+ 07/02/2014   OP (osteoporosis) 07/02/2014   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   Perennial allergic rhinitis with seasonal variation 07/02/2014   Basal cell papilloma 07/02/2014   Vitamin D  deficiency 07/02/2014    Past Surgical History:  Procedure Laterality Date   ABDOMINAL HYSTERECTOMY  1994   CHOLECYSTECTOMY  2000   COLONOSCOPY     COLONOSCOPY WITH PROPOFOL  N/A 09/14/2022    Procedure: COLONOSCOPY WITH PROPOFOL ;  Surgeon: Therisa Bi, MD;  Location: [facility] ENDOSCOPY;  Service: Gastroenterology;  Laterality: N/A;   EYE SURGERY  2000   Cataract Removal   EYE SURGERY  2002   Cataract Removal   GASTRIC BYPASS  2000   Mini   GASTRIC BYPASS  2010    Family History  Problem Relation Age of Onset   Dementia Mother    COPD Mother    Osteoporosis Mother    Diabetes Father    Glaucoma Father    Parkinson's disease Father    Breast cancer Daughter    Brain cancer Daughter        Brain Tumor   Hypothyroidism Daughter    Cancer Daughter        breast   Heart disease Maternal Aunt    Heart disease Maternal Uncle    Glaucoma Brother    Heart Problems Brother    Heart attack Brother 37    Social History   Tobacco Use   Smoking status: Former    Current packs/day: 0.00    Average packs/day: 0.5 packs/day for 5.0 years (2.5 ttl pk-yrs)    Types: Cigarettes    Start date: 01/23/1985    Quit date: 1992    Years since quitting: 33.1   Smokeless tobacco: Never  Substance Use Topics   Alcohol use: Not Currently    Comment: 3 drinks/year     Current Outpatient Medications:    Calcium  Carb-Cholecalciferol (CALCIUM /VITAMIN D  PO), Take by mouth daily., Disp: , Rfl:    cetirizine (ZYRTEC) 10 MG tablet, Take 10 mg by mouth daily as needed. , Disp: , Rfl:    Cyanocobalamin  (B-12) 1000 MCG SUBL, Place 1 tablet under the tongue daily. Every other day, Disp: 45 tablet, Rfl: 1   cyclobenzaprine  (FLEXERIL ) 10 MG tablet, Take 1 tablet (10 mg total) by mouth at bedtime., Disp: 90 tablet, Rfl: 1   escitalopram  (LEXAPRO ) 20 MG tablet, TAKE 1 TABLET BY MOUTH EVERY DAY, Disp: 90 tablet, Rfl: 1   fluticasone  (FLONASE ) 50 MCG/ACT nasal spray, SPRAY 2 SPRAYS INTO EACH NOSTRIL EVERY DAY, Disp: 48 mL, Rfl: 1   ketoconazole  (NIZORAL ) 2 % cream, APPLY TO AFFECTED AREA TWICE A DAY FOR RASH, Disp: 90 g, Rfl: 0   levothyroxine  (SYNTHROID ) 25 MCG tablet, TAKE 1 TABLET BY MOUTH DAILY  BEFORE BREAKFAST., Disp: 90 tablet, Rfl: 0   PREMARIN  vaginal cream, Place 1 Applicatorful vaginally 3 (three) times a week., Disp: 42.5 g, Rfl: 12   raloxifene  (EVISTA ) 60 MG tablet, TAKE 1 TABLET BY MOUTH EVERY DAY, Disp: 90 tablet, Rfl: 4   triamcinolone  cream (KENALOG ) 0.1 %, Apply 1 application topically 2 (two) times daily. Mix with lotion at home, Disp: 453.6 g, Rfl: 0   valACYclovir  (VALTREX ) 500 MG tablet, Take 1 tablet (500 mg total) by mouth 3 (three) times daily., Disp: 30 tablet, Rfl: 0   lisdexamfetamine (VYVANSE ) 50 MG capsule, Take 1 capsule (50 mg total) by mouth daily., Disp: 90 capsule, Rfl: 0   omeprazole  (PRILOSEC) 40 MG  capsule, Take 1 capsule (40 mg total) by mouth daily., Disp: 90 capsule, Rfl: 1   Semaglutide -Weight Management (WEGOVY ) 2.4 MG/0.75ML SOAJ, Inject 2.4 mg into the skin once a week., Disp: 9 mL, Rfl: 0   Vitamin D , Ergocalciferol , (DRISDOL ) 1.25 MG (50000 UNIT) CAPS capsule, Take 1 capsule (50,000 Units total) by mouth every 14 (fourteen) days., Disp: 6 capsule, Rfl: 1  Allergies  Allergen Reactions   Topamax  [Topiramate]     severe depression   Trazodone     Other reaction(s): Dizziness    I personally reviewed active problem list, medication list, allergies, family history with the patient/caregiver today.   ROS  Ten systems reviewed and is negative except as mentioned in HPI    Objective  Vitals:   02/27/23 1314  BP: 116/76  Pulse: 95  Resp: 16  Temp: 97.7 F (36.5 C)  TempSrc: Oral  SpO2: 95%  Weight: 202 lb 3.2 oz (91.7 kg)  Height: 5' 3 (1.6 m)    Body mass index is 35.82 kg/m.  Physical Exam  Constitutional: Patient appears well-developed and well-nourished. Obese  No distress.  HEENT: head atraumatic, normocephalic, pupils equal and reactive to light, neck supple, throat within normal limits Cardiovascular: Normal rate, regular rhythm and normal heart sounds.  No murmur heard. No BLE edema. Pulmonary/Chest: Effort normal  and breath sounds normal. No respiratory distress. Abdominal: Soft.  There is no tenderness. Psychiatric: Patient has a normal mood and affect. behavior is normal. Judgment and thought content normal.   Diabetic Foot Exam:     PHQ2/9:    02/27/2023    1:10 PM 11/27/2022   11:05 AM 08/25/2022    8:24 AM 05/24/2022   10:25 AM 02/23/2022   10:10 AM  Depression screen PHQ 2/9  Decreased Interest 0 0 0 0 0  Down, Depressed, Hopeless 0 0 0 0 0  PHQ - 2 Score 0 0 0 0 0  Altered sleeping 0 0 0 0 3  Tired, decreased energy 0 0 0 0 3  Change in appetite 0 0 0 0 0  Feeling bad or failure about yourself  0 0 0 0 0  Trouble concentrating 0 0 0 0 0  Moving slowly or fidgety/restless 0 0 0 0 0  Suicidal thoughts 0 0 0 0 0  PHQ-9 Score 0 0 0 0 6  Difficult doing work/chores Not difficult at all        phq 9 is negative  Fall Risk:    02/27/2023    1:10 PM 11/27/2022   11:05 AM 08/25/2022    8:24 AM 05/24/2022   10:25 AM 02/23/2022   10:10 AM  Fall Risk   Falls in the past year? 0 0  0 0  Number falls in past yr: 0 0  0 0  Injury with Fall? 0 0  0 0  Risk for fall due to : No Fall Risks No Fall Risks No Fall Risks No Fall Risks No Fall Risks  Follow up Falls prevention discussed;Education provided;Falls evaluation completed Falls prevention discussed Falls prevention discussed Falls prevention discussed Falls prevention discussed     Assessment & Plan  1. Major depression in remission (HCC) (Primary)  Continue medication  2. PSVT (paroxysmal supraventricular tachycardia) (HCC)  Doing well at this time  3. Inflammatory spondylopathy of lumbar region (HCC)  stable  4. Morbid obesity (HCC)  - Semaglutide -Weight Management (WEGOVY ) 2.4 MG/0.75ML SOAJ; Inject 2.4 mg into the skin once a week.  Dispense:  9 mL; Refill: 0  5. Vitamin D  deficiency  - Vitamin D , Ergocalciferol , (DRISDOL ) 1.25 MG (50000 UNIT) CAPS capsule; Take 1 capsule (50,000 Units total) by mouth every 14 (fourteen) days.   Dispense: 6 capsule; Refill: 1  6. GERD without esophagitis  - omeprazole  (PRILOSEC) 40 MG capsule; Take 1 capsule (40 mg total) by mouth daily.  Dispense: 90 capsule; Refill: 1  7. Mild binge-eating disorder  - lisdexamfetamine (VYVANSE ) 50 MG capsule; Take 1 capsule (50 mg total) by mouth daily.  Dispense: 90 capsule; Refill: 0  8. Senile purpura (HCC)  Reassurance given   9. Acquired hypothyroidism  Continue thyroid  medication  10. Osteopenia after menopause  On Evista   11. Bariatric surgery status

## 2023-03-04 LAB — GENECONNECT MOLECULAR SCREEN: Genetic Analysis Overall Interpretation: NEGATIVE

## 2023-03-06 ENCOUNTER — Other Ambulatory Visit (HOSPITAL_COMMUNITY): Payer: Self-pay

## 2023-04-09 ENCOUNTER — Encounter: Payer: Self-pay | Admitting: Family Medicine

## 2023-04-29 ENCOUNTER — Other Ambulatory Visit: Payer: Self-pay | Admitting: Family Medicine

## 2023-04-29 DIAGNOSIS — E039 Hypothyroidism, unspecified: Secondary | ICD-10-CM

## 2023-05-25 ENCOUNTER — Other Ambulatory Visit: Payer: Self-pay | Admitting: Family Medicine

## 2023-05-25 DIAGNOSIS — F325 Major depressive disorder, single episode, in full remission: Secondary | ICD-10-CM

## 2023-05-25 MED ORDER — ESCITALOPRAM OXALATE 20 MG PO TABS: 20.0000 mg | ORAL_TABLET | Freq: Every day | ORAL | 0 refills | Status: DC

## 2023-05-25 NOTE — Addendum Note (Signed)
 Addended by: Larita Pluck on: 05/25/2023 08:34 AM   Modules accepted: Orders

## 2023-05-28 ENCOUNTER — Ambulatory Visit: Payer: Federal, State, Local not specified - PPO | Admitting: Family Medicine

## 2023-05-28 ENCOUNTER — Encounter: Payer: Self-pay | Admitting: Family Medicine

## 2023-05-28 DIAGNOSIS — I471 Supraventricular tachycardia, unspecified: Secondary | ICD-10-CM | POA: Diagnosis not present

## 2023-05-28 DIAGNOSIS — L304 Erythema intertrigo: Secondary | ICD-10-CM

## 2023-05-28 DIAGNOSIS — M4696 Unspecified inflammatory spondylopathy, lumbar region: Secondary | ICD-10-CM | POA: Diagnosis not present

## 2023-05-28 DIAGNOSIS — E559 Vitamin D deficiency, unspecified: Secondary | ICD-10-CM

## 2023-05-28 DIAGNOSIS — E538 Deficiency of other specified B group vitamins: Secondary | ICD-10-CM

## 2023-05-28 DIAGNOSIS — F325 Major depressive disorder, single episode, in full remission: Secondary | ICD-10-CM

## 2023-05-28 DIAGNOSIS — M8580 Other specified disorders of bone density and structure, unspecified site: Secondary | ICD-10-CM

## 2023-05-28 DIAGNOSIS — K219 Gastro-esophageal reflux disease without esophagitis: Secondary | ICD-10-CM

## 2023-05-28 DIAGNOSIS — G5601 Carpal tunnel syndrome, right upper limb: Secondary | ICD-10-CM

## 2023-05-28 DIAGNOSIS — Z9884 Bariatric surgery status: Secondary | ICD-10-CM

## 2023-05-28 DIAGNOSIS — E039 Hypothyroidism, unspecified: Secondary | ICD-10-CM

## 2023-05-28 DIAGNOSIS — F5081 Binge eating disorder, mild: Secondary | ICD-10-CM

## 2023-05-28 MED ORDER — LISDEXAMFETAMINE DIMESYLATE 50 MG PO CAPS: 50.0000 mg | ORAL_CAPSULE | Freq: Every day | ORAL | 0 refills | Status: DC

## 2023-05-28 MED ORDER — CYCLOBENZAPRINE HCL 10 MG PO TABS: 10.0000 mg | ORAL_TABLET | Freq: Every day | ORAL | 1 refills | Status: DC

## 2023-05-28 MED ORDER — CONTRAVE 8-90 MG PO TB12: ORAL_TABLET | ORAL | 0 refills | Status: DC

## 2023-05-28 MED ORDER — KETOCONAZOLE 2 % EX CREA: TOPICAL_CREAM | CUTANEOUS | 0 refills | Status: AC

## 2023-05-28 MED ORDER — ESCITALOPRAM OXALATE 20 MG PO TABS: 20.0000 mg | ORAL_TABLET | Freq: Every day | ORAL | 0 refills | Status: DC

## 2023-05-28 NOTE — Progress Notes (Signed)
 Name: Terri Castillo   MRN: [id number]    DOB: September 24, 1950   Date:05/28/2023       Progress Note  Subjective  Chief Complaint  Chief Complaint  Patient presents with   Medical Management of Chronic Issues   Discussed the use of AI scribe software for clinical note transcription with the patient, who gave verbal consent to proceed.  History of Present Illness Terri Castillo is a 73 year old female with obesity and binge eating disorder who presents with increased hunger and weight gain after discontinuing Wegovy .  She has experienced increased hunger and weight gain since discontinuing Wegovy  earlier this year  due to its cost. She has been without Wegovy  since February, and her weight has increased from 202 lbs to 211.1 lbs, with a BMI now at 37. She feels hungry and notes that Vyvanse , which she takes for binge eating disorder and depression, seems less effective since stopping Wegovy .  She has a history of binge eating disorder and has been taking Vyvanse  50 mg, which she finds effective for a few hours after taking it in the morning but not in the evenings. Her binge eating is becoming uncontrollable again, especially in the evenings, and she struggles with cravings and overeating.  Her past medical history includes reflux, inflammatory spondylopathy, back pain, and severe arthritis in the right thumb with carpal tunnel syndrome. She takes omeprazole  for reflux, which is effective. For back pain, she uses cyclobenzaprine  at night and Tylenol as needed. She has not pursued further treatment for her back pain or arthritis.  She has a history of depression, which is under control with escitalopram . She also has a history of paroxysmal supraventricular tachycardia but reports no current palpitations. She underwent bariatric surgery in the past and adheres to dietary restrictions related to this.  Her current medications include escitalopram , cyclobenzaprine , B12 supplements, Valtrex ,  vitamin D , raloxifene , and levothyroxine . She has a history of osteoporosis and previously received Forteo injections for two years, now maintained on raloxifene . Her thyroid  function was last checked with a TSH of 3.34, and she continues on levothyroxine  25 mcg daily.   Patient Active Problem List   Diagnosis Date Noted   Screening for colon cancer 09/14/2022   Osteopenia after menopause 02/23/2022   PSVT (paroxysmal supraventricular tachycardia) (HCC) 05/03/2018   PVC (premature ventricular contraction) 05/03/2018   PAC (premature atrial contraction) 05/03/2018   Palpitations 02/04/2018   Inflammatory spondylopathy of lumbar region (HCC) 05/22/2017   Senile purpura (HCC) 08/11/2016   Facet arthropathy, lumbar 02/02/2016   Spondylolisthesis 01/12/2016   Chronic bilateral low back pain 11/01/2015   Iron deficiency anemia 09/04/2014   Perioral dermatitis 09/04/2014   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   B12 deficiency 07/02/2014   Cervical pain 07/02/2014   Arthralgia of lower leg 07/02/2014   Insomnia 07/02/2014   Depression, major, recurrent, mild (HCC) 07/02/2014   Dyslipidemia 07/02/2014   Dermatitis, eczematoid 07/02/2014   Genital herpes in women 07/02/2014   Gastric reflux 07/02/2014   Bariatric surgery status 07/02/2014   Herpes simplex 07/02/2014   H/O: pneumonia 07/02/2014   Eczema intertrigo 07/02/2014   Adult BMI 30+ 07/02/2014   OP (osteoporosis) 07/02/2014   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   Perennial allergic rhinitis with seasonal variation 07/02/2014   Basal cell papilloma 07/02/2014   Vitamin D  deficiency 07/02/2014    Past Surgical History:  Procedure Laterality Date   ABDOMINAL HYSTERECTOMY  1994   CHOLECYSTECTOMY  2000   COLONOSCOPY     COLONOSCOPY WITH  PROPOFOL  N/A 09/14/2022   Procedure: COLONOSCOPY WITH PROPOFOL ;  Surgeon: Luke Salaam, MD;  Location: [facility];  Service: Gastroenterology;  Laterality: N/A;   EYE SURGERY  2000   Cataract Removal   EYE  SURGERY  2002   Cataract Removal   GASTRIC BYPASS  2000   Mini   GASTRIC BYPASS  2010    Family History  Problem Relation Age of Onset   Dementia Mother    COPD Mother    Osteoporosis Mother    Diabetes Father    Glaucoma Father    Parkinson's disease Father    Breast cancer Daughter    Brain cancer Daughter        Brain Tumor   Hypothyroidism Daughter    Cancer Daughter        breast   Heart disease Maternal Aunt    Heart disease Maternal Uncle    Glaucoma Brother    Heart Problems Brother    Heart attack Brother 90    Social History   Tobacco Use   Smoking status: Former    Current packs/day: 0.00    Average packs/day: 0.5 packs/day for 5.0 years (2.5 ttl pk-yrs)    Types: Cigarettes    Start date: 01/23/1985    Quit date: 1992    Years since quitting: 33.3   Smokeless tobacco: Never  Substance Use Topics   Alcohol use: Not Currently    Comment: 3 drinks/year     Current Outpatient Medications:    Calcium  Carb-Cholecalciferol (CALCIUM /VITAMIN D  PO), Take by mouth daily., Disp: , Rfl:    cetirizine (ZYRTEC) 10 MG tablet, Take 10 mg by mouth daily as needed. , Disp: , Rfl:    Cyanocobalamin  (B-12) 1000 MCG SUBL, Place 1 tablet under the tongue daily. Every other day, Disp: 45 tablet, Rfl: 1   cyclobenzaprine  (FLEXERIL ) 10 MG tablet, Take 1 tablet (10 mg total) by mouth at bedtime., Disp: 90 tablet, Rfl: 1   escitalopram  (LEXAPRO ) 20 MG tablet, Take 1 tablet (20 mg total) by mouth daily., Disp: 90 tablet, Rfl: 0   fluticasone  (FLONASE ) 50 MCG/ACT nasal spray, SPRAY 2 SPRAYS INTO EACH NOSTRIL EVERY DAY, Disp: 48 mL, Rfl: 1   ketoconazole  (NIZORAL ) 2 % cream, APPLY TO AFFECTED AREA TWICE A DAY FOR RASH, Disp: 90 g, Rfl: 0   levothyroxine  (SYNTHROID ) 25 MCG tablet, TAKE 1 TABLET BY MOUTH EVERY DAY BEFORE BREAKFAST, Disp: 90 tablet, Rfl: 0   lisdexamfetamine (VYVANSE ) 50 MG capsule, Take 1 capsule (50 mg total) by mouth daily., Disp: 90 capsule, Rfl: 0   omeprazole   (PRILOSEC) 40 MG capsule, Take 1 capsule (40 mg total) by mouth daily., Disp: 90 capsule, Rfl: 1   PREMARIN  vaginal cream, Place 1 Applicatorful vaginally 3 (three) times a week., Disp: 42.5 g, Rfl: 12   raloxifene  (EVISTA ) 60 MG tablet, TAKE 1 TABLET BY MOUTH EVERY DAY, Disp: 90 tablet, Rfl: 4   triamcinolone  cream (KENALOG ) 0.1 %, Apply 1 application topically 2 (two) times daily. Mix with lotion at home, Disp: 453.6 g, Rfl: 0   valACYclovir  (VALTREX ) 500 MG tablet, Take 1 tablet (500 mg total) by mouth 3 (three) times daily., Disp: 30 tablet, Rfl: 0   Vitamin D , Ergocalciferol , (DRISDOL ) 1.25 MG (50000 UNIT) CAPS capsule, Take 1 capsule (50,000 Units total) by mouth every 14 (fourteen) days., Disp: 6 capsule, Rfl: 1   Semaglutide -Weight Management (WEGOVY ) 2.4 MG/0.75ML SOAJ, Inject 2.4 mg into the skin once a week. (Patient not taking: Reported on  05/28/2023), Disp: 9 mL, Rfl: 0  Allergies  Allergen Reactions   Topamax  [Topiramate]     severe depression   Trazodone     Other reaction(s): Dizziness    I personally reviewed active problem list, medication list, allergies, family history with the patient/caregiver today.   ROS  Ten systems reviewed and is negative except as mentioned in HPI    Objective Physical Exam Constitutional: Patient appears well-developed and well-nourished. Obese  No distress.  HEENT: head atraumatic, normocephalic, pupils equal and reactive to light, neck supple Cardiovascular: Normal rate, regular rhythm and normal heart sounds.  No murmur heard. No BLE edema. Pulmonary/Chest: Effort normal and breath sounds normal. No respiratory distress. Abdominal: Soft.  There is no tenderness. Psychiatric: Patient has a normal mood and affect. behavior is normal. Judgment and thought content normal.    Vitals:   05/28/23 1304  BP: 118/74  Pulse: 98  Resp: 16  SpO2: 97%  Weight: 211 lb 1.6 oz (95.8 kg)  Height: 5\' 3"  (1.6 m)    Body mass index is 37.39  kg/m.    PHQ2/9:    05/28/2023    1:03 PM 02/27/2023    1:10 PM 11/27/2022   11:05 AM 08/25/2022    8:24 AM 05/24/2022   10:25 AM  Depression screen PHQ 2/9  Decreased Interest 0 0 0 0 0  Down, Depressed, Hopeless 0 0 0 0 0  PHQ - 2 Score 0 0 0 0 0  Altered sleeping 0 0 0 0 0  Tired, decreased energy 0 0 0 0 0  Change in appetite 0 0 0 0 0  Feeling bad or failure about yourself  0 0 0 0 0  Trouble concentrating 0 0 0 0 0  Moving slowly or fidgety/restless 0 0 0 0 0  Suicidal thoughts 0 0 0 0 0  PHQ-9 Score 0 0 0 0 0  Difficult doing work/chores Not difficult at all Not difficult at all       phq 9 is negative  Fall Risk:    05/28/2023    1:00 PM 02/27/2023    1:10 PM 11/27/2022   11:05 AM 08/25/2022    8:24 AM 05/24/2022   10:25 AM  Fall Risk   Falls in the past year? 0 0 0  0  Number falls in past yr: 0 0 0  0  Injury with Fall? 0 0 0  0  Risk for fall due to : No Fall Risks No Fall Risks No Fall Risks No Fall Risks No Fall Risks  Follow up Falls prevention discussed;Education provided;Falls evaluation completed Falls prevention discussed;Education provided;Falls evaluation completed Falls prevention discussed Falls prevention discussed Falls prevention discussed     Assessment & Plan Morbid Obesity BMI 37 with recent weight gain. Previous Wegovy  use stopped due to cost, leading to increased hunger. Vyvanse  ineffective. Contrave considered despite Topamax allergy. - Prescribe Contrave, titrate to two pills twice daily. - Send prescription to [facility] pharmacy for discount. - Advise low-calorie, high-fiber diet. - Encourage physical activity.  Binge eating disorder Exacerbated by Wegovy  cessation. Vyvanse  ineffective in evenings. Contrave discussed for evening cravings. - Prescribe Contrave  - Continue Vyvanse  50 mg in the morning.  Bariatric surgery Weight gain and hunger may be exacerbated by previous surgery. Discussed importance of weight management.  Depression in  remission Depression in remission with Lexapro . Weight gain not affecting depression. - Continue Lexapro .  Gastroesophageal reflux disease (GERD) GERD well-managed. - Continue omeprazole .  Paroxysmal  supraventricular tachycardia (PSVT) No recent palpitations or skipped beats.  Inflammatory spondylopathy of lumbar spine Managed with Flexeril  and Tylenol. No radiating pain or walking difficulties. - Continue Flexeril  and Tylenol as needed.  Severe arthritis of right thumb Severe arthritis with limited motion. Discussed surgery but not ready.  Carpal tunnel syndrome, right wrist Moderate syndrome. Discussed surgery with thumb arthritis but undecided.  General Health Maintenance Encouraged healthy diet and physical activity. - Encourage healthy snacks like nuts and salads. - Advise on maintaining physical activity.  Follow-up Plan to monitor weight and medication effectiveness. - Schedule follow-up in three months. - Monitor for issues with Contrave access and adjust plan.

## 2023-06-07 ENCOUNTER — Telehealth: Payer: Self-pay

## 2023-06-07 NOTE — Telephone Encounter (Signed)
 Copied from CRM [id number]. Topic: Clinical - Prescription Issue >> Jun 07, 2023 12:33 PM Star East wrote: Reason for CRM: Rayne calling about prior auth for medication Naltrexone-buPROPion HCl ER (CONTRAVE ) 8-90 MG TB12- resending fax

## 2023-06-08 ENCOUNTER — Telehealth: Payer: Self-pay | Admitting: Pharmacy Technician

## 2023-06-08 ENCOUNTER — Other Ambulatory Visit (HOSPITAL_COMMUNITY): Payer: Self-pay

## 2023-06-08 NOTE — Telephone Encounter (Signed)
 Pharmacy Patient Advocate Encounter   Received notification from Pt Calls Messages that prior authorization for Contrave  8-90MG  er tablets is required/requested.   Insurance verification completed.   The patient is insured through [facility] MED[employer].   Per test claim: The medication requested is not covered by Medicare Part D Law.  CMM gives a hard stop with this message and they will not submit for PA.

## 2023-06-08 NOTE — Telephone Encounter (Signed)
.  ops

## 2023-06-11 NOTE — Telephone Encounter (Signed)
 Good morning Jamie, I totally goofed with that ops, it's 1 of our dot phrases please disregard. I could not figure out how to delete it either.  Have a good week!!

## 2023-06-13 NOTE — Telephone Encounter (Signed)
 Please advice CONTRAVE  not covered with her insurance

## 2023-06-13 NOTE — Telephone Encounter (Signed)
 Copied from CRM [id number]. Topic: General - Other >> Jun 13, 2023 10:55 AM Juluis Ok wrote: Reason for CRM: Ronnie with [facility] calling to check the status of a fax that was sent on 05/19 for a PA for Contrave . Ask that fax be returned.  Callback # [phone]

## 2023-06-15 NOTE — Telephone Encounter (Signed)
 Ronnie from [facility] called in about  for contrave . She states form was sen on May 21, but does this pt already have this med? Looks like that's what the notes say here.

## 2023-06-15 NOTE — Telephone Encounter (Signed)
 Pt states is getting it from [facility] and paying out of pocket.

## 2023-06-29 ENCOUNTER — Other Ambulatory Visit: Payer: Self-pay

## 2023-06-29 MED ORDER — CONTRAVE 8-90 MG PO TB12: 2.0000 | ORAL_TABLET | Freq: Two times a day (BID) | ORAL | 1 refills | Status: DC

## 2023-07-20 ENCOUNTER — Other Ambulatory Visit: Payer: Self-pay | Admitting: Family Medicine

## 2023-07-28 ENCOUNTER — Other Ambulatory Visit: Payer: Self-pay | Admitting: Family Medicine

## 2023-07-28 DIAGNOSIS — E039 Hypothyroidism, unspecified: Secondary | ICD-10-CM

## 2023-08-16 ENCOUNTER — Other Ambulatory Visit: Payer: Self-pay | Admitting: Family Medicine

## 2023-08-16 DIAGNOSIS — E559 Vitamin D deficiency, unspecified: Secondary | ICD-10-CM

## 2023-08-22 ENCOUNTER — Other Ambulatory Visit: Payer: Self-pay | Admitting: Family Medicine

## 2023-08-22 DIAGNOSIS — K219 Gastro-esophageal reflux disease without esophagitis: Secondary | ICD-10-CM

## 2023-08-22 MED ORDER — OMEPRAZOLE 40 MG PO CPDR: 40.0000 mg | DELAYED_RELEASE_CAPSULE | Freq: Every day | ORAL | 1 refills | Status: DC

## 2023-08-22 NOTE — Addendum Note (Signed)
 Addended by: GLENARD DORETTE FALCON on: 08/22/2023 08:48 AM   Modules accepted: Orders

## 2023-08-27 ENCOUNTER — Telehealth: Payer: Self-pay | Admitting: Pharmacy Technician

## 2023-08-27 ENCOUNTER — Other Ambulatory Visit (HOSPITAL_COMMUNITY): Payer: Self-pay

## 2023-08-27 NOTE — Telephone Encounter (Signed)
 Pharmacy Patient Advocate Encounter   Received notification from Onbase that prior authorization for Cyclobenzaprine  10 mg tablets is required/requested.   Insurance verification completed.   The patient is insured through [facility] .   Per test claim: PA required and submitted KEY/EOC/Request #: [id number] from 05/29/23 to 11/25/23. Ran test claim, Copay is $1.35. This test claim was processed through [facility]- copay amounts may vary at other pharmacies due to pharmacy/plan contracts, or as the patient moves through the different stages of their insurance plan.

## 2023-08-30 ENCOUNTER — Encounter: Payer: Self-pay | Admitting: Family Medicine

## 2023-08-31 ENCOUNTER — Other Ambulatory Visit: Payer: Self-pay | Admitting: Nurse Practitioner

## 2023-08-31 DIAGNOSIS — B001 Herpesviral vesicular dermatitis: Secondary | ICD-10-CM

## 2023-08-31 DIAGNOSIS — A6009 Herpesviral infection of other urogenital tract: Secondary | ICD-10-CM

## 2023-08-31 MED ORDER — VALACYCLOVIR HCL 500 MG PO TABS
500.0000 mg | ORAL_TABLET | Freq: Three times a day (TID) | ORAL | 0 refills | Status: AC
Start: 2023-08-31 — End: ?

## 2023-09-10 ENCOUNTER — Ambulatory Visit: Admitting: Family Medicine

## 2023-09-13 IMAGING — MG MM DIGITAL SCREENING BILAT W/ TOMO AND CAD
8 series · 8 of 24 positions shown · non-contrast
Comparison: Previous exam(s).

CLINICAL DATA: Screening.

EXAM:
DIGITAL SCREENING BILATERAL MAMMOGRAM WITH TOMOSYNTHESIS AND CAD
TECHNIQUE: Bilateral screening digital craniocaudal and mediolateral oblique
mammograms were obtained. Bilateral screening digital breast
tomosynthesis was performed. The images were evaluated with
computer-aided detection.

[R CC synth-2D]
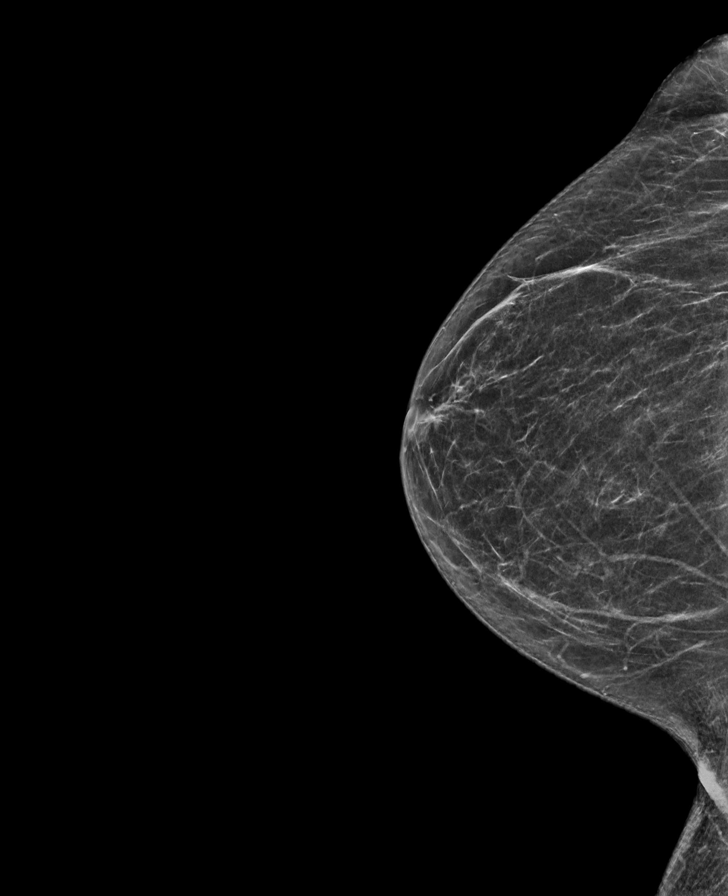

[L CC synth-2D]
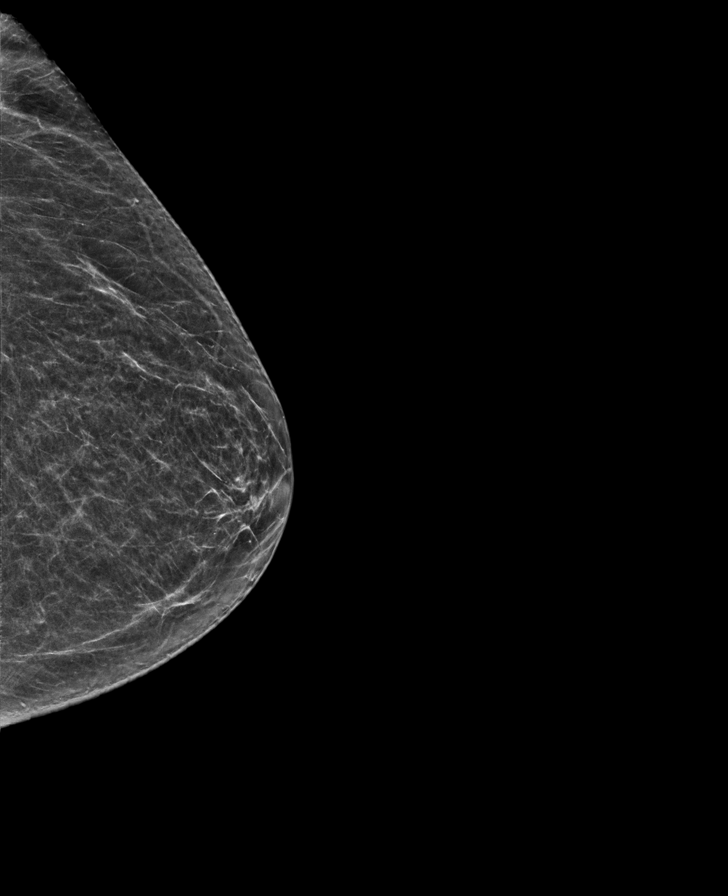

[L MLO synth-2D]
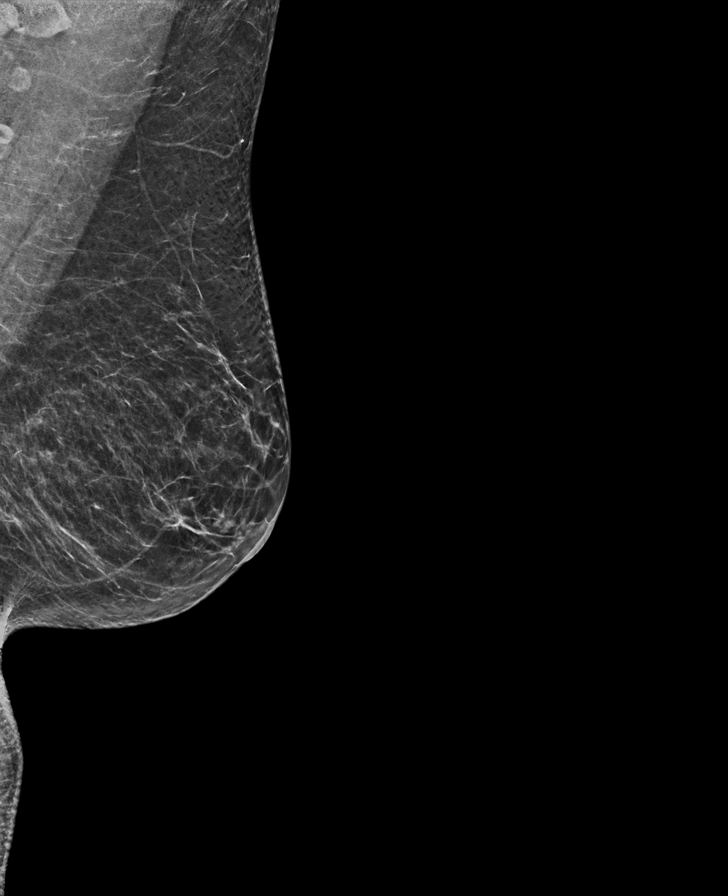

[R MLO synth-2D]
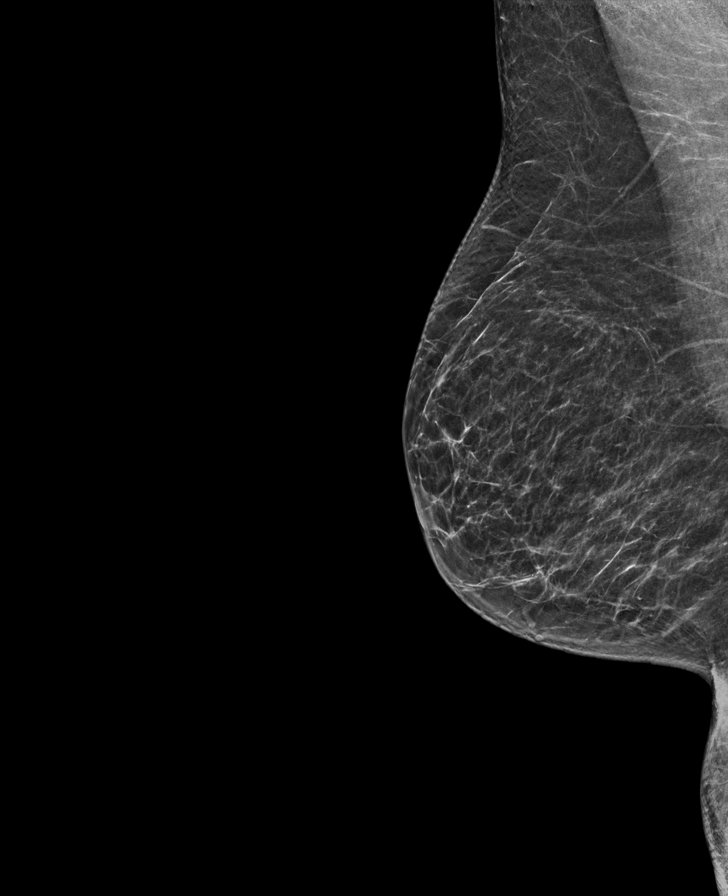

[L MLO tomo · tomo slice 30/59.0]
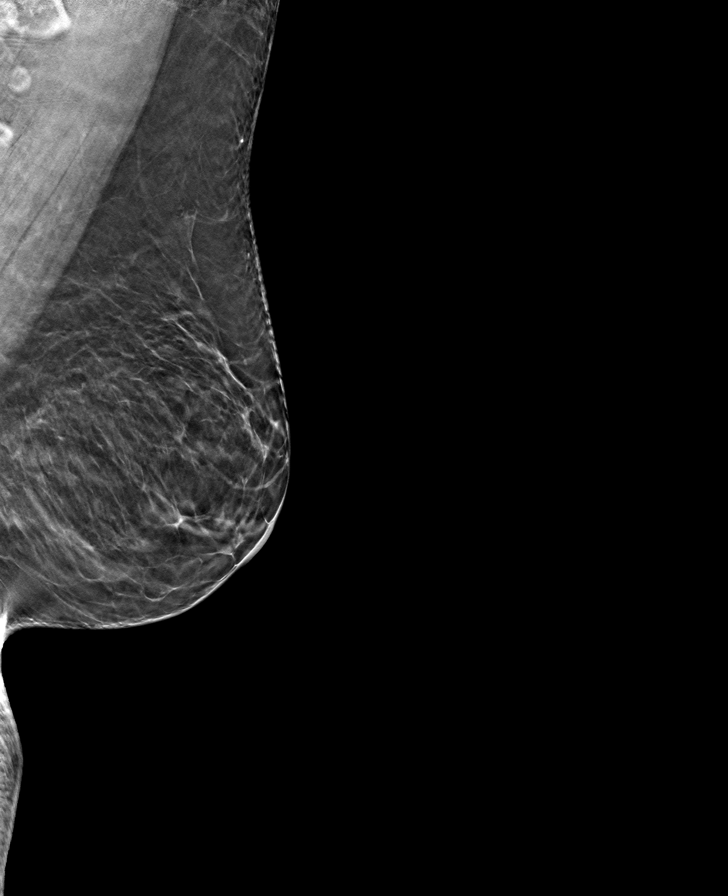

[R MLO tomo · tomo slice 30/59.0]
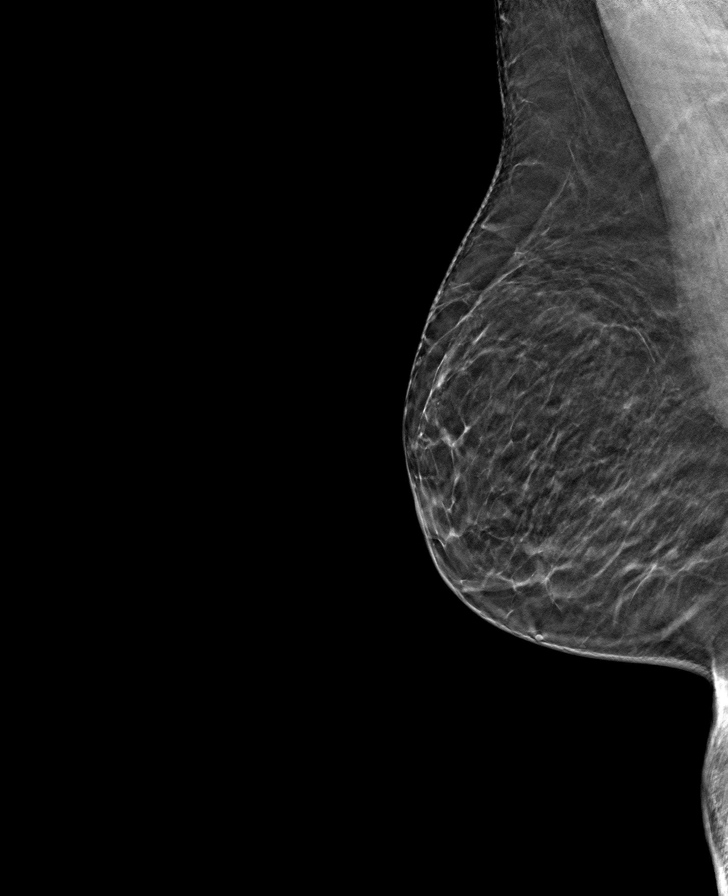

[R CC tomo · tomo slice 32/63.0]
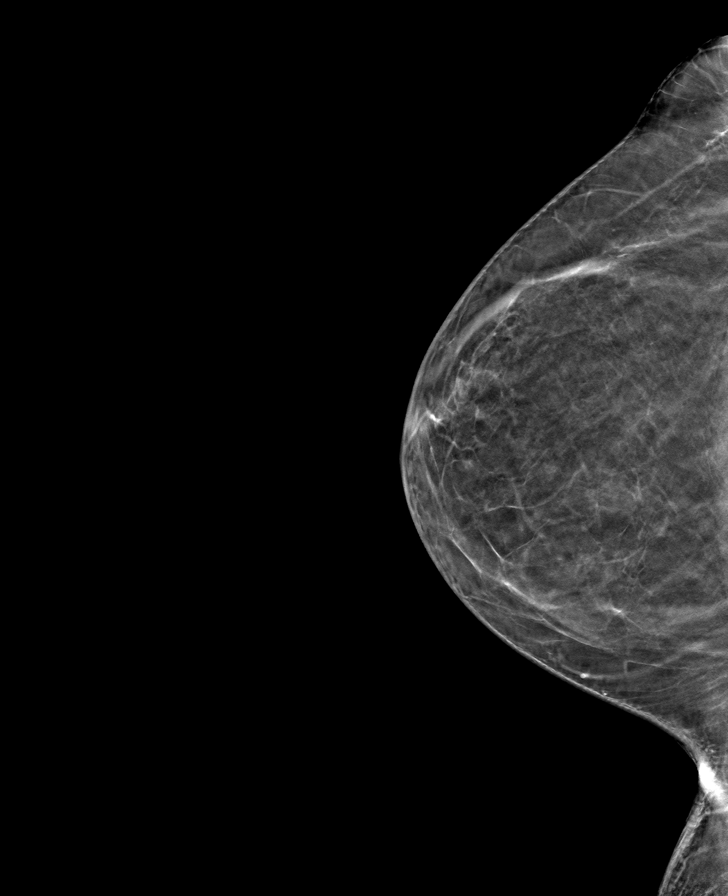

[L CC tomo · tomo slice 31/60.0]
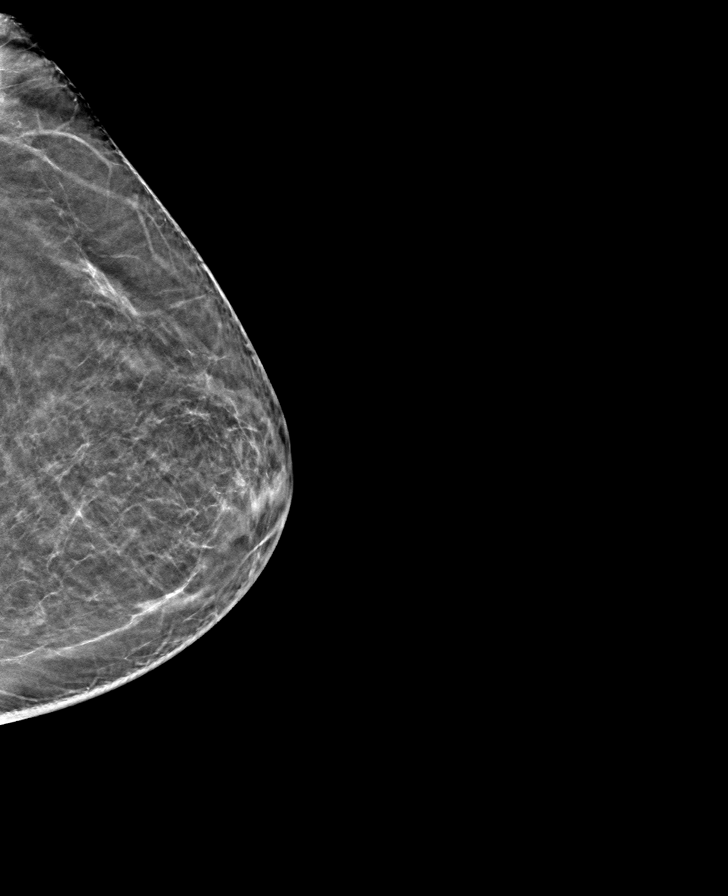

[8 of 24 positions shown; findings below may reference images not displayed]

ACR Breast Density Category b: There are scattered areas of
fibroglandular density.
FINDINGS: There are no findings suspicious for malignancy.
IMPRESSION: No mammographic evidence of malignancy. A result letter of this
screening mammogram will be mailed directly to the patient.

RECOMMENDATION:
Screening mammogram in one year. (Code:51-O-LD2)

BI-RADS CATEGORY  1: Negative.

## 2023-10-09 ENCOUNTER — Other Ambulatory Visit: Payer: Self-pay | Admitting: Family Medicine

## 2023-10-09 NOTE — Telephone Encounter (Signed)
 Lvm asking pt to return call to schedule appt for medication refills. Dr Glenard is willing to work her in

## 2023-10-10 ENCOUNTER — Other Ambulatory Visit: Payer: Self-pay

## 2023-10-10 ENCOUNTER — Ambulatory Visit (INDEPENDENT_AMBULATORY_CARE_PROVIDER_SITE_OTHER): Admitting: Family Medicine

## 2023-10-10 ENCOUNTER — Encounter: Payer: Self-pay | Admitting: Family Medicine

## 2023-10-10 DIAGNOSIS — M8580 Other specified disorders of bone density and structure, unspecified site: Secondary | ICD-10-CM

## 2023-10-10 DIAGNOSIS — E785 Hyperlipidemia, unspecified: Secondary | ICD-10-CM

## 2023-10-10 DIAGNOSIS — Z9884 Bariatric surgery status: Secondary | ICD-10-CM

## 2023-10-10 DIAGNOSIS — R739 Hyperglycemia, unspecified: Secondary | ICD-10-CM

## 2023-10-10 DIAGNOSIS — E039 Hypothyroidism, unspecified: Secondary | ICD-10-CM

## 2023-10-10 DIAGNOSIS — M4696 Unspecified inflammatory spondylopathy, lumbar region: Secondary | ICD-10-CM

## 2023-10-10 DIAGNOSIS — I471 Supraventricular tachycardia, unspecified: Secondary | ICD-10-CM

## 2023-10-10 DIAGNOSIS — F5081 Binge eating disorder, mild: Secondary | ICD-10-CM

## 2023-10-10 DIAGNOSIS — K219 Gastro-esophageal reflux disease without esophagitis: Secondary | ICD-10-CM

## 2023-10-10 DIAGNOSIS — F330 Major depressive disorder, recurrent, mild: Secondary | ICD-10-CM | POA: Diagnosis not present

## 2023-10-10 DIAGNOSIS — E559 Vitamin D deficiency, unspecified: Secondary | ICD-10-CM

## 2023-10-10 DIAGNOSIS — E538 Deficiency of other specified B group vitamins: Secondary | ICD-10-CM

## 2023-10-10 MED ORDER — LISDEXAMFETAMINE DIMESYLATE 50 MG PO CAPS
50.0000 mg | ORAL_CAPSULE | Freq: Every day | ORAL | 0 refills | Status: DC
  Filled 2023-10-10 – 2023-10-11 (×2): qty 90, 90d supply, fill #0

## 2023-10-10 MED ORDER — LEVOTHYROXINE SODIUM 25 MCG PO TABS: 25.0000 ug | ORAL_TABLET | Freq: Every day | ORAL | 0 refills | Status: DC

## 2023-10-10 MED ORDER — ESCITALOPRAM OXALATE 20 MG PO TABS: 20.0000 mg | ORAL_TABLET | Freq: Every day | ORAL | 0 refills | Status: DC

## 2023-10-10 MED ORDER — CYCLOBENZAPRINE HCL 10 MG PO TABS: 10.0000 mg | ORAL_TABLET | Freq: Every day | ORAL | 0 refills | Status: DC

## 2023-10-10 NOTE — Telephone Encounter (Signed)
 Spoke with pt and she has agreed to come today at [phone]

## 2023-10-10 NOTE — Progress Notes (Signed)
 Name: Terri Castillo   MRN: [id number]    DOB: [birth year]   Date:10/10/2023       Progress Note  Subjective  Chief Complaint  Chief Complaint  Patient presents with   Medical Management of Chronic Issues   Discussed the use of AI scribe software for clinical note transcription with the patient, who gave verbal consent to proceed.  History of Present Illness Terri Castillo is a 73 year old female with morbid obesity and binge eating disorder who presents for medication refills.  She has a BMI of 37.98 and previously took Wegovy  but discontinued it due to cost, which led to increased hunger. She is currently taking Vyvanse  50 mg in the morning, which helps during the day but not at night. She tried Contrave  but found it ineffective, particularly in the evenings when she tends to binge eat. She has stopped Vyvanse  for the past two to three weeks to assess the effect of Contrave  alone, noting that her hunger is more psychological than physical. Her weight has increased from 202 lbs in February to 214 lbs currently.  She has a history of paroxysmal supraventricular tachycardia (PSVT) and is aware that Vyvanse , being a stimulant, may affect her heart rate. She is not currently experiencing symptoms of PSVT.  She takes Lexapro  (citalopram) 20 mg for anxiety and depression, which she feels is effective despite current stressors. She also takes levothyroxine  25 mcg for thyroid  management.  She has a history of inflammatory spondylopathy of the lumbar region and experiences low back pain. She takes Flexeril  and is undergoing physical therapy.   She also takes Evista  for osteoporosis, following previous treatment with Forteo.  She has a history of bariatric surgery, which necessitates monitoring for vitamin deficiencies. She takes omeprazole  for reflux and has been advised to take vitamin D  supplements over the counter.    Patient Active Problem List   Diagnosis Date Noted   Acquired  hypothyroidism 05/28/2023   Mild binge-eating disorder 05/28/2023   Major depression in remission (HCC) 05/28/2023   Morbid obesity (HCC) 05/28/2023   Screening for colon cancer 09/14/2022   Osteopenia after menopause 02/23/2022   PSVT (paroxysmal supraventricular tachycardia) (HCC) 05/03/2018   PVC (premature ventricular contraction) 05/03/2018   PAC (premature atrial contraction) 05/03/2018   Palpitations 02/04/2018   Inflammatory spondylopathy of lumbar region (HCC) 05/22/2017   Senile purpura (HCC) 08/11/2016   Facet arthropathy, lumbar 02/02/2016   Spondylolisthesis 01/12/2016   Chronic bilateral low back pain 11/01/2015   Iron deficiency anemia 09/04/2014   Perioral dermatitis 09/04/2014   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   B12 deficiency 07/02/2014   Cervical pain 07/02/2014   Arthralgia of lower leg 07/02/2014   Insomnia 07/02/2014   Depression, major, recurrent, mild (HCC) 07/02/2014   Dyslipidemia 07/02/2014   Dermatitis, eczematoid 07/02/2014   Genital herpes in women 07/02/2014   GERD without esophagitis 07/02/2014   Bariatric surgery status 07/02/2014   Herpes simplex 07/02/2014   H/O: pneumonia 07/02/2014   Intertrigo 07/02/2014   Adult BMI 30+ 07/02/2014   OP (osteoporosis) 07/02/2014   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   Perennial allergic rhinitis with seasonal variation 07/02/2014   Basal cell papilloma 07/02/2014   Vitamin D  deficiency 07/02/2014    Past Surgical History:  Procedure Laterality Date   ABDOMINAL HYSTERECTOMY  1994   CHOLECYSTECTOMY  2000   COLONOSCOPY     COLONOSCOPY WITH PROPOFOL  N/A 09/14/2022   Procedure: COLONOSCOPY WITH PROPOFOL ;  Surgeon: Therisa Bi, MD;  Location: [facility] ENDOSCOPY;  Service:  Gastroenterology;  Laterality: N/A;   EYE SURGERY  2000   Cataract Removal   EYE SURGERY  2002   Cataract Removal   GASTRIC BYPASS  2000   Mini   GASTRIC BYPASS  2010   SMALL INTESTINE SURGERY  2000,2010   TUBAL LIGATION      Family History   Problem Relation Age of Onset   Dementia Mother    COPD Mother    Osteoporosis Mother    Arthritis Mother    Diabetes Father    Glaucoma Father    Parkinson's disease Father    Vision loss Father    Breast cancer Daughter    Brain cancer Daughter        Brain Tumor   Hypothyroidism Daughter    Cancer Daughter        breast   Miscarriages / Stillbirths Daughter    Heart disease Maternal Aunt    Heart disease Maternal Uncle    Glaucoma Brother    Heart Problems Brother    Heart attack Brother 62   Diabetes Paternal Grandfather    Heart disease Brother    Miscarriages / [place] Sister     Social History   Tobacco Use   Smoking status: Former    Current packs/day: 0.00    Average packs/day: 0.5 packs/day for 5.0 years (2.5 ttl pk-yrs)    Types: Cigarettes    Start date: 01/23/1985    Quit date: 1992    Years since quitting: 33.7   Smokeless tobacco: Never  Substance Use Topics   Alcohol use: Not Currently    Comment: 3 drinks/year     Current Outpatient Medications:    Calcium  Carb-Cholecalciferol (CALCIUM /VITAMIN D  PO), Take by mouth daily., Disp: , Rfl:    cetirizine (ZYRTEC) 10 MG tablet, Take 10 mg by mouth daily as needed. , Disp: , Rfl:    Cholecalciferol (VITAMIN D3) 50 MCG (2000 UT) CAPS, Take 1 capsule by mouth daily with breakfast., Disp: , Rfl:    Cyanocobalamin  (B-12) 1000 MCG SUBL, Place 1 tablet under the tongue daily. Every other day, Disp: 45 tablet, Rfl: 1   cyclobenzaprine  (FLEXERIL ) 10 MG tablet, Take 1 tablet (10 mg total) by mouth at bedtime., Disp: 90 tablet, Rfl: 1   escitalopram  (LEXAPRO ) 20 MG tablet, Take 1 tablet (20 mg total) by mouth daily., Disp: 90 tablet, Rfl: 0   fluticasone  (FLONASE ) 50 MCG/ACT nasal spray, SPRAY 2 SPRAYS INTO EACH NOSTRIL EVERY DAY, Disp: 48 mL, Rfl: 1   ketoconazole  (NIZORAL ) 2 % cream, APPLY TO AFFECTED AREA TWICE A DAY FOR RASH, Disp: 90 g, Rfl: 0   levothyroxine  (SYNTHROID ) 25 MCG tablet, TAKE 1 TABLET BY  MOUTH EVERY DAY BEFORE BREAKFAST, Disp: 30 tablet, Rfl: 0   lisdexamfetamine (VYVANSE ) 50 MG capsule, Take 1 capsule (50 mg total) by mouth daily., Disp: 90 capsule, Rfl: 0   Naltrexone-buPROPion HCl ER (CONTRAVE ) 8-90 MG TB12, Take 2 tablets by mouth 2 (two) times daily., Disp: 120 tablet, Rfl: 1   omeprazole  (PRILOSEC) 40 MG capsule, Take 1 capsule (40 mg total) by mouth daily., Disp: 90 capsule, Rfl: 1   PREMARIN  vaginal cream, Place 1 Applicatorful vaginally 3 (three) times a week., Disp: 42.5 g, Rfl: 12   raloxifene  (EVISTA ) 60 MG tablet, TAKE 1 TABLET BY MOUTH EVERY DAY, Disp: 90 tablet, Rfl: 4   triamcinolone  cream (KENALOG ) 0.1 %, Apply 1 application topically 2 (two) times daily. Mix with lotion at home, Disp: 453.6 g, Rfl: 0  valACYclovir  (VALTREX ) 500 MG tablet, Take 1 tablet (500 mg total) by mouth 3 (three) times daily., Disp: 30 tablet, Rfl: 0  Allergies  Allergen Reactions   Topamax  [Topiramate]     severe depression   Trazodone     Other reaction(s): Dizziness    I personally reviewed active problem list, medication list, allergies, family history with the patient/caregiver today.   ROS  Ten systems reviewed and is negative except as mentioned in HPI    Objective Physical Exam MEASUREMENTS: Weight- 214, BMI- 37.98. CONSTITUTIONAL: Patient appears well-developed and well-nourished.  No distress. HEENT: Head atraumatic, normocephalic, neck supple. CARDIOVASCULAR: Normal rate, regular rhythm and normal heart sounds.  No murmur heard. No BLE edema. PULMONARY: Effort normal and breath sounds normal. No respiratory distress. ABDOMINAL: There is no tenderness or distention. MUSCULOSKELETAL: Normal gait. Without gross motor or sensory deficit. PSYCHIATRIC: Patient has a normal mood and affect. behavior is normal. Judgment and thought content normal.  Vitals:   10/10/23 1539  BP: 130/74  Pulse: 95  Resp: 16  SpO2: 98%  Weight: 214 lb 6.4 oz (97.3 kg)  Height: 5' 3  (1.6 m)    Body mass index is 37.98 kg/m.    PHQ2/9:    10/10/2023    3:35 PM 05/28/2023    1:03 PM 02/27/2023    1:10 PM 11/27/2022   11:05 AM 08/25/2022    8:24 AM  Depression screen PHQ 2/9  Decreased Interest 0 0 0 0 0  Down, Depressed, Hopeless 0 0 0 0 0  PHQ - 2 Score 0 0 0 0 0  Altered sleeping 0 0 0 0 0  Tired, decreased energy 0 0 0 0 0  Change in appetite 0 0 0 0 0  Feeling bad or failure about yourself  0 0 0 0 0  Trouble concentrating 0 0 0 0 0  Moving slowly or fidgety/restless 0 0 0 0 0  Suicidal thoughts 0 0 0 0 0  PHQ-9 Score 0 0 0 0 0  Difficult doing work/chores Not difficult at all Not difficult at all Not difficult at all      phq 9 is negative  Fall Risk:    10/10/2023    3:35 PM 05/28/2023    1:00 PM 02/27/2023    1:10 PM 11/27/2022   11:05 AM 08/25/2022    8:24 AM  Fall Risk   Falls in the past year? 0 0 0 0   Number falls in past yr: 0 0 0 0   Injury with Fall? 0 0 0 0   Risk for fall due to : No Fall Risks No Fall Risks No Fall Risks No Fall Risks No Fall Risks  Follow up Falls evaluation completed Falls prevention discussed;Education provided;Falls evaluation completed Falls prevention discussed;Education provided;Falls evaluation completed Falls prevention discussed Falls prevention discussed      Assessment & Plan Morbid obesity with binge eating disorder BMI 37.98, weight increased. Vyvanse  preferred for appetite control. Discussed balanced meals and avoiding snacking. - Discontinue Contrave  by tapering: reduce to three pills a day for a few days, then two pills a day for a few days, then one pill a day before stopping. - Resume Vyvanse  50 mg in the morning. - Discuss dietary changes: increase protein intake, avoid snacking, eat balanced meals with nutritional value. - Send Vyvanse  prescription to local pharmacy.  Major depressive disorder and anxiety disorder Managed with citalopram 20 mg. Stress due to family issues, coping well. - Continue  citalopram 20 mg daily. - Encourage finding personal outlets for stress relief, such as reading or social activities.  Hypothyroidism Managed with levothyroxine  25 mcg. - Refill levothyroxine  25 mcg prescription.  Inflammatory spondylopathy, lumbar region Low back pain managed with Flexeril  and physical therapy. - Continue Flexeril  as needed. - Continue physical therapy.  Osteoporosis and osteopenia, treated Previously treated with Forteo. Bone density improved to osteopenia range. - Continue Evista  for osteoporosis management.  Vitamin D  and B12 deficiency, status post bariatric surgery Vitamin deficiencies due to past bariatric surgery. Vitamin D  was low previously. - Take vitamin D  2000 IU daily over the counter. - Ensure adequate intake of B12 supplements.  Gastroesophageal reflux disease (GERD) Managed with omeprazole . - Continue omeprazole  as needed.  Paroxysmal supraventricular tachycardia (PSVT) Vyvanse  is a stimulant and may increase heart rate. - Monitor heart rate upon resuming Vyvanse .  Hyperlipidemia Requires monitoring. - Order lipid panel before next visit.  Prediabetes A1c was better with Wegovy . Requires monitoring. - Order A1c test before next visit.  General Health Maintenance Emphasized the importance of balanced meals and avoiding ultra-processed foods. - Encourage balanced meals with nutritional value. - Avoid ultra-processed foods. - Promote regular physical activity.  Follow-Up Blood work was last done in October last year. - Order comprehensive lab work including lipids, CBC, comprehensive panel, A1c, vitamin D , B12, and folate before next visit. - Schedule follow-up visit in three months.

## 2023-10-11 ENCOUNTER — Telehealth: Payer: Self-pay | Admitting: Pharmacy Technician

## 2023-10-11 ENCOUNTER — Other Ambulatory Visit (HOSPITAL_COMMUNITY): Payer: Self-pay

## 2023-10-11 ENCOUNTER — Other Ambulatory Visit: Payer: Self-pay

## 2023-10-11 NOTE — Telephone Encounter (Signed)
 Pharmacy Patient Advocate Encounter   Received notification from CoverMyMeds that prior authorization for Lisdexamfetamine Dimesylate  50MG  capsules  is required/requested.   Insurance verification completed.   The patient is insured through [facility] .   Per test claim: PA required; PA submitted to above mentioned insurance via Latent Key/confirmation #/EOC [id number] Status is pending

## 2023-10-11 NOTE — Telephone Encounter (Signed)
 Pharmacy Patient Advocate Encounter  Received notification from [facility] that Prior Authorization for Lisdexamfetamine Dimesylate  50MG  capsules  has been APPROVED from 10/11/23 to 10/10/24. Ran test claim, Copay is $30.00 for 3 months. This test claim was processed through [facility]- copay amounts may vary at other pharmacies due to pharmacy/plan contracts, or as the patient moves through the different stages of their insurance plan.   PA #/Case ID/Reference #: [id number]

## 2023-10-12 ENCOUNTER — Other Ambulatory Visit (HOSPITAL_COMMUNITY): Payer: Self-pay

## 2023-10-26 ENCOUNTER — Other Ambulatory Visit: Payer: Self-pay | Admitting: Family Medicine

## 2023-10-26 DIAGNOSIS — E039 Hypothyroidism, unspecified: Secondary | ICD-10-CM

## 2023-11-12 ENCOUNTER — Encounter: Payer: Self-pay | Admitting: Family Medicine

## 2023-11-22 ENCOUNTER — Other Ambulatory Visit: Payer: Self-pay | Admitting: Family Medicine

## 2023-11-22 DIAGNOSIS — Z1231 Encounter for screening mammogram for malignant neoplasm of breast: Secondary | ICD-10-CM

## 2023-11-25 ENCOUNTER — Encounter: Payer: Self-pay | Admitting: Family Medicine

## 2023-11-27 ENCOUNTER — Encounter: Payer: Self-pay | Admitting: Family Medicine

## 2023-11-27 ENCOUNTER — Ambulatory Visit: Admitting: Family Medicine

## 2023-11-27 VITALS — BP 132/82 | HR 86 | Resp 16 | Ht 63.0 in | Wt 214.8 lb

## 2023-11-27 DIAGNOSIS — M7989 Other specified soft tissue disorders: Secondary | ICD-10-CM

## 2023-11-27 DIAGNOSIS — E039 Hypothyroidism, unspecified: Secondary | ICD-10-CM | POA: Diagnosis not present

## 2023-11-27 DIAGNOSIS — M79674 Pain in right toe(s): Secondary | ICD-10-CM | POA: Diagnosis not present

## 2023-11-27 DIAGNOSIS — G2581 Restless legs syndrome: Secondary | ICD-10-CM

## 2023-11-27 NOTE — Progress Notes (Signed)
 Name: ALASIA ENGE   MRN: [id number]    DOB: 28-Feb-1950   Date:11/27/2023       Progress Note  Subjective  Chief Complaint  Chief Complaint  Patient presents with   Toe Pain    R foot, No injury but pain,warm to the touch, redness,swelling on toe for two weeks    Discussed the use of AI scribe software for clinical note transcription with the patient, who gave verbal consent to proceed.  History of Present Illness Aranza Geddes is a 73 year old female who presents with pain in the distal joint of the second toe.  She has been experiencing pain in the distal joint of her second toe for about one and a half to two weeks. However, moving the joint or applying pressure causes significant pain. She reports that the area was warm earlier and appeared red to her. No trauma to the toe, but she mentions wearing a new pair of tennis shoes around the time the pain started, which she has since stopped wearing.  She has a history of restless legs, which she attributes to stopping Contrave . She describes a need to stretch her legs at night and feels compelled to move them. She is not currently taking any medication for this condition. Her past medical history includes a previous experience with restless legs during cancer treatment, which was associated with abnormal lab results. She has not had recent lab work but is due for it. She has a history of high triglycerides.    Patient Active Problem List   Diagnosis Date Noted   Acquired hypothyroidism 05/28/2023   Mild binge-eating disorder 05/28/2023   Major depression in remission 05/28/2023   Morbid obesity (HCC) 05/28/2023   Osteopenia after menopause 02/23/2022   PSVT (paroxysmal supraventricular tachycardia) 05/03/2018   PVC (premature ventricular contraction) 05/03/2018   PAC (premature atrial contraction) 05/03/2018   Palpitations 02/04/2018   Inflammatory spondylopathy of lumbar region 05/22/2017   Senile purpura 08/11/2016    Facet arthropathy, lumbar 02/02/2016   Spondylolisthesis 01/12/2016   Chronic bilateral low back pain 11/01/2015   Iron deficiency anemia 09/04/2014   Perioral dermatitis 09/04/2014   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   B12 deficiency 07/02/2014   Cervical pain 07/02/2014   Arthralgia of lower leg 07/02/2014   Insomnia 07/02/2014   Depression, major, recurrent, mild (HCC) 07/02/2014   Dyslipidemia 07/02/2014   Dermatitis, eczematoid 07/02/2014   Genital herpes in women 07/02/2014   GERD without esophagitis 07/02/2014   Bariatric surgery status 07/02/2014   Herpes simplex 07/02/2014   H/O: pneumonia 07/02/2014   Intertrigo 07/02/2014   Adult BMI 30+ 07/02/2014   OP (osteoporosis) 07/02/2014   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   Perennial allergic rhinitis with seasonal variation 07/02/2014   Basal cell papilloma 07/02/2014   Vitamin D  deficiency 07/02/2014    Social History   Tobacco Use   Smoking status: Former    Current packs/day: 0.00    Average packs/day: 0.5 packs/day for 5.0 years (2.5 ttl pk-yrs)    Types: Cigarettes    Start date: 01/23/1985    Quit date: 1992    Years since quitting: 33.8   Smokeless tobacco: Never  Substance Use Topics   Alcohol use: Not Currently    Comment: 3 drinks/year     Current Outpatient Medications:    Calcium  Carb-Cholecalciferol (CALCIUM /VITAMIN D  PO), Take by mouth daily., Disp: , Rfl:    cetirizine (ZYRTEC) 10 MG tablet, Take 10 mg by mouth daily as needed. ,  Disp: , Rfl:    Cholecalciferol (VITAMIN D3) 50 MCG (2000 UT) CAPS, Take 1 capsule by mouth daily with breakfast., Disp: , Rfl:    Cyanocobalamin  (B-12) 1000 MCG SUBL, Place 1 tablet under the tongue daily. Every other day, Disp: 45 tablet, Rfl: 1   cyclobenzaprine  (FLEXERIL ) 10 MG tablet, Take 1 tablet (10 mg total) by mouth at bedtime., Disp: 90 tablet, Rfl: 0   escitalopram  (LEXAPRO ) 20 MG tablet, Take 1 tablet (20 mg total) by mouth daily., Disp: 90 tablet, Rfl: 0   fluticasone   (FLONASE ) 50 MCG/ACT nasal spray, SPRAY 2 SPRAYS INTO EACH NOSTRIL EVERY DAY, Disp: 48 mL, Rfl: 1   ketoconazole  (NIZORAL ) 2 % cream, APPLY TO AFFECTED AREA TWICE A DAY FOR RASH, Disp: 90 g, Rfl: 0   levothyroxine  (SYNTHROID ) 25 MCG tablet, Take 1 tablet (25 mcg total) by mouth daily before breakfast., Disp: 90 tablet, Rfl: 0   lisdexamfetamine (VYVANSE ) 50 MG capsule, Take 1 capsule (50 mg total) by mouth daily., Disp: 90 capsule, Rfl: 0   omeprazole  (PRILOSEC) 40 MG capsule, Take 1 capsule (40 mg total) by mouth daily., Disp: 90 capsule, Rfl: 1   PREMARIN  vaginal cream, Place 1 Applicatorful vaginally 3 (three) times a week., Disp: 42.5 g, Rfl: 12   raloxifene  (EVISTA ) 60 MG tablet, TAKE 1 TABLET BY MOUTH EVERY DAY, Disp: 90 tablet, Rfl: 4   triamcinolone  cream (KENALOG ) 0.1 %, Apply 1 application topically 2 (two) times daily. Mix with lotion at home, Disp: 453.6 g, Rfl: 0   valACYclovir  (VALTREX ) 500 MG tablet, Take 1 tablet (500 mg total) by mouth 3 (three) times daily., Disp: 30 tablet, Rfl: 0  Allergies  Allergen Reactions   Topamax  [Topiramate]     severe depression   Trazodone     Other reaction(s): Dizziness    ROS  Ten systems reviewed and is negative except as mentioned in HPI    Objective  Vitals:   11/27/23 1102  BP: 132/82  Pulse: 86  Resp: 16  SpO2: 99%  Weight: 214 lb 12.8 oz (97.4 kg)  Height: 5' 3 (1.6 m)    Body mass index is 38.05 kg/m.   Physical Exam MEASUREMENTS: Weight- 214. CONSTITUTIONAL: Patient appears well-developed and well-nourished. No distress. HEENT: Head atraumatic, normocephalic, neck supple. CARDIOVASCULAR: Normal rate, regular rhythm and normal heart sounds. No murmur heard. No BLE edema. PULMONARY: Effort normal and breath sounds normal. No respiratory distress. ABDOMINAL: There is no tenderness or distention. MUSCULOSKELETAL: Normal gait. Without gross motor or sensory deficit. Pain and redness on palpation of the distal joint of  the second right toe. No increased warmth. Developing hammer toe on the second right toe. PSYCHIATRIC: Patient has a normal mood and affect. Behavior is normal. Judgment and thought content normal.    Assessment & Plan Right second toe pain and possible hammer toe Pain in the distal joint of the right second toe for 1.5 to 2 weeks, exacerbated by movement and pressure. Differential includes arthritis and hammer toe. Previous warmth suggests possible gout, but current symptoms do not strongly indicate gout. - Ordered uric acid level to rule out gout. - If uric acid is high, will treat for gout. - If uric acid is normal, will refer to podiatrist for further evaluation and possible x-rays.  Restless legs syndrome Developed after discontinuation of Contrave . Symptoms include the need to stretch legs at night. Possible contributing factors include anemia, thyroid  dysfunction, or iron deficiency. - Ordered iron studies and thyroid  function  tests. - If labs are normal, will consider starting Requip, starting at a low dose and increasing to 1 mg at night. - If labs indicate anemia or thyroid  dysfunction, will address underlying issue before treating restless legs.  Hypothyroidism Thyroid  function has not been checked since last year. Potential contributor to restless legs syndrome if not well-managed. - Ordered thyroid  function tests.  General Health Maintenance Routine blood work is due. Previous labs showed elevated triglycerides, but fasting status was not confirmed. - Ordered comprehensive blood work excluding cholesterol. - Advised to ignore triglyceride levels if not fasting. - Scheduled follow-up appointment for cholesterol testing.

## 2023-11-28 LAB — LIPID PANEL
Cholesterol: 177 mg/dL (ref <200)
HDL: 40 mg/dL — ABNORMAL LOW (ref >=50)
LDL Cholesterol (Calc): 101 mg/dL — ABNORMAL HIGH
Non-HDL Cholesterol (Calc): 137 mg/dL — ABNORMAL HIGH (ref <130)
Total CHOL/HDL Ratio: 4.4 (calc) (ref <5.0)
Triglycerides: 250 mg/dL — ABNORMAL HIGH (ref <150)

## 2023-11-28 LAB — COMPREHENSIVE METABOLIC PANEL WITH GFR
AG Ratio: 1.2 (calc) (ref 1.0–2.5)
ALT: 19 U/L (ref 6–29)
AST: 17 U/L (ref 10–35)
Albumin: 3.8 g/dL (ref 3.6–5.1)
Alkaline phosphatase (APISO): 123 U/L (ref 37–153)
BUN: 11 mg/dL (ref 7–25)
CO2: 28 mmol/L (ref 20–32)
Calcium: 8.7 mg/dL (ref 8.6–10.4)
Chloride: 104 mmol/L (ref 98–110)
Creat: 0.61 mg/dL (ref 0.60–1.00)
Globulin: 3.1 g/dL (ref 1.9–3.7)
Glucose, Bld: 81 mg/dL (ref 65–99)
Potassium: 4.7 mmol/L (ref 3.5–5.3)
Sodium: 140 mmol/L (ref 135–146)
Total Bilirubin: 0.6 mg/dL (ref 0.2–1.2)
Total Protein: 6.9 g/dL (ref 6.1–8.1)
eGFR: 96 mL/min/1.73m2 (ref >=60)

## 2023-11-28 LAB — CBC WITH DIFFERENTIAL/PLATELET
Absolute Lymphocytes: 1604 {cells}/uL (ref 850–3900)
Absolute Monocytes: 528 {cells}/uL (ref 200–950)
Basophils Absolute: 33 {cells}/uL (ref 0–200)
Basophils Relative: 0.5 %
Eosinophils Absolute: 231 {cells}/uL (ref 15–500)
Eosinophils Relative: 3.5 %
HCT: 44.2 % (ref 35.0–45.0)
Hemoglobin: 14.2 g/dL (ref 11.7–15.5)
MCH: 29.7 pg (ref 27.0–33.0)
MCHC: 32.1 g/dL (ref 32.0–36.0)
MCV: 92.5 fL (ref 80.0–100.0)
MPV: 9.7 fL (ref 7.5–12.5)
Monocytes Relative: 8 %
Neutro Abs: 4204 {cells}/uL (ref 1500–7800)
Neutrophils Relative %: 63.7 %
Platelets: 401 Thousand/uL — ABNORMAL HIGH (ref 140–400)
RBC: 4.78 Million/uL (ref 3.80–5.10)
RDW: 12.6 % (ref 11.0–15.0)
Total Lymphocyte: 24.3 %
WBC: 6.6 Thousand/uL (ref 3.8–10.8)

## 2023-11-28 LAB — IRON,TIBC AND FERRITIN PANEL
%SAT: 23 % (ref 16–45)
Ferritin: 58 ng/mL (ref 16–288)
Iron: 79 ug/dL (ref 45–160)
TIBC: 338 ug/dL (ref 250–450)

## 2023-11-28 LAB — URIC ACID: Uric Acid, Serum: 4.7 mg/dL (ref 2.5–7.0)

## 2023-11-28 LAB — TSH: TSH: 4.58 m[IU]/L — ABNORMAL HIGH (ref 0.40–4.50)

## 2023-11-28 LAB — HEMOGLOBIN A1C
Hgb A1c MFr Bld: 5.5 % (ref <5.7)
Mean Plasma Glucose: 111 mg/dL
eAG (mmol/L): 6.2 mmol/L

## 2023-11-28 LAB — B12 AND FOLATE PANEL
Folate: >24 ng/mL
Vitamin B-12: 682 pg/mL (ref 200–1100)

## 2023-11-28 LAB — VITAMIN D 25 HYDROXY (VIT D DEFICIENCY, FRACTURES): Vit D, 25-Hydroxy: 24 ng/mL — ABNORMAL LOW (ref 30–100)

## 2023-11-29 ENCOUNTER — Encounter: Payer: Self-pay | Admitting: Family Medicine

## 2023-11-29 ENCOUNTER — Ambulatory Visit: Payer: Self-pay | Admitting: Family Medicine

## 2023-11-29 ENCOUNTER — Other Ambulatory Visit: Payer: Self-pay | Admitting: Family Medicine

## 2023-11-29 DIAGNOSIS — M79674 Pain in right toe(s): Secondary | ICD-10-CM

## 2023-11-29 MED ORDER — ROPINIROLE HCL 0.25 MG PO TABS: 0.2500 mg | ORAL_TABLET | Freq: Every day | ORAL | 0 refills | Status: DC

## 2023-12-03 ENCOUNTER — Telehealth: Admitting: Physician Assistant

## 2023-12-03 ENCOUNTER — Ambulatory Visit: Admitting: Podiatry

## 2023-12-03 DIAGNOSIS — R3989 Other symptoms and signs involving the genitourinary system: Secondary | ICD-10-CM

## 2023-12-03 MED ORDER — CEPHALEXIN 500 MG PO CAPS: 500.0000 mg | ORAL_CAPSULE | Freq: Two times a day (BID) | ORAL | 0 refills | Status: DC

## 2023-12-03 NOTE — Progress Notes (Signed)

## 2023-12-05 ENCOUNTER — Encounter: Payer: Self-pay | Admitting: Podiatry

## 2023-12-05 ENCOUNTER — Ambulatory Visit (INDEPENDENT_AMBULATORY_CARE_PROVIDER_SITE_OTHER): Admitting: Podiatry

## 2023-12-05 ENCOUNTER — Ambulatory Visit (INDEPENDENT_AMBULATORY_CARE_PROVIDER_SITE_OTHER)

## 2023-12-05 DIAGNOSIS — M7989 Other specified soft tissue disorders: Secondary | ICD-10-CM

## 2023-12-05 DIAGNOSIS — M722 Plantar fascial fibromatosis: Secondary | ICD-10-CM

## 2023-12-05 DIAGNOSIS — M7751 Other enthesopathy of right foot: Secondary | ICD-10-CM | POA: Diagnosis not present

## 2023-12-05 DIAGNOSIS — M79674 Pain in right toe(s): Secondary | ICD-10-CM

## 2023-12-05 MED ORDER — TRIAMCINOLONE ACETONIDE 10 MG/ML IJ SUSP
10.0000 mg | Freq: Once | INTRAMUSCULAR | Status: AC
  Administered 2023-12-05: 10 mg via INTRA_ARTICULAR

## 2023-12-06 NOTE — Progress Notes (Signed)
 Subjective:   Patient ID: Terri Castillo, female   DOB: 73 y.o.   MRN: [id number]   HPI Patient presents with acute pain of the right second toe without history of injury.  States has been hurting her for a number of weeks slightly better but still very sore at times.  Also history of plantar fasciitis that at this point is doing some better.  Patient does not smoke likes to be active   Review of Systems  All other systems reviewed and are negative.       Objective:  Physical Exam Vitals and nursing note reviewed.  Constitutional:      Appearance: She is well-developed.  Pulmonary:     Effort: Pulmonary effort is normal.  Musculoskeletal:        General: Normal range of motion.  Skin:    General: Skin is warm.  Neurological:     Mental Status: She is alert.     Neurovascular status was found to be intact with the patient noted to have minimal plantar fascial inflammation currently and is noted on the right second digit distal to phalangeal joint to have moderate malalignment inflammation and fluid at the distal to phalangeal joint.  Good digital perfusion well-oriented x 3     Assessment:  Plantar fasciitis under good control right and inflammation pain of the inner phalangeal joint distal second digit right with malalignment     Plan:  H&P reviewed condition and discussed arthritis and capsulitis of the joint along with hammertoe deformity.  At this point going to treat conservatively I did a careful sterile prep and injected the inner phalangeal joint 2 mg dexamethasone Kenalog  2 mg Xylocaine  applied sterile dressing and discussed possible arthroplasty if symptoms persist.  No treatment for plantar fasciitis  X-rays indicate that the joint does have moderate arthritis and moderate malalignment of the distal joint

## 2023-12-21 ENCOUNTER — Other Ambulatory Visit: Payer: Self-pay | Admitting: Family Medicine

## 2023-12-21 DIAGNOSIS — F330 Major depressive disorder, recurrent, mild: Secondary | ICD-10-CM

## 2023-12-22 ENCOUNTER — Other Ambulatory Visit: Payer: Self-pay | Admitting: Family Medicine

## 2023-12-25 NOTE — Telephone Encounter (Signed)
 Requested Prescriptions  Pending Prescriptions Disp Refills   escitalopram  (LEXAPRO ) 20 MG tablet [Pharmacy Med Name: ESCITALOPRAM  20 MG TABLET] 90 tablet 0    Sig: TAKE 1 TABLET BY MOUTH EVERY DAY     Psychiatry:  Antidepressants - SSRI Passed - 12/25/2023  1:20 PM      Passed - Completed PHQ-2 or PHQ-9 in the last 360 days      Passed - Valid encounter within last 6 months    Recent Outpatient Visits           4 weeks ago Pain and swelling of toe of right foot   [facility] [name], MD   2 months ago Morbid obesity [facility])   [name] [name], MD   7 months ago Morbid obesity [facility])   [name] [name], MD   10 months ago Major depression in remission   [facility] Sowles, Krichna, MD

## 2023-12-26 NOTE — Telephone Encounter (Signed)
 Requested Prescriptions  Pending Prescriptions Disp Refills   rOPINIRole  (REQUIP ) 0.25 MG tablet [Pharmacy Med Name: ROPINIROLE  HCL 0.25 MG TABLET] 60 tablet 0    Sig: TAKE 1-2 TABLETS (0.25-0.5 MG TOTAL) BY MOUTH AT BEDTIME.     Neurology:  Parkinsonian Agents Passed - 12/26/2023 12:44 PM      Passed - Last BP in normal range    BP Readings from Last 1 Encounters:  11/27/23 132/82         Passed - Last Heart Rate in normal range    Pulse Readings from Last 1 Encounters:  11/27/23 86         Passed - Valid encounter within last 12 months    Recent Outpatient Visits           4 weeks ago Pain and swelling of toe of right foot   [facility] [name], MD   2 months ago Morbid obesity [facility])   [facility] [name], MD   7 months ago Morbid obesity [facility])   [facility] [name], MD   10 months ago Major depression in remission   [facility] Sowles, Krichna, MD

## 2023-12-27 ENCOUNTER — Encounter: Payer: Self-pay | Admitting: Family Medicine

## 2023-12-28 ENCOUNTER — Ambulatory Visit
Admission: RE | Admit: 2023-12-28 | Discharge: 2023-12-28 | Disposition: A | Source: Ambulatory Visit | Attending: Family Medicine | Admitting: Family Medicine

## 2023-12-28 DIAGNOSIS — Z1231 Encounter for screening mammogram for malignant neoplasm of breast: Secondary | ICD-10-CM

## 2024-01-04 ENCOUNTER — Encounter: Payer: Self-pay | Admitting: Family Medicine

## 2024-01-05 ENCOUNTER — Other Ambulatory Visit: Payer: Self-pay | Admitting: Family Medicine

## 2024-01-05 DIAGNOSIS — E039 Hypothyroidism, unspecified: Secondary | ICD-10-CM

## 2024-01-08 NOTE — Telephone Encounter (Signed)
 Requested Prescriptions  Pending Prescriptions Disp Refills   levothyroxine  (SYNTHROID ) 25 MCG tablet [Pharmacy Med Name: LEVOTHYROXINE  25 MCG TABLET] 90 tablet 0    Sig: TAKE 1 TABLET BY MOUTH DAILY BEFORE BREAKFAST.     Endocrinology:  Hypothyroid Agents Failed - 01/08/2024 11:23 AM      Failed - TSH in normal range and within 360 days    TSH  Date Value Ref Range Status  11/27/2023 4.58 (H) 0.40 - 4.50 mIU/L Final         Passed - Valid encounter within last 12 months    Recent Outpatient Visits           1 month ago Pain and swelling of toe of right foot   [facility] [name], MD   3 months ago Morbid obesity [facility])   [facility] [name], MD   7 months ago Morbid obesity [facility])   [facility] [name], MD   10 months ago Major depression in remission   [facility] Sowles, Krichna, MD

## 2024-01-10 ENCOUNTER — Encounter: Payer: Self-pay | Admitting: Family Medicine

## 2024-01-10 ENCOUNTER — Ambulatory Visit: Admitting: Family Medicine

## 2024-01-10 DIAGNOSIS — K219 Gastro-esophageal reflux disease without esophagitis: Secondary | ICD-10-CM

## 2024-01-10 DIAGNOSIS — E785 Hyperlipidemia, unspecified: Secondary | ICD-10-CM

## 2024-01-10 DIAGNOSIS — E039 Hypothyroidism, unspecified: Secondary | ICD-10-CM

## 2024-01-10 DIAGNOSIS — E538 Deficiency of other specified B group vitamins: Secondary | ICD-10-CM

## 2024-01-10 DIAGNOSIS — M8580 Other specified disorders of bone density and structure, unspecified site: Secondary | ICD-10-CM

## 2024-01-10 DIAGNOSIS — F5081 Binge eating disorder, mild: Secondary | ICD-10-CM

## 2024-01-10 DIAGNOSIS — G2581 Restless legs syndrome: Secondary | ICD-10-CM

## 2024-01-10 DIAGNOSIS — F330 Major depressive disorder, recurrent, mild: Secondary | ICD-10-CM

## 2024-01-10 DIAGNOSIS — I471 Supraventricular tachycardia, unspecified: Secondary | ICD-10-CM

## 2024-01-10 DIAGNOSIS — E559 Vitamin D deficiency, unspecified: Secondary | ICD-10-CM

## 2024-01-10 DIAGNOSIS — Z780 Asymptomatic menopausal state: Secondary | ICD-10-CM

## 2024-01-10 MED ORDER — ESCITALOPRAM OXALATE 20 MG PO TABS: 20.0000 mg | ORAL_TABLET | Freq: Every day | ORAL | 0 refills | Status: DC

## 2024-01-10 MED ORDER — LISDEXAMFETAMINE DIMESYLATE 50 MG PO CAPS: 50.0000 mg | ORAL_CAPSULE | Freq: Every day | ORAL | 0 refills | Status: AC

## 2024-01-10 MED ORDER — ROPINIROLE HCL 0.5 MG PO TABS: 0.5000 mg | ORAL_TABLET | Freq: Every day | ORAL | 0 refills | Status: AC

## 2024-01-10 MED ORDER — OMEPRAZOLE 40 MG PO CPDR: 40.0000 mg | DELAYED_RELEASE_CAPSULE | Freq: Every day | ORAL | 1 refills | Status: AC

## 2024-01-10 MED ORDER — LEVOTHYROXINE SODIUM 25 MCG PO TABS: 25.0000 ug | ORAL_TABLET | Freq: Every day | ORAL | 0 refills | Status: AC

## 2024-01-10 MED ORDER — RALOXIFENE HCL 60 MG PO TABS: 60.0000 mg | ORAL_TABLET | Freq: Every day | ORAL | 4 refills | Status: AC

## 2024-01-10 NOTE — Progress Notes (Signed)
 Name: Terri Castillo   MRN: [id number]    DOB: [birth year]   Date:01/10/2024       Progress Note  Subjective  Chief Complaint  Chief Complaint  Patient presents with   Medical Management of Chronic Issues   Discussed the use of AI scribe software for clinical note transcription with the patient, who gave verbal consent to proceed.  History of Present Illness Terri Castillo is a 73 year old female who presents for a routine follow-up.  She is concerned about her weight management. She previously used a GLP-1 agonist for weight loss but discontinued it due to cost. She is aware of other medications like Qsymia and Contrave  but finds them expensive as well. Her weight has decreased slightly since her last visit. She attributes her weight maintenance to not eating much, although she struggles with nighttime hunger and nibbling, which she describes as 'all in my head.' She does not cook much and often feels hungry at night, which she attributes to insufficient calorie and protein intake during the day. She has a history of binge eating disorder and takes Vyvanse  for that   She is taking levothyroxine  25 micrograms daily for hypothyroidism. Her recent TSH was slightly high, which she attributes to eating soon after taking her medication, affecting absorption. She has adjusted her routine to avoid eating immediately after taking levothyroxine .  She reports low vitamin D  levels and is taking an additional 2000 IU of vitamin D  daily.  Her cholesterol levels are slightly off, with low HDL, high triglycerides, and LDL at 101. She was not fasting during her last lipid panel, and her thyroid  dysfunction may have affected the results. She has a history of dyslipidemia.  She has a history of reflux and takes omeprazole , which she reports is under control. She experiences wheezing in the morning and does not cough at night.  She has paroxysmal ventricular tachycardia but reports no palpitations  or symptoms currently.  She has a history of osteoporosis, which improved to osteopenia after treatment with Forteo. She currently takes Evista  and reports no side effects.  She takes Requip  for restless legs syndrome, experiencing symptoms around 9 PM. She currently takes 0.25 mg but reports needing to stretch her legs and move them.  She is on Lexapro  20 mg for depression and reports increased tearfulness, especially when dealing with her daughter Nicole's health issues. She has tried Wellbutrin, Trazodone , is afraid to try Vraylar since daughter had side effect. 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   Senile purpura 08/11/2016   Facet arthropathy, lumbar 02/02/2016   Spondylolisthesis 01/12/2016   Chronic bilateral low back pain 11/01/2015   Iron deficiency anemia 09/04/2014   Perioral dermatitis 09/04/2014   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   B12 deficiency 07/02/2014   Cervical pain 07/02/2014   Arthralgia of lower leg 07/02/2014   Insomnia 07/02/2014   Depression, major, recurrent, mild (HCC) 07/02/2014   Dyslipidemia 07/02/2014   Dermatitis, eczematoid 07/02/2014   Genital herpes in women  07/02/2014   GERD without esophagitis 07/02/2014   Bariatric surgery status 07/02/2014   Herpes simplex 07/02/2014   H/O: pneumonia 07/02/2014   Intertrigo 07/02/2014   Adult BMI 30+ 07/02/2014   OP (osteoporosis) 07/02/2014   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   Perennial  allergic rhinitis with seasonal variation 07/02/2014   Basal cell papilloma 07/02/2014   Vitamin D  deficiency 07/02/2014    Past Surgical History:  Procedure Laterality Date   ABDOMINAL HYSTERECTOMY  1994   CHOLECYSTECTOMY  2000   COLONOSCOPY     COLONOSCOPY WITH PROPOFOL  N/A 09/14/2022   Procedure: COLONOSCOPY WITH PROPOFOL ;  Surgeon: Therisa Bi, MD;  Location: [facility] ENDOSCOPY;  Service: Gastroenterology;  Laterality: N/A;   EYE SURGERY  2000   Cataract Removal   EYE SURGERY  2002   Cataract Removal   GASTRIC BYPASS  2000   Mini   GASTRIC BYPASS  2010   SMALL INTESTINE SURGERY  2000,2010   TUBAL LIGATION      Family History  Problem Relation Age of Onset   Dementia Mother    COPD Mother    Osteoporosis Mother    Arthritis Mother    Diabetes Father    Glaucoma Father    Parkinson's disease Father    Vision loss Father    Breast cancer Daughter    Brain cancer Daughter        Brain Tumor   Hypothyroidism Daughter    Cancer Daughter        breast   Miscarriages / Stillbirths Daughter    Heart disease Maternal Aunt    Heart disease Maternal Uncle    Glaucoma Brother    Heart Problems Brother    Heart attack Brother 56   Diabetes Paternal Grandfather    Heart disease Brother    Miscarriages / Stillbirths Sister     Social History   Tobacco Use   Smoking status: Former    Current packs/day: 0.00    Average packs/day: 0.5 packs/day for 5.0 years (2.5 ttl pk-yrs)    Types: Cigarettes    Start date: 01/23/1985    Quit date: 1992    Years since quitting: 33.9   Smokeless tobacco: Never  Substance Use Topics   Alcohol use: Not Currently    Comment: 3 drinks/year    Current Medications[1]  Allergies[2]  I personally reviewed active problem list, medication list, allergies, family history with the patient/caregiver today.   ROS  Ten systems reviewed and is negative except as mentioned in HPI    Objective Physical Exam MEASUREMENTS: Weight- 213.48 lbs,  BMI- 37.78. CONSTITUTIONAL: Patient appears well-developed and well-nourished. No distress. HEENT: Head atraumatic, normocephalic, neck supple. CARDIOVASCULAR: Normal rate, regular rhythm and normal heart sounds. No murmur heard. No BLE edema. PULMONARY: Effort normal and breath sounds normal. No respiratory distress. ABDOMINAL: There is no tenderness or distention. MUSCULOSKELETAL: Normal gait. Without gross motor or sensory deficit. PSYCHIATRIC: Patient has a normal mood and affect. Behavior is normal. Judgment and thought content normal.  Vitals:   01/10/24 1051  BP: 118/74  Pulse: 88  Resp: 16  SpO2: 94%  Weight: 213 lb 4.8 oz (96.8 kg)  Height: 5' 3 (1.6 m)    Body mass index is 37.78 kg/m.  Recent Results (from the past 2160 hours)  Lipid panel     Status: Abnormal   Collection Time: 11/27/23 11:32 AM  Result Value Ref Range   Cholesterol 177 <200 mg/dL   HDL 40 (L) > OR = 50 mg/dL  Triglycerides 250 (H) <150 mg/dL    Comment: . If a non-fasting specimen was collected, consider repeat triglyceride testing on a fasting specimen if clinically indicated.  Veatrice et al. J. of Clin. Lipidol. 2015;9:129-169. SABRA    LDL Cholesterol (Calc) 101 (H) mg/dL (calc)    Comment: Reference range: <100 . Desirable range <100 mg/dL for primary prevention;   <70 mg/dL for patients with CHD or diabetic patients  with > or = 2 CHD risk factors. SABRA LDL-C is now calculated using the Martin-Hopkins  calculation, which is a validated novel method providing  better accuracy than the Friedewald equation in the  estimation of LDL-C.  Gladis APPLETHWAITE et al. SANDREA. 7986;689(80): 2061-2068  (http://education.[facility].com/faq/FAQ164)    Total CHOL/HDL Ratio 4.4 <5.0 (calc)   Non-HDL Cholesterol (Calc) 137 (H) <130 mg/dL (calc)    Comment: For patients with diabetes plus 1 major ASCVD risk  factor, treating to a non-HDL-C goal of <100 mg/dL  (LDL-C of <29 mg/dL) is considered a  therapeutic  option.   CBC with Differential/Platelet     Status: Abnormal   Collection Time: 11/27/23 11:32 AM  Result Value Ref Range   WBC 6.6 3.8 - 10.8 Thousand/uL   RBC 4.78 3.80 - 5.10 Million/uL   Hemoglobin 14.2 11.7 - 15.5 g/dL   HCT 55.7 64.9 - 54.9 %   MCV 92.5 80.0 - 100.0 fL   MCH 29.7 27.0 - 33.0 pg   MCHC 32.1 32.0 - 36.0 g/dL    Comment: For adults, a slight decrease in the calculated MCHC value (in the range of 30 to 32 g/dL) is most likely not clinically significant; however, it should be interpreted with caution in correlation with other red cell parameters and the patient's clinical condition.    RDW 12.6 11.0 - 15.0 %   Platelets 401 (H) 140 - 400 Thousand/uL   MPV 9.7 7.5 - 12.5 fL   Neutro Abs 4,204 1,500 - 7,800 cells/uL   Absolute Lymphocytes 1,604 850 - 3,900 cells/uL   Absolute Monocytes 528 200 - 950 cells/uL   Eosinophils Absolute 231 15 - 500 cells/uL   Basophils Absolute 33 0 - 200 cells/uL   Neutrophils Relative % 63.7 %   Total Lymphocyte 24.3 %   Monocytes Relative 8.0 %   Eosinophils Relative 3.5 %   Basophils Relative 0.5 %  Comprehensive metabolic panel with GFR     Status: None   Collection Time: 11/27/23 11:32 AM  Result Value Ref Range   Glucose, Bld 81 65 - 99 mg/dL    Comment: .            Fasting reference interval .    BUN 11 7 - 25 mg/dL   Creat 9.38 9.39 - 8.99 mg/dL   eGFR 96 > OR = 60 fO/fpw/8.26f7   BUN/Creatinine Ratio SEE NOTE: 6 - 22 (calc)    Comment:    Not Reported: BUN and Creatinine are within    reference range. .    Sodium 140 135 - 146 mmol/L   Potassium 4.7 3.5 - 5.3 mmol/L   Chloride 104 98 - 110 mmol/L   CO2 28 20 - 32 mmol/L   Calcium  8.7 8.6 - 10.4 mg/dL   Total Protein 6.9 6.1 - 8.1 g/dL   Albumin 3.8 3.6 - 5.1 g/dL   Globulin 3.1 1.9 - 3.7 g/dL (calc)   AG Ratio 1.2 1.0 - 2.5 (calc)   Total Bilirubin 0.6 0.2 - 1.2 mg/dL  Alkaline phosphatase (APISO) 123 37 - 153 U/L   AST 17 10 - 35 U/L    ALT 19 6 - 29 U/L  Hemoglobin A1c     Status: None   Collection Time: 11/27/23 11:32 AM  Result Value Ref Range   Hgb A1c MFr Bld 5.5 <5.7 %    Comment: For the purpose of screening for the presence of diabetes: . <5.7%       Consistent with the absence of diabetes 5.7-6.4%    Consistent with increased risk for diabetes             (prediabetes) > or =6.5%  Consistent with diabetes . This assay result is consistent with a decreased risk of diabetes. . Currently, no consensus exists regarding use of hemoglobin A1c for diagnosis of diabetes in children. . According to American Diabetes Association (ADA) guidelines, hemoglobin A1c <7.0% represents optimal control in non-pregnant diabetic patients. Different metrics may apply to specific patient populations.  Standards of Medical Care in Diabetes(ADA). .    Mean Plasma Glucose 111 mg/dL   eAG (mmol/L) 6.2 mmol/L  VITAMIN D  25 Hydroxy (Vit-D Deficiency, Fractures)     Status: Abnormal   Collection Time: 11/27/23 11:32 AM  Result Value Ref Range   Vit D, 25-Hydroxy 24 (L) 30 - 100 ng/mL    Comment: Vitamin D  Status         25-OH Vitamin D : . Deficiency:                    <20 ng/mL Insufficiency:             20 - 29 ng/mL Optimal:                 > or = 30 ng/mL . For 25-OH Vitamin D  testing on patients on  D2-supplementation and patients for whom quantitation  of D2 and D3 fractions is required, the [facility]ure[facility](TM) 25-OH VIT D, (D2,D3), LC/MS/MS is recommended: order  code 07111 (patients >36yrs). . See Note 1 . Note 1 . For additional information, please refer to  http://education.[facility].com/faq/FAQ199  (This link is being provided for informational/ educational purposes only.)   B12 and Folate Panel     Status: None   Collection Time: 11/27/23 11:32 AM  Result Value Ref Range   Vitamin B-12 682 200 - 1,100 pg/mL   Folate >24.0 ng/mL    Comment:                            Reference Range                             Low:           <3.4                            Borderline:    3.4-5.4                            Normal:        >5.4 .   Iron, TIBC and Ferritin Panel     Status: None   Collection Time: 11/27/23 11:32 AM  Result Value Ref Range   Iron 79 45 - 160 mcg/dL   TIBC 661 749 - 549 mcg/dL (calc)   %SAT 23 16 -  45 % (calc)   Ferritin 58 16 - 288 ng/mL  Uric acid     Status: None   Collection Time: 11/27/23 11:32 AM  Result Value Ref Range   Uric Acid, Serum 4.7 2.5 - 7.0 mg/dL    Comment: Therapeutic target for gout patients: <6.0 mg/dL .   TSH     Status: Abnormal   Collection Time: 11/27/23 11:32 AM  Result Value Ref Range   TSH 4.58 (H) 0.40 - 4.50 mIU/L     PHQ2/9:    01/10/2024   10:50 AM 11/27/2023   10:58 AM 10/10/2023    3:35 PM 05/28/2023    1:03 PM 02/27/2023    1:10 PM  Depression screen PHQ 2/9  Decreased Interest 0 0 0 0 0  Down, Depressed, Hopeless 0 0 0 0 0  PHQ - 2 Score 0 0 0 0 0  Altered sleeping 0 0 0 0 0  Tired, decreased energy 0 0 0 0 0  Change in appetite 0 0 0 0 0  Feeling bad or failure about yourself  0 0 0 0 0  Trouble concentrating 0 0 0 0 0  Moving slowly or fidgety/restless 0 0 0 0 0  Suicidal thoughts 0 0 0 0 0  PHQ-9 Score 0 0  0  0  0   Difficult doing work/chores Not difficult at all Not difficult at all Not difficult at all Not difficult at all Not difficult at all     Data saved with a previous flowsheet row definition    phq 9 is negative  Fall Risk:    01/10/2024   10:50 AM 11/27/2023   10:58 AM 10/10/2023    3:35 PM 05/28/2023    1:00 PM 02/27/2023    1:10 PM  Fall Risk   Falls in the past year? 0 0 0 0 0  Number falls in past yr: 0 0 0 0 0  Injury with Fall? 0 0  0  0  0   Risk for fall due to : No Fall Risks No Fall Risks No Fall Risks No Fall Risks No Fall Risks  Follow up Falls evaluation completed Falls evaluation completed Falls evaluation completed Falls prevention discussed;Education provided;Falls evaluation  completed Falls prevention discussed;Education provided;Falls evaluation completed     Data saved with a previous flowsheet row definition    Assessment & Plan Morbid obesity BMI over 37 with co-morbidities such as dyslipidemia and GERD. Challenges include nighttime and binge eating. - Consider Rybelsus  for weight loss in January. - Encouraged high-calorie, high-protein snacks. - Advised against skipping meals, suggested salad for lunch. - Recommended chamomile or herbal teas for nighttime hunger.  Mild binge-eating disorder Nighttime and emotional eating impact weight management. - Encouraged behavioral strategies for nighttime eating, such as herbal tea.  Acquired hypothyroidism TSH slightly elevated, possibly due to meal timing affecting levothyroxine  absorption. - Continue levothyroxine  25 mcg daily, add half tablet on Sundays. - Ensure no food intake 30-60 minutes before levothyroxine .  Vitamin D  deficiency Low vitamin D  levels. - Increase vitamin D  supplementation to 2000 IU daily.  Dyslipidemia Low HDL, elevated triglycerides and LDL, possibly affected by thyroid  dysfunction. - Encouraged physical activity and dietary modifications, including fish and tree nuts.  GERD Morning wheezing and regurgitation likely due to reflux. - Take omeprazole  before dinner. - Avoid eating/drinking 3 hours before bedtime. - Consider using a humidifier in the bedroom since she states mouth is dry in am's.  Restless legs syndrome Still  feels the need to stretch legs and move around 9 pm despite current Requip  dose. - Increased Requip  dose to 0.5 mg nightly.  Osteopenia after menopause Bone mass improved post-Forteo, currently on Evista . - Continue Evista  indefinitely.  Mild recurrent major depression Increased tearfulness and stress, Lexapro  may be insufficient. - Discontinued Lexapro , initiated Pristiq  with gradual transition over 1-2 weeks. - Encouraged setting boundaries with  family.  Paroxysmal supraventricular tachycardia - Continue current management without medication.        [1]  Current Outpatient Medications:    Calcium  Carb-Cholecalciferol (CALCIUM /VITAMIN D  PO), Take by mouth daily., Disp: , Rfl:    cephALEXin  (KEFLEX ) 500 MG capsule, Take 1 capsule (500 mg total) by mouth 2 (two) times daily., Disp: 14 capsule, Rfl: 0   cetirizine (ZYRTEC) 10 MG tablet, Take 10 mg by mouth daily as needed. , Disp: , Rfl:    Cholecalciferol (VITAMIN D3) 50 MCG (2000 UT) CAPS, Take 1 capsule by mouth daily with breakfast., Disp: , Rfl:    Cyanocobalamin  (B-12) 1000 MCG SUBL, Place 1 tablet under the tongue daily. Every other day, Disp: 45 tablet, Rfl: 1   cyclobenzaprine  (FLEXERIL ) 10 MG tablet, Take 1 tablet (10 mg total) by mouth at bedtime., Disp: 90 tablet, Rfl: 0   escitalopram  (LEXAPRO ) 20 MG tablet, TAKE 1 TABLET BY MOUTH EVERY DAY, Disp: 90 tablet, Rfl: 0   fluticasone  (FLONASE ) 50 MCG/ACT nasal spray, SPRAY 2 SPRAYS INTO EACH NOSTRIL EVERY DAY, Disp: 48 mL, Rfl: 1   ketoconazole  (NIZORAL ) 2 % cream, APPLY TO AFFECTED AREA TWICE A DAY FOR RASH, Disp: 90 g, Rfl: 0   levothyroxine  (SYNTHROID ) 25 MCG tablet, TAKE 1 TABLET BY MOUTH DAILY BEFORE BREAKFAST., Disp: 90 tablet, Rfl: 0   lisdexamfetamine  (VYVANSE ) 50 MG capsule, Take 1 capsule (50 mg total) by mouth daily., Disp: 90 capsule, Rfl: 0   omeprazole  (PRILOSEC) 40 MG capsule, Take 1 capsule (40 mg total) by mouth daily., Disp: 90 capsule, Rfl: 1   PREMARIN  vaginal cream, Place 1 Applicatorful vaginally 3 (three) times a week., Disp: 42.5 g, Rfl: 12   raloxifene  (EVISTA ) 60 MG tablet, TAKE 1 TABLET BY MOUTH EVERY DAY, Disp: 90 tablet, Rfl: 4   rOPINIRole  (REQUIP ) 0.25 MG tablet, TAKE 1-2 TABLETS (0.25-0.5 MG TOTAL) BY MOUTH AT BEDTIME., Disp: 60 tablet, Rfl: 0   triamcinolone  cream (KENALOG ) 0.1 %, Apply 1 application topically 2 (two) times daily. Mix with lotion at home, Disp: 453.6 g, Rfl: 0   valACYclovir   (VALTREX ) 500 MG tablet, Take 1 tablet (500 mg total) by mouth 3 (three) times daily., Disp: 30 tablet, Rfl: 0 [2]  Allergies Allergen Reactions   Topamax  [Topiramate]     severe depression   Trazodone     Other reaction(s): Dizziness

## 2024-02-21 ENCOUNTER — Other Ambulatory Visit: Payer: Self-pay | Admitting: Family Medicine

## 2024-02-21 DIAGNOSIS — M4696 Unspecified inflammatory spondylopathy, lumbar region: Secondary | ICD-10-CM

## 2024-02-21 NOTE — Telephone Encounter (Signed)
 Requested medication (s) are due for refill today: yes  Requested medication (s) are on the active medication list: yes  Last refill:  10/10/23  Future visit scheduled: yes  Notes to clinic:  Unable to refill per protocol, cannot delegate.      Requested Prescriptions  Pending Prescriptions Disp Refills   cyclobenzaprine  (FLEXERIL ) 10 MG tablet [Pharmacy Med Name: CYCLOBENZAPRINE  10 MG TABLET] 90 tablet 0    Sig: TAKE 1 TABLET BY MOUTH EVERYDAY AT BEDTIME     Not Delegated - Analgesics:  Muscle Relaxants Failed - 02/21/2024  3:57 PM      Failed - This refill cannot be delegated      Passed - Valid encounter within last 6 months    Recent Outpatient Visits           1 month ago Morbid obesity [facility])   [facility] [name], MD   2 months ago Pain and swelling of toe of right foot   [facility] [name], MD   4 months ago Morbid obesity [facility])   [place] [facility] [name], MD   8 months ago Morbid obesity [facility])   [place] [facility] [name], MD   11 months ago Major depression in remission   [facility] Sowles, Krichna, MD

## 2024-04-10 ENCOUNTER — Ambulatory Visit: Admitting: Family Medicine
